# Patient Record
Sex: Male | Born: 1945 | Race: White | Hispanic: No | Marital: Married | Smoking: Former smoker
Health system: Southern US, Community
[De-identification: ages and names within clinical notes are randomized; demographics above are authoritative.]

## PROBLEM LIST (undated history)

## (undated) DIAGNOSIS — K219 Gastro-esophageal reflux disease without esophagitis: Secondary | ICD-10-CM

## (undated) DIAGNOSIS — M1990 Unspecified osteoarthritis, unspecified site: Secondary | ICD-10-CM

## (undated) DIAGNOSIS — S46311A Strain of muscle, fascia and tendon of triceps, right arm, initial encounter: Secondary | ICD-10-CM

## (undated) DIAGNOSIS — C61 Malignant neoplasm of prostate: Secondary | ICD-10-CM

## (undated) DIAGNOSIS — I10 Essential (primary) hypertension: Secondary | ICD-10-CM

## (undated) DIAGNOSIS — F419 Anxiety disorder, unspecified: Secondary | ICD-10-CM

## (undated) HISTORY — PX: PROSTATE SURGERY: SHX751

## (undated) HISTORY — PX: KNEE ARTHROSCOPY: SUR90

## (undated) HISTORY — PX: TONSILLECTOMY: SUR1361

## (undated) HISTORY — PX: NASAL SINUS SURGERY: SHX719

---

## 2003-06-20 ENCOUNTER — Other Ambulatory Visit: Admission: RE | Admit: 2003-06-20 | Discharge: 2003-06-20 | Payer: Self-pay | Admitting: Dermatology

## 2008-02-05 ENCOUNTER — Encounter (INDEPENDENT_AMBULATORY_CARE_PROVIDER_SITE_OTHER): Payer: Self-pay | Admitting: Urology

## 2011-02-28 ENCOUNTER — Emergency Department (HOSPITAL_COMMUNITY): Payer: Medicare Other

## 2011-02-28 ENCOUNTER — Emergency Department (HOSPITAL_COMMUNITY)
Admission: EM | Admit: 2011-02-28 | Discharge: 2011-02-28 | Disposition: A | Discharge status: Home / Self Care | Payer: Medicare Other | Attending: Emergency Medicine | Admitting: Emergency Medicine

## 2011-02-28 DIAGNOSIS — Y92009 Unspecified place in unspecified non-institutional (private) residence as the place of occurrence of the external cause: Secondary | ICD-10-CM | POA: Insufficient documentation

## 2011-02-28 DIAGNOSIS — I10 Essential (primary) hypertension: Secondary | ICD-10-CM | POA: Insufficient documentation

## 2011-02-28 DIAGNOSIS — S93609A Unspecified sprain of unspecified foot, initial encounter: Secondary | ICD-10-CM | POA: Insufficient documentation

## 2011-02-28 DIAGNOSIS — S93409A Sprain of unspecified ligament of unspecified ankle, initial encounter: Secondary | ICD-10-CM | POA: Insufficient documentation

## 2011-02-28 DIAGNOSIS — W208XXA Other cause of strike by thrown, projected or falling object, initial encounter: Secondary | ICD-10-CM | POA: Insufficient documentation

## 2011-12-16 ENCOUNTER — Ambulatory Visit
Admission: RE | Admit: 2011-12-16 | Discharge: 2011-12-16 | Disposition: A | Discharge status: Home / Self Care | Payer: Medicare Other | Source: Ambulatory Visit | Attending: Neurology | Admitting: Neurology

## 2011-12-16 ENCOUNTER — Other Ambulatory Visit: Payer: Self-pay | Admitting: Neurology

## 2011-12-16 DIAGNOSIS — R52 Pain, unspecified: Secondary | ICD-10-CM

## 2012-01-27 ENCOUNTER — Emergency Department (HOSPITAL_COMMUNITY): Payer: Medicare Other

## 2012-01-27 ENCOUNTER — Other Ambulatory Visit: Payer: Self-pay

## 2012-01-27 ENCOUNTER — Emergency Department (HOSPITAL_COMMUNITY)
Admission: EM | Admit: 2012-01-27 | Discharge: 2012-01-27 | Disposition: A | Discharge status: Home / Self Care | Payer: Medicare Other | Attending: Emergency Medicine | Admitting: Emergency Medicine

## 2012-01-27 ENCOUNTER — Encounter (HOSPITAL_COMMUNITY): Payer: Self-pay

## 2012-01-27 DIAGNOSIS — R0602 Shortness of breath: Secondary | ICD-10-CM | POA: Insufficient documentation

## 2012-01-27 DIAGNOSIS — J984 Other disorders of lung: Secondary | ICD-10-CM | POA: Insufficient documentation

## 2012-01-27 DIAGNOSIS — J45909 Unspecified asthma, uncomplicated: Secondary | ICD-10-CM | POA: Insufficient documentation

## 2012-01-27 DIAGNOSIS — R0609 Other forms of dyspnea: Secondary | ICD-10-CM | POA: Insufficient documentation

## 2012-01-27 DIAGNOSIS — I10 Essential (primary) hypertension: Secondary | ICD-10-CM | POA: Insufficient documentation

## 2012-01-27 DIAGNOSIS — R0600 Dyspnea, unspecified: Secondary | ICD-10-CM

## 2012-01-27 DIAGNOSIS — R0989 Other specified symptoms and signs involving the circulatory and respiratory systems: Secondary | ICD-10-CM | POA: Insufficient documentation

## 2012-01-27 HISTORY — DX: Essential (primary) hypertension: I10

## 2012-01-27 HISTORY — DX: Malignant neoplasm of prostate: C61

## 2012-01-27 LAB — BLOOD GAS, ARTERIAL
Bicarbonate: 23.4 mEq/L (ref 20.0–24.0)
O2 Saturation: 95.9 %
pH, Arterial: 7.443 (ref 7.350–7.450)
pO2, Arterial: 81.6 mmHg (ref 80.0–100.0)

## 2012-01-27 LAB — DIFFERENTIAL
Eosinophils Relative: 2 % (ref 0–5)
Lymphocytes Relative: 30 % (ref 12–46)
Lymphs Abs: 2.7 10*3/uL (ref 0.7–4.0)
Monocytes Relative: 5 % (ref 3–12)

## 2012-01-27 LAB — BASIC METABOLIC PANEL
CO2: 26 mEq/L (ref 19–32)
Calcium: 9.4 mg/dL (ref 8.4–10.5)
Glucose, Bld: 107 mg/dL — ABNORMAL HIGH (ref 70–99)
Sodium: 134 mEq/L — ABNORMAL LOW (ref 135–145)

## 2012-01-27 LAB — TROPONIN I: Troponin I: <0.3 ng/mL (ref <0.30)

## 2012-01-27 LAB — URINALYSIS, ROUTINE W REFLEX MICROSCOPIC
Glucose, UA: NEGATIVE mg/dL
Hgb urine dipstick: NEGATIVE
Specific Gravity, Urine: 1.015 (ref 1.005–1.030)
pH: 6 (ref 5.0–8.0)

## 2012-01-27 LAB — CBC
HCT: 42.5 % (ref 39.0–52.0)
Hemoglobin: 14.6 g/dL (ref 13.0–17.0)
MCV: 92.8 fL (ref 78.0–100.0)
Platelets: 338 10*3/uL (ref 150–400)
RBC: 4.58 MIL/uL (ref 4.22–5.81)
WBC: 8.8 10*3/uL (ref 4.0–10.5)

## 2012-01-27 MED ORDER — SODIUM CHLORIDE 0.9 % IV SOLN
INTRAVENOUS | Status: DC
  Administered 2012-01-27: 18:00:00 via INTRAVENOUS

## 2012-01-27 MED ORDER — IPRATROPIUM BROMIDE 0.02 % IN SOLN
0.5000 mg | Freq: Once | RESPIRATORY_TRACT | Status: DC
  Filled 2012-01-27: qty 2.5

## 2012-01-27 MED ORDER — ALBUTEROL SULFATE (5 MG/ML) 0.5% IN NEBU
5.0000 mg | INHALATION_SOLUTION | Freq: Once | RESPIRATORY_TRACT | Status: DC
  Filled 2012-01-27: qty 1

## 2012-01-27 NOTE — ED Notes (Signed)
Started prednisone last week, has since stopped taking, thinks may his breathing worse.  "just not felt right all weekend", denies any n/v, no cp, denies cough/fever

## 2012-01-27 NOTE — ED Notes (Signed)
Ambulated pt around nurses station, lowest O2 level was 98%.

## 2012-01-27 NOTE — ED Notes (Signed)
Received report from Alfredo Martinez RN

## 2012-01-27 NOTE — ED Notes (Signed)
Discharge instructions reviewed with pt, questions answered. Pt verbalized understanding.  

## 2012-01-27 NOTE — ED Provider Notes (Signed)
History     CSN: [id number]  Arrival date & time 01/27/12  1743   First MD Initiated Contact with Patient 01/27/12 1801      Chief Complaint  Patient presents with  . Shortness of Breath     HPI Pt was seen at 1805.  Per pt, c/o gradual onset and persistence of constant SOB for the past several days.  Pt describes the SOB as "I feel like I can't take a deep breath" and "I just don't feel right."  Denies specific SOB however.  Pt states he had been taking prednisone for the past week and this symptom began after he stopped it (just before the weekend).  Denies CP/palpitations, no cough, no abd pain, no back pain, no rash, no fevers, no calf/LE pain or unilateral swelling, no visual changes, no focal motor weakness, no tingling/numbness in extremities.       Past Medical History  Diagnosis Date  . Hypertension   . Asthma   . Prostate ca     Past Surgical History  Procedure Date  . Prostate surgery     History  Substance Use Topics  . Smoking status: Never Smoker   . Smokeless tobacco: Not on file  . Alcohol Use: Yes     occ    Review of Systems ROS: Statement: All systems negative except as marked or noted in the HPI; Constitutional: Negative for fever and chills. ; ; Eyes: Negative for eye pain, redness and discharge. ; ; ENMT: Negative for ear pain, hoarseness, nasal congestion, sinus pressure and sore throat. ; ; Cardiovascular: +SOB. Negative for chest pain, palpitations, diaphoresis, and peripheral edema. ; ; Respiratory: Negative for cough, wheezing and stridor. ; ; Gastrointestinal: Negative for nausea, vomiting, diarrhea, abdominal pain, blood in stool, hematemesis, jaundice and rectal bleeding. . ; ; Genitourinary: Negative for dysuria, flank pain and hematuria. ; ; Musculoskeletal: Negative for back pain and neck pain. Negative for swelling and trauma.; ; Skin: Negative for pruritus, rash, abrasions, blisters, bruising and skin lesion.; ; Neuro: Negative for headache,  lightheadedness and neck stiffness. Negative for weakness, altered level of consciousness , altered mental status, extremity weakness, paresthesias, involuntary movement, seizure and syncope.     Allergies  Penicillins  Home Medications  No current outpatient prescriptions on file.  BP 146/78  Pulse 79  Temp(Src) 97.4 F (36.3 C) (Oral)  Resp 20  Ht 5\' 11"  (1.803 m)  Wt 210 lb (95.255 kg)  BMI 29.29 kg/m2  SpO2 98%  Physical Exam 1810: Physical examination:  Nursing notes reviewed; Vital signs and O2 SAT reviewed;  Constitutional: Well developed, Well nourished, Well hydrated, In no acute distress; Head:  Normocephalic, atraumatic; Eyes: EOMI, PERRL, No scleral icterus; ENMT: Mouth and pharynx normal, Mucous membranes moist; Neck: Supple, Full range of motion, No lymphadenopathy; Cardiovascular: Regular rate and rhythm, No murmur, rub, or gallop; Respiratory: Breath sounds clear & equal bilaterally, rare wheeze, no audible wheezing, speaking full sentences with ease. Normal respiratory effort/excursion; Chest: Nontender, Movement normal; Abdomen: Soft, Nontender, Nondistended, Normal bowel sounds; Genitourinary: No CVA tenderness; Extremities: Pulses normal, No tenderness, No edema, No calf edema or asymmetry.; Neuro: AA&Ox3, Major CN grossly intact. Speech clear, no facial droop, normal coordination, gait steady. No gross focal motor or sensory deficits in extremities.; Skin: Color normal, Warm, Dry, no rash.    ED Course  Procedures    MDM  MDM Reviewed: nursing note and vitals Interpretation: ECG, labs, x-ray and CT scan  Date: 01/27/2012  Rate: 70  Rhythm: normal sinus rhythm and sinus arrhythmia  QRS Axis: left  Intervals: normal  ST/T Wave abnormalities: normal  Conduction Disutrbances:right bundle branch block  Narrative Interpretation:   Old EKG Reviewed: none available.  Results for orders placed during the hospital encounter of 01/27/12  TROPONIN I       Component Value Range   Troponin I <0.30  <0.30 (ng/mL)  URINALYSIS, ROUTINE W REFLEX MICROSCOPIC      Component Value Range   Color, Urine YELLOW  YELLOW    APPearance CLEAR  CLEAR    Specific Gravity, Urine 1.015  1.005 - 1.030    pH 6.0  5.0 - 8.0    Glucose, UA NEGATIVE  NEGATIVE (mg/dL)   Hgb urine dipstick NEGATIVE  NEGATIVE    Bilirubin Urine NEGATIVE  NEGATIVE    Ketones, ur NEGATIVE  NEGATIVE (mg/dL)   Protein, ur NEGATIVE  NEGATIVE (mg/dL)   Urobilinogen, UA 0.2  0.0 - 1.0 (mg/dL)   Nitrite NEGATIVE  NEGATIVE    Leukocytes, UA NEGATIVE  NEGATIVE   CBC      Component Value Range   WBC 8.8  4.0 - 10.5 (K/uL)   RBC 4.58  4.22 - 5.81 (MIL/uL)   Hemoglobin 14.6  13.0 - 17.0 (g/dL)   HCT 04.5  40.9 - 81.1 (%)   MCV 92.8  78.0 - 100.0 (fL)   MCH 31.9  26.0 - 34.0 (pg)   MCHC 34.4  30.0 - 36.0 (g/dL)   RDW 91.4  78.2 - 95.6 (%)   Platelets 338  150 - 400 (K/uL)  DIFFERENTIAL      Component Value Range   Neutrophils Relative 63  43 - 77 (%)   Neutro Abs 5.5  1.7 - 7.7 (K/uL)   Lymphocytes Relative 30  12 - 46 (%)   Lymphs Abs 2.7  0.7 - 4.0 (K/uL)   Monocytes Relative 5  3 - 12 (%)   Monocytes Absolute 0.4  0.1 - 1.0 (K/uL)   Eosinophils Relative 2  0 - 5 (%)   Eosinophils Absolute 0.2  0.0 - 0.7 (K/uL)   Basophils Relative 0  0 - 1 (%)   Basophils Absolute 0.0  0.0 - 0.1 (K/uL)  BASIC METABOLIC PANEL      Component Value Range   Sodium 134 (*) 135 - 145 (mEq/L)   Potassium 4.2  3.5 - 5.1 (mEq/L)   Chloride 99  96 - 112 (mEq/L)   CO2 26  19 - 32 (mEq/L)   Glucose, Bld 107 (*) 70 - 99 (mg/dL)   BUN 22  6 - 23 (mg/dL)   Creatinine, Ser 2.13  0.50 - 1.35 (mg/dL)   Calcium 9.4  8.4 - 08.6 (mg/dL)   GFR calc non Af Amer 68 (*) >90 (mL/min)   GFR calc Af Amer 79 (*) >90 (mL/min)  D-DIMER, QUANTITATIVE      Component Value Range   D-Dimer, Quant 0.23  0.00 - 0.48 (ug/mL-FEU)  BLOOD GAS, ARTERIAL      Component Value Range   Delivery systems ROOM AIR     pH, Arterial  7.443  7.350 - 7.450    pCO2 arterial 34.8 (*) 35.0 - 45.0 (mmHg)   pO2, Arterial 81.6  80.0 - 100.0 (mmHg)   Bicarbonate 23.4  20.0 - 24.0 (mEq/L)   TCO2 20.0  0 - 100 (mmol/L)   Acid-base deficit 0.2  0.0 - 2.0 (mmol/L)   O2 Saturation 95.9  Patient temperature 37.0     Collection site RIGHT RADIAL     Drawn by [id number]     Sample type ARTERIAL     Allens test (pass/fail) PASS  PASS    Dg Chest 2 View 01/27/2012  *RADIOLOGY REPORT*  Clinical Data: Shortness of breath.  CHEST - 2 VIEW  Comparison: None.  Findings: Lungs are hyperexpanded. Interstitial markings are diffusely coarsened with chronic features.  Biapical pleural thickening is symmetric, likely related to scarring. Tiny nodular densities seen in the right costophrenic angle.  Cardiopericardial silhouette is at upper limits of normal for size. Bones are diffusely demineralized. Telemetry leads overlie the chest.  IMPRESSION: Chronic interstitial changes with biapical pleuroparenchymal scarring.  Tiny nodular density in the right cardiophrenic angle.  This may be related to underlying interstitial disease or scarring. CT chest without contrast could be used to further evaluate.  Original Report Authenticated By: ERIC A. MANSELL, M.D.   Ct Chest Wo Contrast 01/27/2012  *RADIOLOGY REPORT*  Clinical Data: Shortness of breath.  Prostate cancer. Nodule in the right lung base laterally on chest x-ray.  CT CHEST WITHOUT CONTRAST  Technique:  Multidetector CT imaging of the chest was performed following the standard protocol without IV contrast.  Comparison: Chest x-ray dated 01/27/2012  Findings: The lungs are clear.  The density seen at the right lung base laterally on the chest x-ray represents a sclerotic lesion in the lateral aspect of the right seventh rib.  This is seen on image number 50 of series 3.  There is also a sclerotic lesion in the lateral aspect of the right fourth rib.  These may represent benign bone islands but in this patient  with prostate cancer history, the possibility of sclerotic metastases should be considered.  The patient has a small posterior medial diaphragmatic hernia containing only fat at the left lung base posteriorly.  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  IMPRESSION:  1.  No abnormality of the lungs. 2.  Sclerotic lesions in the right fourth and seventh ribs.  The seventh rib lesion accounts for the nodular appearing density on chest x-ray.  These probably represent benign bone islands but in this patient with prostate cancer history, sclerotic metastases could give the same appearance.  Original Report Authenticated By: Gwynn Burly, M.D.      11:18 PM:  Pt states he feels "much better now" after neb tx, has ambulated in the ED with Sats remaining 98-99% R/A, resps easy, gait steady.  VS remain stable, no hypoxia, no tachypnea, no tachycardia.  Denies SOB/CP/abd pain/back pain specifically.  No calf/LE pain or unilateral swelling.  Pt is breathing full and deep while in the ED, lungs CTA bilat, NAD, speaking full sentences with ease.  Doubt PE or ACS as cause for symptoms given constant symptoms for the past several days with normal troponin, d-dimer and without acute STTW changes on EKG.  Wells Criteria scores low prob for PE.  Pt has hx of asthma and states he feels "a lot better now" after 1 nebulizer treatment, he wants to go home and he does not want any further testing in the ED tonight.  Wife wants to take pt home.  D/W pt and wife re: CT findings above.  Endorses he already has an appt with his Prostate CA doctor at [facility] this month to be scheduled for a NM bone scan for this year (they have been negative to date).  States he can call  this doctor tomorrow with CT results and attempt to get the bone scan moved up to further delineate the rib/bone lesions.  Dx testing d/w pt and family.  Questions answered.  Verb understanding, agreeable to d/c home with outpt f/u.             Laray Anger, DO 01/30/12 1357

## 2012-01-27 NOTE — Discharge Instructions (Signed)
RESOURCE GUIDE  Dental Problems  Patients with Medicaid: [facility]                     [facility] [address]                                           [address] Phone:  [phone]                                                  Phone:  [phone]  If unable to pay or uninsured, contact:  [facility] or [facility] to become qualified for the adult dental clinic.  Chronic Pain Problems Contact [facility]  [phone] Patients need to be referred by their primary care doctor.  Insufficient Money for Medicine Contact [employer]:  call "211" or [facility] 271-5999.  No Primary Care Doctor Call [facility]  832-8000 Other agencies that provide inexpensive medical care    [facility]  [phone]    [facility]  [phone]    [facility]  [phone]    Women's Clinic  [phone]    [facility]  [phone]    [facility]  [phone]  Psychological Services [facility]  [phone] [facility]  [phone] [facility]   [phone] (emergency services [phone])  Substance Abuse Resources [facility]  336-882-2125 [facility] 336-784-9470 [facility] 336-285-9073 [facility] 336-845-3988 Residential & Outpatient Substance Abuse Program  800-659-3381  Abuse/Neglect [place] Child Abuse Hotline (336) 641-3795 [place] Child Abuse Hotline 800-378-5315 (After Hours)  Emergency Shelter [facility] (336) 271-5985  Maternity Homes [facility] (336) 275-9566 [facility] (704) 372-4663  MRSA Hotline #:   832-7006    [place] [facility]  [facility] [place]     [employer]                          [facility] [address] [place]                       [address]      [address]  [place]                                                [place]                            [place] Phone:  [phone]                                   Phone:  [phone]                 Phone:  [phone]  [facility] Phone:  [phone]    [facility] Child Abuse Hotline 574-611-6536 708-026-8184 (After Hours)    Take your usual prescriptions as previously directed.  Your chest CT scan showed:  "Sclerotic lesions in the right fourth and seventh ribs.  These probably represent benign bone islands but in this patient with prostate cancer history, sclerotic metastases could give the same appearance."   Call your regular medical doctor and your Prostate doctor at [facility] tomorrow morning to schedule a follow up appointment this week to follow up on your CT scan findings.  Return to the Emergency Department immediately sooner if worsening.

## 2012-01-28 LAB — URINE CULTURE

## 2012-02-13 DIAGNOSIS — N529 Male erectile dysfunction, unspecified: Secondary | ICD-10-CM | POA: Insufficient documentation

## 2012-02-13 DIAGNOSIS — N393 Stress incontinence (female) (male): Secondary | ICD-10-CM | POA: Insufficient documentation

## 2012-02-13 DIAGNOSIS — C61 Malignant neoplasm of prostate: Secondary | ICD-10-CM | POA: Insufficient documentation

## 2012-02-16 ENCOUNTER — Encounter (HOSPITAL_COMMUNITY): Payer: Self-pay

## 2012-02-16 ENCOUNTER — Emergency Department (HOSPITAL_COMMUNITY)
Admission: EM | Admit: 2012-02-16 | Discharge: 2012-02-16 | Disposition: A | Discharge status: Home / Self Care | Payer: Medicare Other | Attending: Emergency Medicine | Admitting: Emergency Medicine

## 2012-02-16 DIAGNOSIS — Z880 Allergy status to penicillin: Secondary | ICD-10-CM | POA: Insufficient documentation

## 2012-02-16 DIAGNOSIS — H5710 Ocular pain, unspecified eye: Secondary | ICD-10-CM | POA: Insufficient documentation

## 2012-02-16 DIAGNOSIS — J45909 Unspecified asthma, uncomplicated: Secondary | ICD-10-CM | POA: Insufficient documentation

## 2012-02-16 DIAGNOSIS — I10 Essential (primary) hypertension: Secondary | ICD-10-CM | POA: Insufficient documentation

## 2012-02-16 DIAGNOSIS — S058X9A Other injuries of unspecified eye and orbit, initial encounter: Secondary | ICD-10-CM

## 2012-02-16 DIAGNOSIS — Z8546 Personal history of malignant neoplasm of prostate: Secondary | ICD-10-CM | POA: Insufficient documentation

## 2012-02-16 MED ORDER — TETRACAINE HCL 0.5 % OP SOLN
OPHTHALMIC | Status: AC
  Administered 2012-02-16: 09:00:00
  Filled 2012-02-16: qty 2

## 2012-02-16 MED ORDER — FLUORESCEIN SODIUM 1 MG OP STRP
ORAL_STRIP | OPHTHALMIC | Status: AC
  Administered 2012-02-16: 09:00:00
  Filled 2012-02-16: qty 1

## 2012-02-16 NOTE — Discharge Instructions (Signed)
Eye Injury   The eye can be injured by scratches, foreign bodies, contact lenses, very bright light (welding torches), and chemical irritation. The cornea (the clear part of the eye) is very sensitive; even minor injuries to it are painful. Most injuries to the cornea heal in 2-4 days. Treatment may include:   Antibiotic eye drops or ointment may be needed to soothe the eye and prevent infection. Drops that numb the eye (anesthetic drops) should not be used repeatedly as they can delay healing. Drops to dilate the pupil for 1-2 days are sometimes used to relieve pain.   Patching the eye can reduce irritation from blinking and bright light. When your eye is patched, you should not drive or operate machinery because your side vision and your ability to judge distances are decreased.   Rest your eye. Stay in a darkened room and wear sunglasses to reduce the irritation from light.   Do not rub your eye for the next 2 weeks to allow complete healing.  If you have contact lenses, do not wear them until your caregiver says it is safe to do so. Pain medicine may also be needed for 1-2 days.   SEEK MEDICAL CARE IF:   You have increased pain, persistent irritation or blurred vision over the next 2 days.   Your symptoms are getting worse or not improving.   You have any other questions or concerns regarding your injury.  Document Released: 12/19/2004 Document Revised: 10/31/2011 Document Reviewed: 11/11/2005   ExitCare Patient Information 2012 [employer].

## 2012-02-16 NOTE — ED Provider Notes (Signed)
History   This chart was scribed for Roberto Gaskins, MD by Melba Coon. The patient was seen in room APA17/APA17 and the patient's care was started at 8:55AM.    CSN: [id number]  Arrival date & time 02/16/12  0847   First MD Initiated Contact with Patient 02/16/12 [phone]      Chief complaint - eye pain   HPI ZLATAN HORNBACK is a 66 y.o. male who presents to the Emergency Department complaining of constant, burning, moderate to severe right orbital pain with an onset 45 minutes ago. Pt was getting ready for church when he accidentally put 1 drop of earwax removal in his eye instead of eye drops. Pt stated it immediately started to burn and tried to flush his eye out with water. Pt does not wear contacts. Blurriness present. No HA, fever, chills, CP, or n/v/d. No Hx of eye surgeries. Hx of HTN and asthma. No other pertinent medical problems.  Past Medical History  Diagnosis Date  . Hypertension   . Asthma   . Prostate ca     Past Surgical History  Procedure Date  . Prostate surgery     No family history on file.  History  Substance Use Topics  . Smoking status: Never Smoker   . Smokeless tobacco: Not on file  . Alcohol Use: Yes     occ      Review of Systems 10 Systems reviewed and are negative for acute change except as noted in the HPI.  Allergies  Penicillins  Home Medications   Current Outpatient Rx  Name Route Sig Dispense Refill  . ALPRAZOLAM 0.5 MG PO TABS Oral Take 0.5 mg by mouth 2 (two) times daily as needed. Nerves/sleep    . AZELASTINE HCL 137 MCG/SPRAY NA SOLN Nasal Place 1 spray into the nose 2 (two) times daily. Use in each nostril as directed    . BENAZEPRIL HCL 20 MG PO TABS Oral Take 20 mg by mouth daily.    . COLESEVELAM HCL 625 MG PO TABS Oral Take 1,875 mg by mouth 2 (two) times daily.    Marland Kitchen FLUTICASONE-SALMETEROL 250-50 MCG/DOSE IN AEPB Inhalation Inhale 1 puff into the lungs every 12 (twelve) hours.    Marland Kitchen HYDROCODONE-ACETAMINOPHEN 5-500 MG  PO TABS Oral Take 1 tablet by mouth every 8 (eight) hours as needed. Pain patient taking for 10 days    . MELOXICAM 15 MG PO TABS Oral Take 15 mg by mouth daily.      BP 146/85  Pulse 80  Resp 20  Ht 5\' 11"  (1.803 m)  Wt 210 lb (95.255 kg)  BMI 29.29 kg/m2   Physical Exam  CONSTITUTIONAL: Well developed/well nourished HEAD AND FACE: Normocephalic/atraumatic EYES: EOMI, right eye: conjectival erythema w/ whitish d/c.  Right pupil is reactive/regular in appearance No abrasion.  PH equal to OS (between 7-8).  No foreign body noted.  No hyphema/hypopyon.  No consensual pain noted.  ENMT: Mucous membranes moist NECK: supple no meningeal signs SPINE:entire spine nontender CV: S1/S2 noted, no murmurs/rubs/gallops noted LUNGS: Lungs are clear to auscultation bilaterally, no apparent distress ABDOMEN: soft, nontender, no rebound or guarding GU:no cva tenderness NEURO: Pt is awake/alert, moves all extremitiesx4 EXTREMITIES: pulses normal, full ROM SKIN: warm, color normal PSYCH: no abnormalities of mood noted   ED Course  Procedures  COORDINATION OF CARE:  9:00AM - EDMD will determine if there are any abrasions in the eye; will also flush the eye out with morgan lens and determine  what chemicals were in the earwax removal.   9:45AM - recheck; EDMD performs an eye exam Pt improved D/w dr Lita Mains, on call for eye, likely had peroxide component, no further testing/treatment necessary     MDM  Nursing notes reviewed and considered in documentation    I personally performed the services described in this documentation, which was scribed in my presence. The recorded information has been reviewed and considered.          Roberto Gaskins, MD 02/16/12 1004

## 2012-02-16 NOTE — ED Notes (Signed)
Pt states he got ear wax remover in his eye.

## 2013-01-24 IMAGING — CR DG CHEST 2V
2 series · 2 of 2 positions shown · non-contrast
Comparison: None.

CLINICAL DATA: Shortness of breath.

CHEST - 2 VIEW

[view not recorded (1 of 2)]
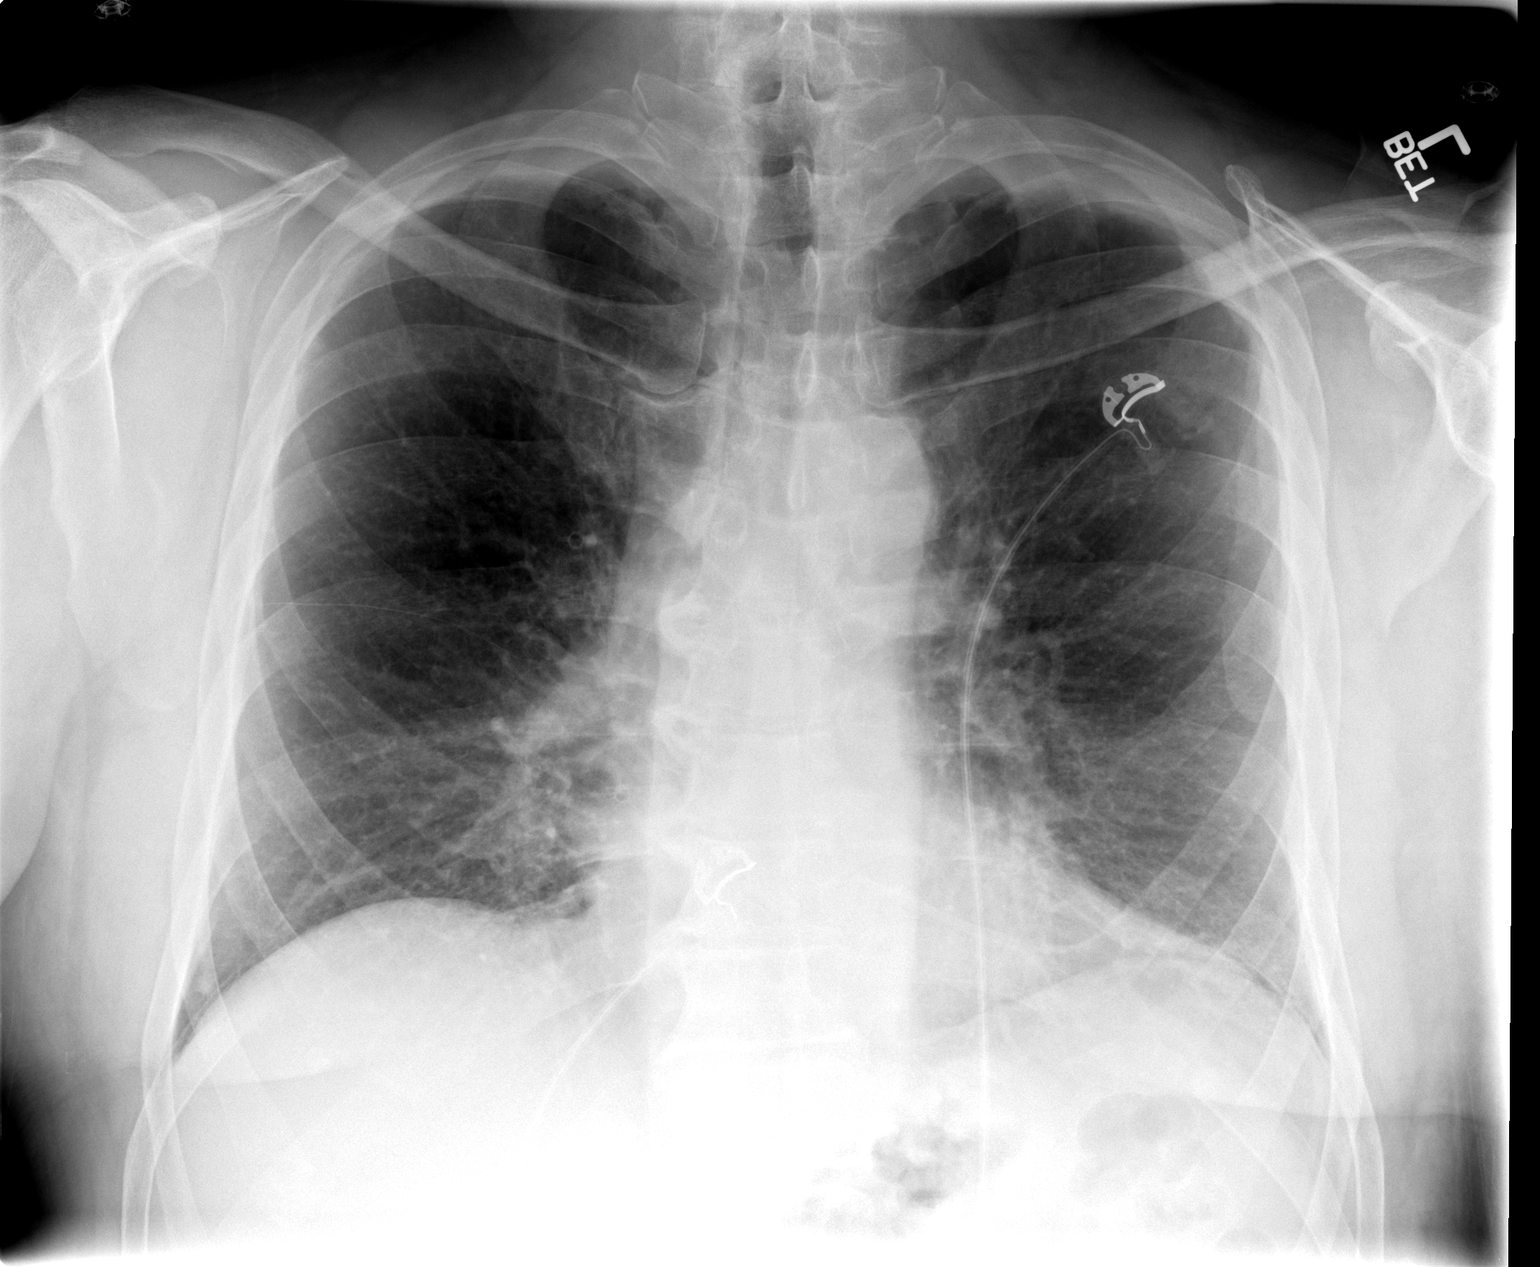

[view not recorded (2 of 2)]
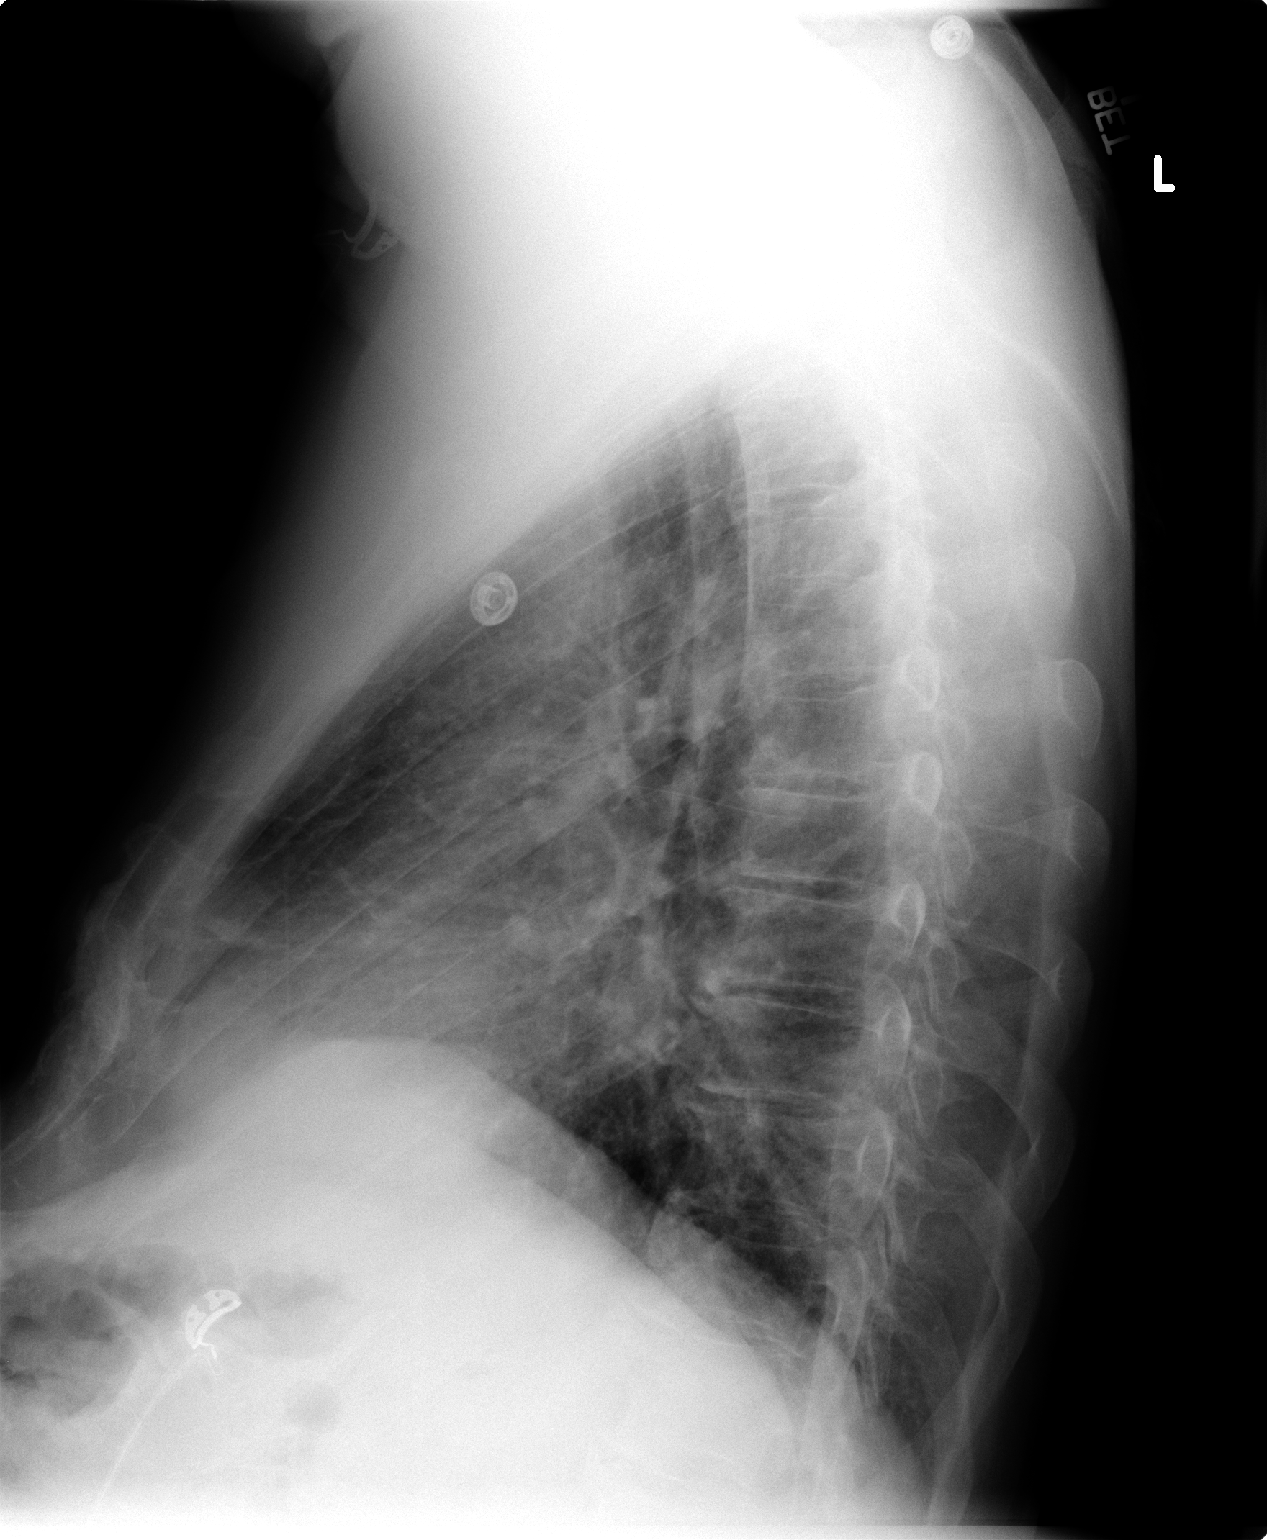

[2 of 2 positions shown; findings below may reference images not displayed]

FINDINGS: Lungs are hyperexpanded. Interstitial markings are
diffusely coarsened with chronic features.  Biapical pleural
thickening is symmetric, likely related to scarring. Tiny nodular
densities seen in the right costophrenic angle.  Cardiopericardial
silhouette is at upper limits of normal for size. Bones are
diffusely demineralized. Telemetry leads overlie the chest.
IMPRESSION: Chronic interstitial changes with biapical pleuroparenchymal
scarring.

Tiny nodular density in the right cardiophrenic angle.  This may be
related to underlying interstitial disease or scarring. CT chest
without contrast could be used to further evaluate.

## 2013-10-04 DIAGNOSIS — C61 Malignant neoplasm of prostate: Secondary | ICD-10-CM | POA: Insufficient documentation

## 2015-03-19 ENCOUNTER — Inpatient Hospital Stay (HOSPITAL_COMMUNITY)
Admission: EM | Admit: 2015-03-19 | Discharge: 2015-03-20 | DRG: 149 | Disposition: A | Discharge status: Home / Self Care | Payer: Medicare Other | Attending: Internal Medicine | Admitting: Internal Medicine

## 2015-03-19 ENCOUNTER — Emergency Department (HOSPITAL_COMMUNITY): Payer: Medicare Other

## 2015-03-19 ENCOUNTER — Encounter (HOSPITAL_COMMUNITY): Payer: Self-pay | Admitting: Emergency Medicine

## 2015-03-19 DIAGNOSIS — H81399 Other peripheral vertigo, unspecified ear: Secondary | ICD-10-CM | POA: Diagnosis not present

## 2015-03-19 DIAGNOSIS — Z800 Family history of malignant neoplasm of digestive organs: Secondary | ICD-10-CM

## 2015-03-19 DIAGNOSIS — F419 Anxiety disorder, unspecified: Secondary | ICD-10-CM | POA: Diagnosis present

## 2015-03-19 DIAGNOSIS — I10 Essential (primary) hypertension: Secondary | ICD-10-CM | POA: Diagnosis present

## 2015-03-19 DIAGNOSIS — Z8673 Personal history of transient ischemic attack (TIA), and cerebral infarction without residual deficits: Secondary | ICD-10-CM | POA: Diagnosis not present

## 2015-03-19 DIAGNOSIS — Z8546 Personal history of malignant neoplasm of prostate: Secondary | ICD-10-CM | POA: Diagnosis not present

## 2015-03-19 DIAGNOSIS — E785 Hyperlipidemia, unspecified: Secondary | ICD-10-CM | POA: Diagnosis present

## 2015-03-19 DIAGNOSIS — R42 Dizziness and giddiness: Secondary | ICD-10-CM

## 2015-03-19 DIAGNOSIS — Z8249 Family history of ischemic heart disease and other diseases of the circulatory system: Secondary | ICD-10-CM | POA: Diagnosis not present

## 2015-03-19 DIAGNOSIS — Z87891 Personal history of nicotine dependence: Secondary | ICD-10-CM

## 2015-03-19 DIAGNOSIS — J45909 Unspecified asthma, uncomplicated: Secondary | ICD-10-CM | POA: Diagnosis present

## 2015-03-19 HISTORY — DX: Anxiety disorder, unspecified: F41.9

## 2015-03-19 LAB — COMPREHENSIVE METABOLIC PANEL
ALBUMIN: 4.1 g/dL (ref 3.5–5.2)
ALK PHOS: 64 U/L (ref 39–117)
ALT: 29 U/L (ref 0–53)
AST: 24 U/L (ref 0–37)
Anion gap: 6 (ref 5–15)
BILIRUBIN TOTAL: 0.7 mg/dL (ref 0.3–1.2)
BUN: 18 mg/dL (ref 6–23)
CO2: 29 mmol/L (ref 19–32)
CREATININE: 0.83 mg/dL (ref 0.50–1.35)
Calcium: 9 mg/dL (ref 8.4–10.5)
Chloride: 104 mmol/L (ref 96–112)
GFR calc Af Amer: >90 mL/min (ref >90)
GFR, EST NON AFRICAN AMERICAN: 88 mL/min — AB (ref >90)
GLUCOSE: 109 mg/dL — AB (ref 70–99)
Potassium: 4.1 mmol/L (ref 3.5–5.1)
Sodium: 139 mmol/L (ref 135–145)
TOTAL PROTEIN: 7 g/dL (ref 6.0–8.3)

## 2015-03-19 LAB — DIFFERENTIAL
Basophils Absolute: 0 10*3/uL (ref 0.0–0.1)
Basophils Relative: 0 % (ref 0–1)
EOS ABS: 0.1 10*3/uL (ref 0.0–0.7)
Eosinophils Relative: 1 % (ref 0–5)
LYMPHS PCT: 18 % (ref 12–46)
Lymphs Abs: 2 10*3/uL (ref 0.7–4.0)
MONO ABS: 0.5 10*3/uL (ref 0.1–1.0)
Monocytes Relative: 5 % (ref 3–12)
NEUTROS PCT: 76 % (ref 43–77)
Neutro Abs: 8.6 10*3/uL — ABNORMAL HIGH (ref 1.7–7.7)

## 2015-03-19 LAB — CBC
HCT: 45.6 % (ref 39.0–52.0)
Hemoglobin: 15.7 g/dL (ref 13.0–17.0)
MCH: 32.9 pg (ref 26.0–34.0)
MCHC: 34.4 g/dL (ref 30.0–36.0)
MCV: 95.6 fL (ref 78.0–100.0)
PLATELETS: 332 10*3/uL (ref 150–400)
RBC: 4.77 MIL/uL (ref 4.22–5.81)
RDW: 13.1 % (ref 11.5–15.5)
WBC: 11.2 10*3/uL — ABNORMAL HIGH (ref 4.0–10.5)

## 2015-03-19 LAB — APTT: APTT: 27 s (ref 24–37)

## 2015-03-19 LAB — PROTIME-INR
INR: 0.87 (ref 0.00–1.49)
PROTHROMBIN TIME: 11.9 s (ref 11.6–15.2)

## 2015-03-19 LAB — I-STAT TROPONIN, ED: TROPONIN I, POC: 0 ng/mL (ref 0.00–0.08)

## 2015-03-19 LAB — ETHANOL

## 2015-03-19 MED ORDER — MOMETASONE FURO-FORMOTEROL FUM 100-5 MCG/ACT IN AERO
2.0000 | INHALATION_SPRAY | Freq: Two times a day (BID) | RESPIRATORY_TRACT | Status: DC
  Administered 2015-03-19: 2 via RESPIRATORY_TRACT
  Filled 2015-03-19: qty 8.8

## 2015-03-19 MED ORDER — MECLIZINE HCL 12.5 MG PO TABS
25.0000 mg | ORAL_TABLET | Freq: Once | ORAL | Status: AC
  Administered 2015-03-19: 25 mg via ORAL
  Filled 2015-03-19: qty 2

## 2015-03-19 MED ORDER — ONDANSETRON HCL 4 MG PO TABS: 4.0000 mg | ORAL_TABLET | Freq: Four times a day (QID) | ORAL | Status: DC | PRN

## 2015-03-19 MED ORDER — AZELASTINE HCL 0.1 % NA SOLN
1.0000 | Freq: Two times a day (BID) | NASAL | Status: DC
  Administered 2015-03-19 – 2015-03-20 (×2): 1 via NASAL
  Filled 2015-03-19: qty 30

## 2015-03-19 MED ORDER — BENAZEPRIL HCL 10 MG PO TABS
20.0000 mg | ORAL_TABLET | Freq: Every day | ORAL | Status: DC
  Administered 2015-03-20: 20 mg via ORAL
  Filled 2015-03-19 (×2): qty 2

## 2015-03-19 MED ORDER — ADULT MULTIVITAMIN W/MINERALS CH
1.0000 | ORAL_TABLET | Freq: Every day | ORAL | Status: DC
  Administered 2015-03-20: 1 via ORAL
  Filled 2015-03-19 (×2): qty 1

## 2015-03-19 MED ORDER — MONTELUKAST SODIUM 10 MG PO TABS
10.0000 mg | ORAL_TABLET | Freq: Every day | ORAL | Status: DC
  Administered 2015-03-19 – 2015-03-20 (×2): 10 mg via ORAL
  Filled 2015-03-19 (×2): qty 1

## 2015-03-19 MED ORDER — ALPRAZOLAM 0.5 MG PO TABS
0.5000 mg | ORAL_TABLET | Freq: Two times a day (BID) | ORAL | Status: DC | PRN
  Administered 2015-03-19: 0.5 mg via ORAL
  Filled 2015-03-19: qty 1

## 2015-03-19 MED ORDER — PANTOPRAZOLE SODIUM 40 MG PO TBEC
40.0000 mg | DELAYED_RELEASE_TABLET | Freq: Every day | ORAL | Status: DC
  Administered 2015-03-19 – 2015-03-20 (×2): 40 mg via ORAL
  Filled 2015-03-19 (×2): qty 1

## 2015-03-19 MED ORDER — HYDROCHLOROTHIAZIDE 12.5 MG PO CAPS
12.5000 mg | ORAL_CAPSULE | Freq: Every day | ORAL | Status: DC
  Administered 2015-03-20: 12.5 mg via ORAL
  Filled 2015-03-19 (×2): qty 1

## 2015-03-19 MED ORDER — HYPROMELLOSE (GONIOSCOPIC) 2.5 % OP SOLN
1.0000 [drp] | OPHTHALMIC | Status: DC | PRN
  Filled 2015-03-19: qty 15

## 2015-03-19 MED ORDER — HEPARIN SODIUM (PORCINE) 5000 UNIT/ML IJ SOLN
5000.0000 [IU] | Freq: Three times a day (TID) | INTRAMUSCULAR | Status: DC
Start: 2015-03-19 — End: 2015-03-20
  Administered 2015-03-19 – 2015-03-20 (×3): 5000 [IU] via SUBCUTANEOUS
  Filled 2015-03-19 (×2): qty 1

## 2015-03-19 MED ORDER — ONDANSETRON HCL 4 MG/2ML IJ SOLN
4.0000 mg | Freq: Once | INTRAMUSCULAR | Status: DC
  Filled 2015-03-19: qty 2

## 2015-03-19 MED ORDER — AZELASTINE HCL 0.1 % NA SOLN
NASAL | Status: AC
  Filled 2015-03-19: qty 30

## 2015-03-19 MED ORDER — ONDANSETRON HCL 4 MG/2ML IJ SOLN
4.0000 mg | Freq: Once | INTRAMUSCULAR | Status: AC
  Administered 2015-03-19: 4 mg via INTRAVENOUS

## 2015-03-19 MED ORDER — LOSARTAN POTASSIUM-HCTZ 50-12.5 MG PO TABS: 1.0000 | ORAL_TABLET | Freq: Every day | ORAL | Status: DC

## 2015-03-19 MED ORDER — LOSARTAN POTASSIUM 50 MG PO TABS
50.0000 mg | ORAL_TABLET | Freq: Every day | ORAL | Status: DC
  Administered 2015-03-20: 50 mg via ORAL
  Filled 2015-03-19 (×2): qty 1

## 2015-03-19 MED ORDER — MOMETASONE FURO-FORMOTEROL FUM 100-5 MCG/ACT IN AERO
INHALATION_SPRAY | RESPIRATORY_TRACT | Status: AC
  Filled 2015-03-19: qty 8.8

## 2015-03-19 MED ORDER — ONDANSETRON HCL 4 MG/2ML IJ SOLN: 4.0000 mg | Freq: Four times a day (QID) | INTRAMUSCULAR | Status: DC | PRN

## 2015-03-19 NOTE — ED Notes (Signed)
Pt had episode of dizziness that lasted approx 10 min yesterday morning. States woke up with the dizziness this am that lasted approx 6 hrs. States is better now than earlier. States felt himself falling so sat on the floor earlier this am. Nausea. No vomiting. Has never had this happen before. SS in triage negative.

## 2015-03-19 NOTE — ED Provider Notes (Signed)
CSN: [id number]     Arrival date & time 03/19/15  1547 History   First MD Initiated Contact with Patient 03/19/15 1619     Chief Complaint  Patient presents with  . Dizziness     (Consider location/radiation/quality/duration/timing/severity/associated sxs/prior Treatment) HPI 69 year old man who comes in today complaining of episodic dizziness. The first episode was present yesterday morning when he awoke and rolled over. It lasted for 10-15 minutes and resolved prior to getting out of bed. He was well during the day than yesterday. Today he awoke with dizziness. It worsened with any head movement as has continued to be present throughout the day. He was able to ambulate but had to hold onto objects to maintain his balance. He denies any changes in vision, word finding, or lateralized weakness. He is not having any pain associated with this. He has not any similar episodes prior to yesterday. Today the symptoms have continued throughout the day but are improved from earlier today. He did not have any vomiting associated with this. He came in with his wife via private vehicle. Past Medical History  Diagnosis Date  . Hypertension   . Asthma   . Prostate ca    Past Surgical History  Procedure Laterality Date  . Prostate surgery    . Nasal sinus surgery     History reviewed. No pertinent family history. History  Substance Use Topics  . Smoking status: Former Research scientist (life sciences)  . Smokeless tobacco: Not on file  . Alcohol Use: Yes     Comment: occ    Review of Systems  All other systems reviewed and are negative.     Allergies  Shellfish allergy and Penicillins  Home Medications   Prior to Admission medications   Medication Sig Start Date End Date Taking? Authorizing Provider  ALPRAZolam Duanne Moron) 0.5 MG tablet Take 0.5 mg by mouth 2 (two) times daily as needed. Nerves/sleep    Historical Provider, MD  azelastine (ASTELIN) 137 MCG/SPRAY nasal spray Place 1 spray into the nose 2 (two) times  daily. Use in each nostril as directed    Historical Provider, MD  benazepril (LOTENSIN) 20 MG tablet Take 20 mg by mouth daily.    Historical Provider, MD  colesevelam (WELCHOL) 625 MG tablet Take 1,875 mg by mouth 2 (two) times daily.    Historical Provider, MD  Fluticasone-Salmeterol (ADVAIR) 250-50 MCG/DOSE AEPB Inhale 1 puff into the lungs every 12 (twelve) hours.    Historical Provider, MD  HYDROcodone-acetaminophen (VICODIN) 5-500 MG per tablet Take 1 tablet by mouth every 8 (eight) hours as needed. Pain patient taking for 10 days    Historical Provider, MD  meloxicam (MOBIC) 15 MG tablet Take 15 mg by mouth daily.    Historical Provider, MD   BP 152/70 mmHg  Pulse 80  Temp(Src) 97.9 F (36.6 C) (Oral)  Resp 15  Ht 5\' 11"  (1.803 m)  Wt 215 lb (97.523 kg)  BMI 30.00 kg/m2  SpO2 97% Physical Exam  Constitutional: He is oriented to person, place, and time. He appears well-developed and well-nourished.  HENT:  Head: Normocephalic and atraumatic.  Right Ear: Tympanic membrane and external ear normal.  Left Ear: Tympanic membrane and external ear normal.  Nose: Nose normal. Right sinus exhibits no maxillary sinus tenderness and no frontal sinus tenderness. Left sinus exhibits no maxillary sinus tenderness and no frontal sinus tenderness.  Eyes: Conjunctivae and EOM are normal. Pupils are equal, round, and reactive to light. Right eye exhibits no nystagmus. Left eye  exhibits no nystagmus.  Neck: Normal range of motion. Neck supple.  Cardiovascular: Normal rate, regular rhythm, normal heart sounds and intact distal pulses.   Pulmonary/Chest: Effort normal and breath sounds normal. No respiratory distress. He exhibits no tenderness.  Abdominal: Soft. Bowel sounds are normal. He exhibits no distension and no mass. There is no tenderness.  Musculoskeletal: Normal range of motion. He exhibits no edema or tenderness.  Neurological: He is alert and oriented to person, place, and time. He has  normal strength and normal reflexes. No sensory deficit. He displays a negative Romberg sign. GCS eye subscore is 4. GCS verbal subscore is 5. GCS motor subscore is 6.  Reflex Scores:      Tricep reflexes are 2+ on the right side and 2+ on the left side.      Bicep reflexes are 2+ on the right side and 2+ on the left side.      Brachioradialis reflexes are 2+ on the right side and 2+ on the left side.      Patellar reflexes are 2+ on the right side and 2+ on the left side.      Achilles reflexes are 2+ on the right side and 2+ on the left side. Patient was not ambulated. He stood and felt somewhat off balance and was seated again. Speech is normal without dysarthria, dysphasia, or aphasia. Muscle strength is 5/5 in bilateral shoulders, elbow flexor and extensors, wrist flexor and extensors, and intrinsic hand muscles. 5/5 bilateral lower extremity hip flexors, extensors, knee flexors and extensors, and ankle dorsi and plantar flexors.  Finger nose equal bilaterally. Heel shin equal bilaterally.  Skin: Skin is warm and dry. No rash noted.  Psychiatric: He has a normal mood and affect. His behavior is normal. Judgment and thought content normal.  Nursing note and vitals reviewed.   ED Course  Procedures (including critical care time) Labs Review Labs Reviewed  CBC - Abnormal; Notable for the following:    WBC 11.2 (*)    All other components within normal limits  DIFFERENTIAL - Abnormal; Notable for the following:    Neutro Abs 8.6 (*)    All other components within normal limits  COMPREHENSIVE METABOLIC PANEL - Abnormal; Notable for the following:    Glucose, Bld 109 (*)    GFR calc non Af Amer 88 (*)    All other components within normal limits  ETHANOL  PROTIME-INR  APTT  URINE RAPID DRUG SCREEN (HOSP PERFORMED)  URINALYSIS, ROUTINE W REFLEX MICROSCOPIC  I-STAT TROPOININ, ED    Imaging Review Ct Head Wo Contrast  03/19/2015   CLINICAL DATA:  Dizziness.  Syncope.  Near syncope.   EXAM: CT HEAD WITHOUT CONTRAST  TECHNIQUE: Contiguous axial images were obtained from the base of the skull through the vertex without intravenous contrast.  COMPARISON:  None.  FINDINGS: No mass lesion, mass effect, midline shift, hydrocephalus, hemorrhage. No territorial ischemia or acute infarction. Tiny LEFT caudate head lacunar infarct. 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  IMPRESSION: No acute intracranial abnormality. 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   Electronically Signed   By: Dereck Ligas M.D.   On: 03/19/2015 17:54     EKG Interpretation   Date/Time:  Sunday March 19 2015 16:38:57 EDT Ventricular Rate:  77 PR Interval:  158 QRS Duration: 136 QT Interval:  419 QTC Calculation: 474 R Axis:   -76 Text Interpretation:  Sinus rhythm Right bundle branch block Inferior  infarct, old Confirmed by Mordecai Tindol MD, Ozell Juhasz [phone]) on  03/19/2015 5:12:37 PM      MDM   Final diagnoses:  Vertigo    69 year old male with vertigo symptoms that began with last known normal going to bed last night. Patient has no other neuro deficit noted. Does continue to be vertiginous with any head movement. Head CT reveals an old caudate head lacunar infarct. Plan observation for further stroke evaluation and MRI tomorrow.    Pattricia Boss, MD 03/19/15 [phone]

## 2015-03-19 NOTE — H&P (Signed)
[facility] History and Physical  KEEGEN HEFFERN XBD:[id number] DOB: 06/02/1946 DOA: 03/19/2015  Referring physician: ER PCP: Deloria Lair, MD   Chief Complaint: Dizziness  HPI: Roberto Sanchez is a 69 y.o. male  This is a 69 year old man, hypertensive, who presents with dizziness which by his description is true vertigo. He has had this since approximately 8 AM this morning intermittently. His initial episode lasted almost 2-1/2 hours where he had to lie down. He did have an episode of vertigo lasting only 5 minutes yesterday. He denied any diplopia or alteration in speech with the vertigo. There is no headache, nausea, vomiting, limb weakness or visual field defects. There is no word finding difficulties. Evaluation in the emergency room with a CT brain scan showed an old caudate head lacunar infarct and there is concern whether these symptoms represent a new acute CVA. He is now being admitted for further investigation.   Review of Systems:  Apart from symptoms above, all systems negative.  Past Medical History  Diagnosis Date  . Hypertension   . Asthma   . Prostate ca    Past Surgical History  Procedure Laterality Date  . Prostate surgery    . Nasal sinus surgery     Social History:  reports that he has quit smoking. He does not have any smokeless tobacco history on file. He reports that he drinks alcohol. He reports that he does not use illicit drugs.  Allergies  Allergen Reactions  . Shellfish Allergy Swelling    Facial Swelling   . Penicillins Rash     Family history: His father had colon cancer. His parents both had hypertension.   Prior to Admission medications   Medication Sig Start Date End Date Taking? Authorizing Provider  ALPRAZolam Duanne Moron) 0.5 MG tablet Take 0.5 mg by mouth 2 (two) times daily as needed. Nerves/sleep   Yes Historical Provider, MD  azelastine (ASTELIN) 137 MCG/SPRAY nasal spray Place 1 spray into the nose 2 (two) times daily. Use in  each nostril as directed   Yes Historical Provider, MD  benazepril (LOTENSIN) 20 MG tablet Take 20 mg by mouth daily.   Yes Historical Provider, MD  Fluticasone-Salmeterol (ADVAIR) 250-50 MCG/DOSE AEPB Inhale 1 puff into the lungs every 12 (twelve) hours.   Yes Historical Provider, MD  hydroxypropyl methylcellulose / hypromellose (ISOPTO TEARS / GONIOVISC) 2.5 % ophthalmic solution Place 1 drop into both eyes as needed for dry eyes.   Yes Historical Provider, MD  ibuprofen (ADVIL,MOTRIN) 200 MG tablet Take 400 mg by mouth every 6 (six) hours as needed for mild pain.   Yes Historical Provider, MD  losartan-hydrochlorothiazide (HYZAAR) 50-12.5 MG per tablet Take 1 tablet by mouth daily. 03/09/15  Yes Historical Provider, MD  montelukast (SINGULAIR) 10 MG tablet Take 10 mg by mouth daily. 03/18/15  Yes Historical Provider, MD  Multiple Vitamin (MULTIVITAMIN WITH MINERALS) TABS tablet Take 1 tablet by mouth daily.   Yes Historical Provider, MD  naproxen sodium (ANAPROX) 220 MG tablet Take 440 mg by mouth 2 (two) times daily as needed (Pain).   Yes Historical Provider, MD  omeprazole (PRILOSEC) 20 MG capsule Take 20 mg by mouth daily. 01/27/15  Yes Historical Provider, MD   Physical Exam: Filed Vitals:   03/19/15 1800 03/19/15 1831 03/19/15 1917 03/19/15 1930  BP: 152/73 149/95 146/96 161/87  Pulse: 63 64 73 65  Temp:  98.1 F (36.7 C) 98.1 F (36.7 C)   TempSrc:  Oral Oral   Resp: 12  18 14 19   Height:    5\' 11"  (1.803 m)  Weight:    97.523 kg (215 lb)  SpO2: 98% 100% 97% 99%    Wt Readings from Last 3 Encounters:  03/19/15 97.523 kg (215 lb)  02/16/12 95.255 kg (210 lb)  01/27/12 95.255 kg (210 lb)    General:  Appears calm and comfortable. He is alert and oriented. Eyes: PERRL, normal lids, irises & conjunctiva ENT: grossly normal hearing, lips & tongue Neck: no LAD, masses or thyromegaly Cardiovascular: RRR, no m/r/g. No LE edema. Telemetry: SR, no arrhythmias  Respiratory: CTA  bilaterally, no w/r/r. Normal respiratory effort. Abdomen: soft, ntnd Skin: no rash or induration seen on limited exam Musculoskeletal: grossly normal tone BUE/BLE Psychiatric: grossly normal mood and affect, speech fluent and appropriate Neurologic: grossly non-focal. He does not, in particular, have cerebellar signs. There is no nystagmus.           Labs on Admission:  Basic Metabolic Panel:  Recent Labs Lab 03/19/15 1630  NA 139  K 4.1  CL 104  CO2 29  GLUCOSE 109*  BUN 18  CREATININE 0.83  CALCIUM 9.0   Liver Function Tests:  Recent Labs Lab 03/19/15 1630  AST 24  ALT 29  ALKPHOS 64  BILITOT 0.7  PROT 7.0  ALBUMIN 4.1   No results for input(s): LIPASE, AMYLASE in the last 168 hours. No results for input(s): AMMONIA in the last 168 hours. CBC:  Recent Labs Lab 03/19/15 1630  WBC 11.2*  NEUTROABS 8.6*  HGB 15.7  HCT 45.6  MCV 95.6  PLT 332   Cardiac Enzymes: No results for input(s): CKTOTAL, CKMB, CKMBINDEX, TROPONINI in the last 168 hours.  BNP (last 3 results) No results for input(s): BNP in the last 8760 hours.  ProBNP (last 3 results) No results for input(s): PROBNP in the last 8760 hours.  CBG: No results for input(s): GLUCAP in the last 168 hours.  Radiological Exams on Admission: Ct Head Wo Contrast  03/19/2015   CLINICAL DATA:  Dizziness.  Syncope.  Near syncope.  EXAM: CT HEAD WITHOUT CONTRAST  TECHNIQUE: Contiguous axial images were obtained from the base of the skull through the vertex without intravenous contrast.  COMPARISON:  None.  FINDINGS: No mass lesion, mass effect, midline shift, hydrocephalus, hemorrhage. No territorial ischemia or acute infarction. Tiny LEFT caudate head lacunar infarct. 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  IMPRESSION: No acute intracranial abnormality. 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   Electronically Signed   By: Dereck Ligas M.D.   On: 03/19/2015 17:54      Assessment/Plan   1. Vertigo. This is  probably peripheral in origin. However with CT brain scan finding, there is some concern regarding a new CVA. We will obtain MRI brain scan and ask neurology to see the patient in consultation. 2. Hypertension. Stable.  Further recommendations will depend on patient's hospital progress.   Code Status: Full code.   DVT Prophylaxis: Heparin.  Family Communication: I discussed the plan with the patient at the bedside  Disposition Plan: Home when medically stable.   Time spent: 45 minutes.  Doree Albee [facility] Pager [phone].

## 2015-03-20 ENCOUNTER — Encounter (HOSPITAL_COMMUNITY): Payer: Self-pay | Admitting: Internal Medicine

## 2015-03-20 ENCOUNTER — Inpatient Hospital Stay (HOSPITAL_COMMUNITY): Payer: Medicare Other

## 2015-03-20 DIAGNOSIS — F419 Anxiety disorder, unspecified: Secondary | ICD-10-CM | POA: Diagnosis present

## 2015-03-20 DIAGNOSIS — J45909 Unspecified asthma, uncomplicated: Secondary | ICD-10-CM

## 2015-03-20 LAB — URINALYSIS, ROUTINE W REFLEX MICROSCOPIC
Bilirubin Urine: NEGATIVE
Glucose, UA: NEGATIVE mg/dL
Hgb urine dipstick: NEGATIVE
Ketones, ur: NEGATIVE mg/dL
LEUKOCYTES UA: NEGATIVE
Nitrite: NEGATIVE
Protein, ur: NEGATIVE mg/dL
Specific Gravity, Urine: 1.02 (ref 1.005–1.030)
Urobilinogen, UA: 0.2 mg/dL (ref 0.0–1.0)
pH: 6 (ref 5.0–8.0)

## 2015-03-20 LAB — TSH: TSH: 1.088 u[IU]/mL (ref 0.350–4.500)

## 2015-03-20 LAB — RAPID URINE DRUG SCREEN, HOSP PERFORMED
AMPHETAMINES: NOT DETECTED
BENZODIAZEPINES: POSITIVE — AB
Barbiturates: NOT DETECTED
Cocaine: NOT DETECTED
OPIATES: NOT DETECTED
Tetrahydrocannabinol: NOT DETECTED

## 2015-03-20 LAB — VITAMIN B12: Vitamin B-12: 783 pg/mL (ref 211–911)

## 2015-03-20 MED ORDER — MECLIZINE HCL 12.5 MG PO TABS: 12.5000 mg | ORAL_TABLET | Freq: Two times a day (BID) | ORAL | Status: DC | PRN

## 2015-03-20 NOTE — Consult Note (Addendum)
Roberto A. Merlene Laughter, MD     [email or web address]          Roberto Sanchez is an 69 y.o. male.   ASSESSMENT/PLAN: 1. 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 Differential diagnosis includes a TIA but given the lack of associated symptoms such as diplopia, dysarthria, dysphagia, numbness or tingling, the most likely etiology is from a vestibular/peripheral cause. 2. Remote left caudate nuclei infarct. 3. Hypertension.  RECOMMENDATION: Agree with vestibular exercises and therapy. Continue antiplatelet agent aspirin. Blood pressure controlled.   The patient is 69 year old man who woke up about 2 days ago and felt a little dizzy but this resolved. He woke up the following day on Sunday and felt the same sensation but this was quite dramatically worse with severe spinning sensation to the point that the patient had to lay down. Severe spell lasted for about a minute and resolved but he still had some gait instability. The patient denies focal numbness, weakness, dysarthria, dysphagia or diplopia. The patient reports that he is back at baseline is feeling well. He has ambulated in the hall stay without any problems. She denies other symptoms such as chest pain, headaches, shortness of breath, GI GU symptoms. He reports being compliant with his medications. He has been tried on statins but this caused significant muscle problems. He indicates that he has been given Welchol but costs has missed severe limiting factor. He apparently is taking omega-3 fatty acids which has been effective for his dyslipidemia.  GENERAL: Pleasant gentleman in no acute distress.  HEENT: This is normal.  ABDOMEN: soft  EXTREMITIES: No edema   BACK:This is normal.  SKIN: Normal by inspection.    MENTAL STATUS: Alert and oriented. Speech, language and cognition are generally intact. Judgment and insight normal.   CRANIAL NERVES: Pupils are equal, round and  reactive to light and accomodation; extra ocular movements are full, there is no significant nystagmus; visual fields are full; upper and lower facial muscles are normal in strength and symmetric, there is no flattening of the nasolabial folds; tongue is midline; uvula is midline; shoulder elevation is normal.  MOTOR: Normal tone, bulk and strength; no pronator drift.  COORDINATION: Left finger to nose is normal, right finger to nose is normal, No rest tremor; no intention tremor; no postural tremor; no bradykinesia.  REFLEXES: Deep tendon reflexes are symmetrical and normal. Babinski reflexes are flexor bilaterally.   SENSATION: Normal to light touch, temperature, and pinprick.  GAIT: Normal.    The brain MRI is reviewed in person. There is nothing acute seen on diffusion imaging. There is mild to moderate global atrophy. No significant ischemic white matter changes are seen. There is a small encephalomalacia involving the left caudate new quite consistent with lacunar infarct.  Blood pressure 111/69, pulse 77, temperature 98.4 F (36.9 C), temperature source Oral, resp. rate 20, height $RemoveBe'5\' 11"'zjRoyJyxo$  (1.803 m), weight 91.763 kg (202 lb 4.8 oz), SpO2 99 %.  Past Medical History  Diagnosis Date  . Hypertension   . Asthma   . Prostate ca   . Anxiety     Past Surgical History  Procedure Laterality Date  . Prostate surgery    . Nasal sinus surgery      History reviewed. No pertinent family history.  Social History:  reports that he has quit smoking. He does not have any smokeless tobacco history on file. He reports that he drinks alcohol. He reports that he does not use illicit drugs.  Allergies:  Allergies  Allergen Reactions  . Shellfish Allergy Swelling    Facial Swelling   . Penicillins Rash    Medications: Prior to Admission medications   Medication Sig Start Date End Date Taking? Authorizing Provider  ALPRAZolam Duanne Moron) 0.5 MG tablet Take 0.5 mg by mouth 2 (two) times daily  as needed. Nerves/sleep   Yes Historical Provider, MD  azelastine (ASTELIN) 137 MCG/SPRAY nasal spray Place 1 spray into the nose 2 (two) times daily. Use in each nostril as directed   Yes Historical Provider, MD  benazepril (LOTENSIN) 20 MG tablet Take 20 mg by mouth daily.   Yes Historical Provider, MD  Fluticasone-Salmeterol (ADVAIR) 250-50 MCG/DOSE AEPB Inhale 1 puff into the lungs every 12 (twelve) hours.   Yes Historical Provider, MD  hydroxypropyl methylcellulose / hypromellose (ISOPTO TEARS / GONIOVISC) 2.5 % ophthalmic solution Place 1 drop into both eyes as needed for dry eyes.   Yes Historical Provider, MD  ibuprofen (ADVIL,MOTRIN) 200 MG tablet Take 400 mg by mouth every 6 (six) hours as needed for mild pain.   Yes Historical Provider, MD  losartan-hydrochlorothiazide (HYZAAR) 50-12.5 MG per tablet Take 1 tablet by mouth daily. 03/09/15  Yes Historical Provider, MD  montelukast (SINGULAIR) 10 MG tablet Take 10 mg by mouth daily. 03/18/15  Yes Historical Provider, MD  Multiple Vitamin (MULTIVITAMIN WITH MINERALS) TABS tablet Take 1 tablet by mouth daily.   Yes Historical Provider, MD  naproxen sodium (ANAPROX) 220 MG tablet Take 440 mg by mouth 2 (two) times daily as needed (Pain).   Yes Historical Provider, MD  omeprazole (PRILOSEC) 20 MG capsule Take 20 mg by mouth daily. 01/27/15  Yes Historical Provider, MD  meclizine (ANTIVERT) 12.5 MG tablet Take 1 tablet (12.5 mg total) by mouth 2 (two) times daily as needed for dizziness. 03/20/15   Radene Gunning, NP    Scheduled Meds: . azelastine  1 spray Each Nare BID  . benazepril  20 mg Oral Daily  . heparin  5,000 Units Subcutaneous 3 times per day  . losartan  50 mg Oral Daily   And  . hydrochlorothiazide  12.5 mg Oral Daily  . mometasone-formoterol  2 puff Inhalation BID  . montelukast  10 mg Oral Daily  . multivitamin with minerals  1 tablet Oral Daily  . pantoprazole  40 mg Oral Daily   Continuous Infusions:  PRN Meds:.ALPRAZolam,  hydroxypropyl methylcellulose / hypromellose, meclizine, ondansetron **OR** ondansetron (ZOFRAN) IV     Results for orders placed or performed during the hospital encounter of 03/19/15 (from the past 48 hour(s))  Ethanol     Status: None   Collection Time: 03/19/15  4:30 PM  Result Value Ref Range   Alcohol, Ethyl (B) <5 0 - 9 mg/dL    Comment:        LOWEST DETECTABLE LIMIT FOR SERUM ALCOHOL IS 11 mg/dL FOR MEDICAL PURPOSES ONLY   Protime-INR     Status: None   Collection Time: 03/19/15  4:30 PM  Result Value Ref Range   Prothrombin Time 11.9 11.6 - 15.2 seconds   INR 0.87 0.00 - 1.49  APTT     Status: None   Collection Time: 03/19/15  4:30 PM  Result Value Ref Range   aPTT 27 24 - 37 seconds  CBC     Status: Abnormal   Collection Time: 03/19/15  4:30 PM  Result Value Ref Range   WBC 11.2 (H) 4.0 - 10.5 K/uL   RBC 4.77 4.22 -  5.81 MIL/uL   Hemoglobin 15.7 13.0 - 17.0 g/dL   HCT 45.6 39.0 - 52.0 %   MCV 95.6 78.0 - 100.0 fL   MCH 32.9 26.0 - 34.0 pg   MCHC 34.4 30.0 - 36.0 g/dL   RDW 13.1 11.5 - 15.5 %   Platelets 332 150 - 400 K/uL  Differential     Status: Abnormal   Collection Time: 03/19/15  4:30 PM  Result Value Ref Range   Neutrophils Relative % 76 43 - 77 %   Neutro Abs 8.6 (H) 1.7 - 7.7 K/uL   Lymphocytes Relative 18 12 - 46 %   Lymphs Abs 2.0 0.7 - 4.0 K/uL   Monocytes Relative 5 3 - 12 %   Monocytes Absolute 0.5 0.1 - 1.0 K/uL   Eosinophils Relative 1 0 - 5 %   Eosinophils Absolute 0.1 0.0 - 0.7 K/uL   Basophils Relative 0 0 - 1 %   Basophils Absolute 0.0 0.0 - 0.1 K/uL  Comprehensive metabolic panel     Status: Abnormal   Collection Time: 03/19/15  4:30 PM  Result Value Ref Range   Sodium 139 135 - 145 mmol/L   Potassium 4.1 3.5 - 5.1 mmol/L   Chloride 104 96 - 112 mmol/L   CO2 29 19 - 32 mmol/L   Glucose, Bld 109 (H) 70 - 99 mg/dL   BUN 18 6 - 23 mg/dL   Creatinine, Ser 0.83 0.50 - 1.35 mg/dL   Calcium 9.0 8.4 - 10.5 mg/dL   Total Protein 7.0  6.0 - 8.3 g/dL   Albumin 4.1 3.5 - 5.2 g/dL   AST 24 0 - 37 U/L   ALT 29 0 - 53 U/L   Alkaline Phosphatase 64 39 - 117 U/L   Total Bilirubin 0.7 0.3 - 1.2 mg/dL   GFR calc non Af Amer 88 (L) >90 mL/min   GFR calc Af Amer >90 >90 mL/min    Comment: (NOTE) The eGFR has been calculated using the CKD EPI equation. This calculation has not been validated in all clinical situations. eGFR's persistently <90 mL/min signify possible Chronic Kidney Disease.    Anion gap 6 5 - 15  I-Stat Troponin, ED (not at [facility])     Status: None   Collection Time: 03/19/15  5:03 PM  Result Value Ref Range   Troponin i, poc 0.00 0.00 - 0.08 ng/mL   Comment 3            Comment: Due to the release kinetics of cTnI, a negative result within the first hours of the onset of symptoms does not rule out myocardial infarction with certainty. If myocardial infarction is still suspected, repeat the test at appropriate intervals.   Urine Drug Screen     Status: Abnormal   Collection Time: 03/20/15  7:30 AM  Result Value Ref Range   Opiates NONE DETECTED NONE DETECTED   Cocaine NONE DETECTED NONE DETECTED   Benzodiazepines POSITIVE (A) NONE DETECTED   Amphetamines NONE DETECTED NONE DETECTED   Tetrahydrocannabinol NONE DETECTED NONE DETECTED   Barbiturates NONE DETECTED NONE DETECTED    Comment:        DRUG SCREEN FOR MEDICAL PURPOSES ONLY.  IF CONFIRMATION IS NEEDED FOR ANY PURPOSE, NOTIFY LAB WITHIN 5 DAYS.        LOWEST DETECTABLE LIMITS FOR URINE DRUG SCREEN Drug Class       Cutoff (ng/mL) Amphetamine      1000 Barbiturate  200 Benzodiazepine   087 Tricyclics       199 Opiates          300 Cocaine          300 THC              50   Urinalysis, Routine w reflex microscopic     Status: None   Collection Time: 03/20/15  7:30 AM  Result Value Ref Range   Color, Urine YELLOW YELLOW   APPearance CLEAR CLEAR   Specific Gravity, Urine 1.020 1.005 - 1.030   pH 6.0 5.0 - 8.0   Glucose, UA NEGATIVE  NEGATIVE mg/dL   Hgb urine dipstick NEGATIVE NEGATIVE   Bilirubin Urine NEGATIVE NEGATIVE   Ketones, ur NEGATIVE NEGATIVE mg/dL   Protein, ur NEGATIVE NEGATIVE mg/dL   Urobilinogen, UA 0.2 0.0 - 1.0 mg/dL   Nitrite NEGATIVE NEGATIVE   Leukocytes, UA NEGATIVE NEGATIVE    Comment: MICROSCOPIC NOT DONE ON URINES WITH NEGATIVE PROTEIN, BLOOD, LEUKOCYTES, NITRITE, OR GLUCOSE <1000 mg/dL.  TSH     Status: None   Collection Time: 03/20/15  7:41 AM  Result Value Ref Range   TSH 1.088 0.350 - 4.500 uIU/mL  Vitamin B12     Status: None   Collection Time: 03/20/15  7:41 AM  Result Value Ref Range   Vitamin B-12 783 211 - 911 pg/mL    Comment: Performed at [facility]    Studies/Results: BRAIN MRI: 1. No acute intracranial abnormality.  2. 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     Kamarah Bilotta A. Merlene Sanchez, M.D.  Diplomate, Tax adviser of Psychiatry and Neurology ( Neurology). 03/20/2015, 5:19 PM

## 2015-03-20 NOTE — Progress Notes (Signed)
Discharge instruction reviewed with patient. Prescription given to patient. No distress noted. 

## 2015-03-20 NOTE — Care Management Utilization Note (Signed)
UR completed 

## 2015-03-20 NOTE — Care Management Note (Signed)
    Page 1 of 1   03/20/2015     2:29:51 PM CARE MANAGEMENT NOTE 03/20/2015  Patient:  Roberto Sanchez, Roberto Sanchez   Account Number:  [id number]  Date Initiated:  03/20/2015  Documentation initiated by:  Jolene Provost  Subjective/Objective Assessment:   Pt from home, independent at  baseline Pt plans to discharge home with self care, no CM needs.     Action/Plan:   Anticipated DC Date:  03/20/2015   Anticipated DC Plan:  [place]  CM consult      Choice offered to / List presented to:             Status of service:  Completed, signed off Medicare Important Message given?   (If response is "NO", the following Medicare IM given date fields will be blank) Date Medicare IM given:   Medicare IM given by:   Date Additional Medicare IM given:   Additional Medicare IM given by:    Discharge Disposition:  HOME/SELF CARE  Per UR Regulation:    If discussed at Long Length of Stay Meetings, dates discussed:    Comments:  03/20/2015 [place], RN, MSN, CM

## 2015-03-20 NOTE — Progress Notes (Signed)
[facility] PROGRESS NOTE  NAVARRO NINE EPP:[id number] DOB: 11/01/1946 DOA: 03/19/2015 PCP: Deloria Lair, MD  Assessment/Plan: 1. Vertigo. likely peripheral in origin. Much better this am. Received meclizine in ED. Not orthostatic. 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 MRI with no acute abnormalities. Evaluated by physical therapy who recommends no PT follow up. Educated to Epley maneuver as he reported episodes occurred when he was lying in bed on right side. Await neurology input.  2. Hypertension. Fair control. Home meds include benazepril, losartan-HCTZ. 3. Asthma: stable at baseline. Continue home medications 4. Anxiety: home medications include xanax TID prn. Urine +benzo. Appears stable at baseline. Continue xanax BID prn.  Code Status: full amily Communication: none present Disposition Plan: home likely tomorrow   Consultants:  neurology  Procedures:  none  Antibiotics:  none  HPI/Subjective: Sitting in chair reports feeling much better  Objective: Filed Vitals:   03/20/15 1315  BP: 111/69  Pulse: 77  Temp: 98.4 F (36.9 C)  Resp: 20    Intake/Output Summary (Last 24 hours) at 03/20/15 1620 Last data filed at 03/20/15 0843  Gross per 24 hour  Intake    240 ml  Output      0 ml  Net    240 ml   Filed Weights   03/19/15 1553 03/19/15 1930 03/20/15 0544  Weight: 97.523 kg (215 lb) 97.523 kg (215 lb) 91.763 kg (202 lb 4.8 oz)    Exam:   General:  Well nourished appears well  Cardiovascular: RRR no MGR No LE edema  Respiratory: normal effort BS clear bilaterally   Abdomen: soft +BS non-tender  Musculoskeletal: no clubbing or cyanosis  Neuro: alert oriented, cranial nerve II-XII intact  Data Reviewed: Basic Metabolic Panel:  Recent Labs Lab 03/19/15 1630  NA 139  K 4.1  CL 104  CO2 29  GLUCOSE 109*  BUN 18  CREATININE 0.83  CALCIUM 9.0   Liver Function Tests:  Recent Labs Lab 03/19/15 1630  AST 24  ALT 29   ALKPHOS 64  BILITOT 0.7  PROT 7.0  ALBUMIN 4.1   No results for input(s): LIPASE, AMYLASE in the last 168 hours. No results for input(s): AMMONIA in the last 168 hours. CBC:  Recent Labs Lab 03/19/15 1630  WBC 11.2*  NEUTROABS 8.6*  HGB 15.7  HCT 45.6  MCV 95.6  PLT 332   Cardiac Enzymes: No results for input(s): CKTOTAL, CKMB, CKMBINDEX, TROPONINI in the last 168 hours. BNP (last 3 results) No results for input(s): BNP in the last 8760 hours.  ProBNP (last 3 results) No results for input(s): PROBNP in the last 8760 hours.  CBG: No results for input(s): GLUCAP in the last 168 hours.  No results found for this or any previous visit (from the past 240 hour(s)).   Studies: Ct Head Wo Contrast  03/19/2015   CLINICAL DATA:  Dizziness.  Syncope.  Near syncope.  EXAM: CT HEAD WITHOUT CONTRAST  TECHNIQUE: Contiguous axial images were obtained from the base of the skull through the vertex without intravenous contrast.  COMPARISON:  None.  FINDINGS: No mass lesion, mass effect, midline shift, hydrocephalus, hemorrhage. No territorial ischemia or acute infarction. Tiny LEFT caudate head lacunar infarct. 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  IMPRESSION: No acute intracranial abnormality. 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   Electronically Signed   By: Dereck Ligas M.D.   On: 03/19/2015 17:54   Mr Brain Wo Contrast  03/20/2015   CLINICAL DATA:  69 year old male with syncope  and vertigo. Initial encounter.  EXAM: MRI HEAD WITHOUT CONTRAST  TECHNIQUE: Multiplanar, multiecho pulse sequences of the brain and surrounding structures were obtained without intravenous contrast.  COMPARISON:  Head CT without contrast 03/19/2015.  FINDINGS: Cerebral volume is within normal limits for age. No restricted diffusion to suggest acute infarction. No midline shift, mass effect, evidence of mass lesion, ventriculomegaly, extra-axial collection or acute intracranial hemorrhage. Cervicomedullary junction  and pituitary are within normal limits. Major intracranial vascular flow voids are preserved, with dominant appearing distal right vertebral artery.  Chronic caudate lacunar infarct as seen on the recent head CT. Associated hemosiderin. Elsewhere normal for age gray and white matter signal.  Visible internal auditory structures appear normal. Mastoids are clear.  Trace paranasal sinus mucosal thickening. Visualized orbit soft tissues are within normal limits. Visualized scalp soft tissues are within normal limits. Normal bone marrow signal.  IMPRESSION: 1.  No acute intracranial abnormality. 2. 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   Electronically Signed   By: Genevie Ann M.D.   On: 03/20/2015 09:01    Scheduled Meds: . azelastine  1 spray Each Nare BID  . benazepril  20 mg Oral Daily  . heparin  5,000 Units Subcutaneous 3 times per day  . losartan  50 mg Oral Daily   And  . hydrochlorothiazide  12.5 mg Oral Daily  . mometasone-formoterol  2 puff Inhalation BID  . montelukast  10 mg Oral Daily  . multivitamin with minerals  1 tablet Oral Daily  . pantoprazole  40 mg Oral Daily   Continuous Infusions:   Principal Problem:   Vertigo Active Problems:   Essential hypertension   Asthma, chronic   Anxiety    Time spent: 35 minutes    [facility] Pager [phone]. If 7PM-7AM, please contact night-coverage at [email or web address], password [facility] 03/20/2015, 4:20 PM  LOS: 1 day

## 2015-03-20 NOTE — Discharge Summary (Signed)
Physician Discharge Summary  Roberto Sanchez IOE:[id number] DOB: 1945-12-05 DOA: 03/19/2015  PCP: Deloria Lair, MD  Admit date: 03/19/2015 Discharge date: 03/20/2015  Time spent: 40 minutes  Recommendations for Outpatient Follow-up:  1. PCP 1-2 weeks for evaluation of vertigo 2. Follow up with neurology as needed  Discharge Diagnoses:  Principal Problem:   Vertigo Active Problems:   Essential hypertension   Asthma, chronic   Anxiety   Discharge Condition: stable  Diet recommendation: heart healthy  Filed Weights   03/19/15 1553 03/19/15 1930 03/20/15 0544  Weight: 97.523 kg (215 lb) 97.523 kg (215 lb) 91.763 kg (202 lb 4.8 oz)    History of present illness:  This is a 69 year old man, hypertensive, who presented on 03/19/15 with dizziness which by his description was true vertigo. He  had this since approximately 8 AM that morning intermittently. His initial episode lasted almost 2-1/2 hours where he had to lie down. He did have an episode of vertigo lasting only 5 minutes the day before. He denied any diplopia or alteration in speech with the vertigo. There was no headache, nausea, vomiting, limb weakness or visual field defects. There was no word finding difficulties. Evaluation in the emergency room with a CT brain scan showed an old caudate head lacunar infarct and there was concern whether these symptoms represented a new acute CVA.   Hospital Course:  1. Vertigo. likely peripheral in origin. Much better this am. Received meclizine in ED. Not orthostatic.  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 MRI with no acute abnormalities. Evaluated by physical therapy who recommends no PT follow up. Educated to Epley maneuver as he reported episodes occurred when he was lying in bed on right side.  Await neurology input.  2. Hypertension. Fair control. Home meds include benazepril, losartan-HCTZ. 3. Asthma: stable at baseline. Continue home medications 4. Anxiety: home medications  include xanax TID prn. Urine +benzo. Appears stable at baseline. Continue xanax BID prn.   Procedures:  none  Consultations: neurology  Discharge Exam: Filed Vitals:   03/20/15 1315  BP: 111/69  Pulse: 77  Temp: 98.4 F (36.9 C)  Resp: 20    General: well nourished appears well Cardiovascular: RRR no MGR No LE edema Respiratory: normal effort BS clear bilaterally no wheeze no rhonchi Neuro: speech clear, facial symmetry, cranial nerve II-XII intact. Ambulating in room with steady gait  Discharge Instructions    Current Discharge Medication List    START taking these medications   Details  meclizine (ANTIVERT) 12.5 MG tablet Take 1 tablet (12.5 mg total) by mouth 2 (two) times daily as needed for dizziness. Qty: 30 tablet, Refills: 0      CONTINUE these medications which have NOT CHANGED   Details  ALPRAZolam (XANAX) 0.5 MG tablet Take 0.5 mg by mouth 2 (two) times daily as needed. Nerves/sleep    azelastine (ASTELIN) 137 MCG/SPRAY nasal spray Place 1 spray into the nose 2 (two) times daily. Use in each nostril as directed    benazepril (LOTENSIN) 20 MG tablet Take 20 mg by mouth daily.    Fluticasone-Salmeterol (ADVAIR) 250-50 MCG/DOSE AEPB Inhale 1 puff into the lungs every 12 (twelve) hours.    hydroxypropyl methylcellulose / hypromellose (ISOPTO TEARS / GONIOVISC) 2.5 % ophthalmic solution Place 1 drop into both eyes as needed for dry eyes.    ibuprofen (ADVIL,MOTRIN) 200 MG tablet Take 400 mg by mouth every 6 (six) hours as needed for mild pain.    losartan-hydrochlorothiazide (HYZAAR) 50-12.5 MG per  tablet Take 1 tablet by mouth daily. Refills: 11    montelukast (SINGULAIR) 10 MG tablet Take 10 mg by mouth daily.    Multiple Vitamin (MULTIVITAMIN WITH MINERALS) TABS tablet Take 1 tablet by mouth daily.    omeprazole (PRILOSEC) 20 MG capsule Take 20 mg by mouth daily. Refills: 3      STOP taking these medications     naproxen sodium (ANAPROX) 220 MG  tablet        Allergies  Allergen Reactions  . Shellfish Allergy Swelling    Facial Swelling   . Penicillins Rash   Follow-up Information    Follow up with TAPPER,DAVID B, MD. Schedule an appointment as soon as possible for a visit in 1 week.   Specialty:  Family Medicine   Why:  for evaluation of vertigo   Contact information:   [place] [address] [phone]        The results of significant diagnostics from this hospitalization (including imaging, microbiology, ancillary and laboratory) are listed below for reference.    Significant Diagnostic Studies: Ct Head Wo Contrast  03/19/2015   CLINICAL DATA:  Dizziness.  Syncope.  Near syncope.  EXAM: CT HEAD WITHOUT CONTRAST  TECHNIQUE: Contiguous axial images were obtained from the base of the skull through the vertex without intravenous contrast.  COMPARISON:  None.  FINDINGS: No mass lesion, mass effect, midline shift, hydrocephalus, hemorrhage. No territorial ischemia or acute infarction. Tiny LEFT caudate head lacunar infarct. 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  IMPRESSION: No acute intracranial abnormality. 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   Electronically Signed   By: Dereck Ligas M.D.   On: 03/19/2015 17:54   Mr Brain Wo Contrast  03/20/2015   CLINICAL DATA:  69 year old male with syncope and vertigo. Initial encounter.  EXAM: MRI HEAD WITHOUT CONTRAST  TECHNIQUE: Multiplanar, multiecho pulse sequences of the brain and surrounding structures were obtained without intravenous contrast.  COMPARISON:  Head CT without contrast 03/19/2015.  FINDINGS: Cerebral volume is within normal limits for age. No restricted diffusion to suggest acute infarction. No midline shift, mass effect, evidence of mass lesion, ventriculomegaly, extra-axial collection or acute intracranial hemorrhage. Cervicomedullary junction and pituitary are within normal limits. Major intracranial vascular flow voids are preserved, with dominant  appearing distal right vertebral artery.  Chronic caudate lacunar infarct as seen on the recent head CT. Associated hemosiderin. Elsewhere normal for age gray and white matter signal.  Visible internal auditory structures appear normal. Mastoids are clear.  Trace paranasal sinus mucosal thickening. Visualized orbit soft tissues are within normal limits. Visualized scalp soft tissues are within normal limits. Normal bone marrow signal.  IMPRESSION: 1.  No acute intracranial abnormality. 2. 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   Electronically Signed   By: Genevie Ann M.D.   On: 03/20/2015 09:01    Microbiology: No results found for this or any previous visit (from the past 240 hour(s)).   Labs: Basic Metabolic Panel:  Recent Labs Lab 03/19/15 1630  NA 139  K 4.1  CL 104  CO2 29  GLUCOSE 109*  BUN 18  CREATININE 0.83  CALCIUM 9.0   Liver Function Tests:  Recent Labs Lab 03/19/15 1630  AST 24  ALT 29  ALKPHOS 64  BILITOT 0.7  PROT 7.0  ALBUMIN 4.1   No results for input(s): LIPASE, AMYLASE in the last 168 hours. No results for input(s): AMMONIA in the last  168 hours. CBC:  Recent Labs Lab 03/19/15 1630  WBC 11.2*  NEUTROABS 8.6*  HGB 15.7  HCT 45.6  MCV 95.6  PLT 332   Cardiac Enzymes: No results for input(s): CKTOTAL, CKMB, CKMBINDEX, TROPONINI in the last 168 hours. BNP: BNP (last 3 results) No results for input(s): BNP in the last 8760 hours.  ProBNP (last 3 results) No results for input(s): PROBNP in the last 8760 hours.  CBG: No results for input(s): GLUCAP in the last 168 hours.     SignedCristal Ford  [facility] 03/20/2015, 6:44 PM

## 2015-03-20 NOTE — Evaluation (Signed)
Physical Therapy Evaluation Patient Details Name: Roberto Sanchez MRN: [id number] DOB: [birth year] Today's Date: 03/20/2015   History of Present Illness  This is a 69 year old man, hypertensive, who presents with dizziness which by his description is true vertigo. He has had this since approximately 8 AM this morning intermittently. His initial episode lasted almost 2-1/2 hours where he had to lie down. He did have an episode of vertigo lasting only 5 minutes yesterday. He denied any diplopia or alteration in speech with the vertigo. There is no headache, nausea, vomiting, limb weakness or visual field defects. There is no word finding difficulties. Evaluation in the emergency room with a CT brain scan showed an old caudate head lacunar infarct and there is concern whether these symptoms represent a new acute CVA. He is now being admitted for further investigation.  Subsequent MRI is negative for stroke.  Clinical Impression  Pt is seen for vestibular evaluation.  He reports only a momentary episode of dizziness this morning.  He has been up ad lib in the room with no problems.  His strength and balance are WNL.  No nystagmus is noted nor are there any visual deviations evidenced.  Head motions do not reproduce dizziness nor do positional changes from lying to sitting or standing with full trunk flexion.  He does report that several of these dizzy episodes occurred when he was supine in bed and rolled onto his right side.  I therefore have instructed him in the Epley maneuver to try at home if this dizziness recurs.  He was given a written home program.  He was also instructed to see his personal MD if this maneuver does not help and he might then get a referral for OP PT for vestibular rehab.    Follow Up Recommendations No PT follow up    Equipment Recommendations  None recommended by PT    Recommendations for Other Services   none    Precautions / Restrictions Precautions Precautions:  None Restrictions Weight Bearing Restrictions: No      Mobility  Bed Mobility Overal bed mobility: Independent                Transfers Overall transfer level: Independent                  Ambulation/Gait Ambulation/Gait assistance: Independent   Assistive device: None Gait Pattern/deviations: WFL(Within Functional Limits)   Gait velocity interpretation: at or above normal speed for age/gender General Gait Details: no dizziness with gait  Stairs            Wheelchair Mobility    Modified Rankin (Stroke Patients Only)       Balance Overall balance assessment: Independent                                           Pertinent Vitals/Pain Pain Assessment: No/denies pain    Home Living Family/patient expects to be discharged to:: Private residence Living Arrangements: Spouse/significant other Available Help at Discharge: Family;Available 24 hours/day Type of Home: House       Home Layout: One level Home Equipment: None      Prior Function Level of Independence: Independent               Hand Dominance        Extremity/Trunk Assessment   Upper Extremity Assessment: Overall WFL for tasks assessed  Lower Extremity Assessment: Overall WFL for tasks assessed      Cervical / Trunk Assessment: Normal  Communication   Communication: No difficulties  Cognition Arousal/Alertness: Awake/alert Behavior During Therapy: WFL for tasks assessed/performed Overall Cognitive Status: Within Functional Limits for tasks assessed                      General Comments      Exercises        Assessment/Plan    PT Assessment Patent does not need any further PT services  PT Diagnosis     PT Problem List    PT Treatment Interventions     PT Goals (Current goals can be found in the Care Plan section) Acute Rehab PT Goals PT Goal Formulation: All assessment and education complete, DC therapy     Frequency     Barriers to discharge        Co-evaluation               End of Session Equipment Utilized During Treatment: Gait belt Activity Tolerance: Patient tolerated treatment well Patient left: in chair;with call bell/phone within reach;with family/visitor present Nurse Communication: Mobility status    Functional Assessment Tool Used: clinical judgement Functional Limitation: Mobility: Walking and moving around Mobility: Walking and Moving Around Current Status [phone]): 0 percent impaired, limited or restricted Mobility: Walking and Moving Around Goal Status [phone]): 0 percent impaired, limited or restricted Mobility: Walking and Moving Around Discharge Status [phone]): 0 percent impaired, limited or restricted    Time: 1252-1320 PT Time Calculation (min) (ACUTE ONLY): 28 min   Charges:   PT Evaluation $Initial PT Evaluation Tier I: 1 Procedure     PT G Codes:   PT G-Codes **NOT FOR INPATIENT CLASS** Functional Assessment Tool Used: clinical judgement Functional Limitation: Mobility: Walking and moving around Mobility: Walking and Moving Around Current Status (J0932): 0 percent impaired, limited or restricted Mobility: Walking and Moving Around Goal Status (I7124): 0 percent impaired, limited or restricted Mobility: Walking and Moving Around Discharge Status (P8099): 0 percent impaired, limited or restricted    Demetrios Isaacs L 03/20/2015, 1:26 PM

## 2015-03-20 NOTE — Progress Notes (Signed)
Patient reports another dizzy spell this morning that only lasted 2 minutes. Patient encouraged to not get up alone. His neuro checks are normal.

## 2015-03-20 NOTE — Progress Notes (Signed)
Patient up in the chair . No c/o dizziness voiced.

## 2015-03-21 LAB — FOLATE RBC
Folate, Hemolysate: >620 ng/mL
Folate, RBC: >1445 ng/mL (ref >498)
Hematocrit: 42.9 % (ref 37.5–51.0)

## 2015-03-22 NOTE — Care Management Utilization Note (Signed)
UR completed 

## 2015-08-17 ENCOUNTER — Emergency Department (HOSPITAL_COMMUNITY)
Admission: EM | Admit: 2015-08-17 | Discharge: 2015-08-17 | Disposition: A | Discharge status: Home / Self Care | Payer: Medicare Other | Attending: Emergency Medicine | Admitting: Emergency Medicine

## 2015-08-17 ENCOUNTER — Emergency Department (HOSPITAL_COMMUNITY): Payer: Medicare Other

## 2015-08-17 ENCOUNTER — Encounter (HOSPITAL_COMMUNITY): Payer: Self-pay | Admitting: Emergency Medicine

## 2015-08-17 DIAGNOSIS — Z7951 Long term (current) use of inhaled steroids: Secondary | ICD-10-CM | POA: Insufficient documentation

## 2015-08-17 DIAGNOSIS — X58XXXA Exposure to other specified factors, initial encounter: Secondary | ICD-10-CM | POA: Diagnosis not present

## 2015-08-17 DIAGNOSIS — Y9389 Activity, other specified: Secondary | ICD-10-CM | POA: Diagnosis not present

## 2015-08-17 DIAGNOSIS — I10 Essential (primary) hypertension: Secondary | ICD-10-CM | POA: Insufficient documentation

## 2015-08-17 DIAGNOSIS — F419 Anxiety disorder, unspecified: Secondary | ICD-10-CM | POA: Insufficient documentation

## 2015-08-17 DIAGNOSIS — Y998 Other external cause status: Secondary | ICD-10-CM | POA: Insufficient documentation

## 2015-08-17 DIAGNOSIS — S46301A Unspecified injury of muscle, fascia and tendon of triceps, right arm, initial encounter: Secondary | ICD-10-CM | POA: Diagnosis not present

## 2015-08-17 DIAGNOSIS — Z79899 Other long term (current) drug therapy: Secondary | ICD-10-CM | POA: Insufficient documentation

## 2015-08-17 DIAGNOSIS — Z8546 Personal history of malignant neoplasm of prostate: Secondary | ICD-10-CM | POA: Insufficient documentation

## 2015-08-17 DIAGNOSIS — J45909 Unspecified asthma, uncomplicated: Secondary | ICD-10-CM | POA: Insufficient documentation

## 2015-08-17 DIAGNOSIS — S46391A Other injury of muscle, fascia and tendon of triceps, right arm, initial encounter: Secondary | ICD-10-CM

## 2015-08-17 DIAGNOSIS — Y9289 Other specified places as the place of occurrence of the external cause: Secondary | ICD-10-CM | POA: Diagnosis not present

## 2015-08-17 DIAGNOSIS — S4991XA Unspecified injury of right shoulder and upper arm, initial encounter: Secondary | ICD-10-CM | POA: Diagnosis present

## 2015-08-17 DIAGNOSIS — Z880 Allergy status to penicillin: Secondary | ICD-10-CM | POA: Insufficient documentation

## 2015-08-17 DIAGNOSIS — Z87891 Personal history of nicotine dependence: Secondary | ICD-10-CM | POA: Diagnosis not present

## 2015-08-17 NOTE — ED Provider Notes (Signed)
CSN: [id number]     Arrival date & time 08/17/15  1507 History   First MD Initiated Contact with Patient 08/17/15 1521     Chief Complaint  Patient presents with  . Arm Pain     (Consider location/radiation/quality/duration/timing/severity/associated sxs/prior Treatment) Patient is a 69 y.o. male presenting with arm pain. The history is provided by the patient.  Arm Pain Pertinent negatives include no chest pain, no abdominal pain, no headaches and no shortness of breath.   patient with complaint of right upper arm pain. Minimal if he doesn't move the arm at the elbow or shoulder. Pain is increased with movement. Noticed it this morning while he was lifting weights. Patient felt a sudden pop at the right elbow and then had trouble with some movement of the arm. He was doing weight lifting at the time and working on the tricep muscle. No other injuries. Patient is not on blood thinners.  Past Medical History  Diagnosis Date  . Hypertension   . Asthma   . Prostate ca   . Anxiety    Past Surgical History  Procedure Laterality Date  . Prostate surgery    . Nasal sinus surgery     No family history on file. Social History  Substance Use Topics  . Smoking status: Former Research scientist (life sciences)  . Smokeless tobacco: None  . Alcohol Use: Yes     Comment: occ    Review of Systems  Constitutional: Negative for fever.  HENT: Negative for congestion.   Eyes: Negative for visual disturbance.  Respiratory: Negative for shortness of breath.   Cardiovascular: Negative for chest pain.  Gastrointestinal: Negative for abdominal pain.  Musculoskeletal: Positive for joint swelling. Negative for back pain and neck pain.  Skin: Negative for wound.  Neurological: Negative for headaches.  Hematological: Does not bruise/bleed easily.  Psychiatric/Behavioral: Negative for confusion.      Allergies  Shellfish allergy and Penicillins  Home Medications   Prior to Admission medications   Medication Sig  Start Date End Date Taking? Authorizing Provider  ALPRAZolam Duanne Moron) 0.5 MG tablet Take 0.5 mg by mouth 2 (two) times daily as needed. Nerves/sleep    Historical Provider, MD  azelastine (ASTELIN) 137 MCG/SPRAY nasal spray Place 1 spray into the nose 2 (two) times daily. Use in each nostril as directed    Historical Provider, MD  benazepril (LOTENSIN) 20 MG tablet Take 20 mg by mouth daily.    Historical Provider, MD  Fluticasone-Salmeterol (ADVAIR) 250-50 MCG/DOSE AEPB Inhale 1 puff into the lungs every 12 (twelve) hours.    Historical Provider, MD  hydroxypropyl methylcellulose / hypromellose (ISOPTO TEARS / GONIOVISC) 2.5 % ophthalmic solution Place 1 drop into both eyes as needed for dry eyes.    Historical Provider, MD  ibuprofen (ADVIL,MOTRIN) 200 MG tablet Take 400 mg by mouth every 6 (six) hours as needed for mild pain.    Historical Provider, MD  losartan-hydrochlorothiazide (HYZAAR) 100-12.5 MG per tablet TK 1 T PO QD IN THE MORNING FOR BLOOD PRESSURE 07/17/15   Historical Provider, MD  losartan-hydrochlorothiazide (HYZAAR) 50-12.5 MG per tablet Take 1 tablet by mouth daily. 03/09/15   Historical Provider, MD  meclizine (ANTIVERT) 12.5 MG tablet Take 1 tablet (12.5 mg total) by mouth 2 (two) times daily as needed for dizziness. 03/20/15   Radene Gunning, NP  montelukast (SINGULAIR) 10 MG tablet Take 10 mg by mouth daily. 03/18/15   Historical Provider, MD  Multiple Vitamin (MULTIVITAMIN WITH MINERALS) TABS tablet Take 1 tablet  by mouth daily.    Historical Provider, MD  omeprazole (PRILOSEC) 20 MG capsule Take 20 mg by mouth daily. 01/27/15   Historical Provider, MD   BP 184/86 mmHg  Pulse 83  Temp(Src) 97.8 F (36.6 C) (Oral)  Resp 16  SpO2 98% Physical Exam  Constitutional: He is oriented to person, place, and time. He appears well-developed and well-nourished. He appears distressed.  HENT:  Head: Normocephalic and atraumatic.  Mouth/Throat: Oropharynx is clear and moist.  Eyes:  Conjunctivae and EOM are normal. Pupils are equal, round, and reactive to light.  Neck: Normal range of motion.  Cardiovascular: Normal rate, regular rhythm and normal heart sounds.   No murmur heard. Pulmonary/Chest: Effort normal and breath sounds normal. No respiratory distress.  Abdominal: Soft. Bowel sounds are normal. There is no tenderness.  Musculoskeletal: He exhibits edema and tenderness.  Normal except for right arm radial pulse 2+ sensation of fingers intact good movement of fingers. Patient able to extend at the elbow but with some weakness. Fullness at the distal aspect of the humerus. Some tightening of the tricep muscle. Tendon must be intact because he can extend the arm. But suspicious for tricep muscle tear. No bruising noted.  Neurological: He is oriented to person, place, and time. No cranial nerve deficit. He exhibits normal muscle tone. Coordination normal.  Skin: Skin is warm. No erythema.  Nursing note and vitals reviewed.   ED Course  Procedures (including critical care time) Labs Review Labs Reviewed - No data to display  Imaging Review Dg Humerus Right  08/17/2015   CLINICAL DATA:  Pain rt tricep area after working out on the tricep machine this morning. Pain if he tries to raise arm.  EXAM: RIGHT HUMERUS - 2+ VIEW  COMPARISON:  None.  FINDINGS: There is a fracture of the olecranon, along its proximal aspect, but anterior to an enthesophyte at the triceps insertion. This would be better evaluated on elbow radiographs.  No other evidence of a fracture. The elbow and shoulder joints are normally aligned. There are mild arthropathic changes of the right glenohumeral joint with inferior marginal spurring.  Soft tissues are unremarkable.  IMPRESSION: Fracture of the olecranon likely involving at least a portion of the triceps muscle or tendon insertion. Recommend followup probable radiographs. No other acute finding.   Electronically Signed   By: Lajean Manes M.D.   On:  08/17/2015 16:02   I have personally reviewed and evaluated these images and lab results as part of my medical decision-making.   EKG Interpretation None      MDM   Final diagnoses:  Inj muscle, fascia and tendon of triceps, right arm, init    Based on x-ray and clinical exam patient has an injury to the right tricep muscle. There is evidence of part of the bone being pulled off of the olecranon. Patient has an orthopedic doctor in the [place] area. Patient treated with sling did not want any pain medicine he will follow-up with the orthopedic doctor.    Fredia Sorrow, MD 08/17/15 [phone]

## 2015-08-17 NOTE — ED Notes (Addendum)
Pt c/o upper RT arm pain. Pain increases with movement. Pt states he noticed it this morning when he was lifting weights. Denies CP.

## 2015-08-17 NOTE — Discharge Instructions (Signed)
Wear the sling on the right arm as directed. Make an appointment to follow-up with your orthopedic doctor. Return for any new or worse symptoms.

## 2015-08-30 ENCOUNTER — Other Ambulatory Visit: Payer: Self-pay | Admitting: Orthopedic Surgery

## 2015-08-30 ENCOUNTER — Encounter (HOSPITAL_BASED_OUTPATIENT_CLINIC_OR_DEPARTMENT_OTHER): Payer: Self-pay | Admitting: *Deleted

## 2015-09-01 ENCOUNTER — Ambulatory Visit (HOSPITAL_BASED_OUTPATIENT_CLINIC_OR_DEPARTMENT_OTHER)
Admission: RE | Admit: 2015-09-01 | Discharge: 2015-09-01 | Disposition: A | Discharge status: Home / Self Care | Payer: Medicare Other | Source: Ambulatory Visit | Attending: Orthopedic Surgery | Admitting: Orthopedic Surgery

## 2015-09-01 ENCOUNTER — Ambulatory Visit (HOSPITAL_BASED_OUTPATIENT_CLINIC_OR_DEPARTMENT_OTHER): Payer: Medicare Other | Admitting: Anesthesiology

## 2015-09-01 ENCOUNTER — Encounter (HOSPITAL_BASED_OUTPATIENT_CLINIC_OR_DEPARTMENT_OTHER)
Admission: RE | Disposition: A | Discharge status: Home / Self Care | Payer: Self-pay | Source: Ambulatory Visit | Attending: Orthopedic Surgery

## 2015-09-01 ENCOUNTER — Encounter (HOSPITAL_BASED_OUTPATIENT_CLINIC_OR_DEPARTMENT_OTHER): Payer: Self-pay

## 2015-09-01 DIAGNOSIS — X500XXA Overexertion from strenuous movement or load, initial encounter: Secondary | ICD-10-CM | POA: Diagnosis not present

## 2015-09-01 DIAGNOSIS — Z7983 Long term (current) use of bisphosphonates: Secondary | ICD-10-CM | POA: Insufficient documentation

## 2015-09-01 DIAGNOSIS — Z880 Allergy status to penicillin: Secondary | ICD-10-CM | POA: Diagnosis not present

## 2015-09-01 DIAGNOSIS — K219 Gastro-esophageal reflux disease without esophagitis: Secondary | ICD-10-CM | POA: Diagnosis not present

## 2015-09-01 DIAGNOSIS — Z91013 Allergy to seafood: Secondary | ICD-10-CM | POA: Diagnosis not present

## 2015-09-01 DIAGNOSIS — S46311A Strain of muscle, fascia and tendon of triceps, right arm, initial encounter: Secondary | ICD-10-CM | POA: Diagnosis present

## 2015-09-01 DIAGNOSIS — J45909 Unspecified asthma, uncomplicated: Secondary | ICD-10-CM | POA: Diagnosis not present

## 2015-09-01 DIAGNOSIS — M1990 Unspecified osteoarthritis, unspecified site: Secondary | ICD-10-CM | POA: Diagnosis not present

## 2015-09-01 DIAGNOSIS — F419 Anxiety disorder, unspecified: Secondary | ICD-10-CM | POA: Insufficient documentation

## 2015-09-01 DIAGNOSIS — I10 Essential (primary) hypertension: Secondary | ICD-10-CM | POA: Insufficient documentation

## 2015-09-01 DIAGNOSIS — Z8546 Personal history of malignant neoplasm of prostate: Secondary | ICD-10-CM | POA: Diagnosis not present

## 2015-09-01 DIAGNOSIS — Z87891 Personal history of nicotine dependence: Secondary | ICD-10-CM | POA: Diagnosis not present

## 2015-09-01 DIAGNOSIS — Y9359 Activity, other involving other sports and athletics played individually: Secondary | ICD-10-CM | POA: Diagnosis not present

## 2015-09-01 DIAGNOSIS — Z79899 Other long term (current) drug therapy: Secondary | ICD-10-CM | POA: Diagnosis not present

## 2015-09-01 HISTORY — DX: Unspecified osteoarthritis, unspecified site: M19.90

## 2015-09-01 HISTORY — DX: Gastro-esophageal reflux disease without esophagitis: K21.9

## 2015-09-01 HISTORY — PX: TRICEPS TENDON REPAIR: SHX2577

## 2015-09-01 HISTORY — DX: Strain of muscle, fascia and tendon of triceps, right arm, initial encounter: S46.311A

## 2015-09-01 LAB — POCT I-STAT, CHEM 8
BUN: 23 mg/dL — AB (ref 6–20)
CALCIUM ION: 1.13 mmol/L (ref 1.13–1.30)
CHLORIDE: 102 mmol/L (ref 101–111)
CREATININE: 0.8 mg/dL (ref 0.61–1.24)
GLUCOSE: 105 mg/dL — AB (ref 65–99)
HCT: 48 % (ref 39.0–52.0)
Hemoglobin: 16.3 g/dL (ref 13.0–17.0)
Potassium: 4 mmol/L (ref 3.5–5.1)
Sodium: 138 mmol/L (ref 135–145)
TCO2: 22 mmol/L (ref 0–100)

## 2015-09-01 SURGERY — REPAIR, TENDON, TRICEPS
Anesthesia: General | Laterality: Right

## 2015-09-01 MED ORDER — CEFAZOLIN SODIUM-DEXTROSE 2-3 GM-% IV SOLR
INTRAVENOUS | Status: AC
  Filled 2015-09-01: qty 50

## 2015-09-01 MED ORDER — ONDANSETRON HCL 4 MG/2ML IJ SOLN
INTRAMUSCULAR | Status: DC | PRN
  Administered 2015-09-01: 4 mg via INTRAVENOUS

## 2015-09-01 MED ORDER — BACLOFEN 10 MG PO TABS: 10.0000 mg | ORAL_TABLET | Freq: Three times a day (TID) | ORAL | Status: DC

## 2015-09-01 MED ORDER — HYDROMORPHONE HCL 1 MG/ML IJ SOLN
INTRAMUSCULAR | Status: AC
  Filled 2015-09-01: qty 1

## 2015-09-01 MED ORDER — KETOROLAC TROMETHAMINE 30 MG/ML IJ SOLN
30.0000 mg | Freq: Once | INTRAMUSCULAR | Status: AC
  Administered 2015-09-01: 30 mg via INTRAVENOUS

## 2015-09-01 MED ORDER — ONDANSETRON HCL 4 MG PO TABS: 4.0000 mg | ORAL_TABLET | Freq: Three times a day (TID) | ORAL | Status: DC | PRN

## 2015-09-01 MED ORDER — BUPIVACAINE HCL (PF) 0.5 % IJ SOLN
INTRAMUSCULAR | Status: DC | PRN
  Administered 2015-09-01: 10 mL

## 2015-09-01 MED ORDER — LACTATED RINGERS IV SOLN
INTRAVENOUS | Status: DC
  Administered 2015-09-01: 10 mL/h via INTRAVENOUS
  Administered 2015-09-01 (×2): via INTRAVENOUS

## 2015-09-01 MED ORDER — SUCCINYLCHOLINE CHLORIDE 20 MG/ML IJ SOLN
INTRAMUSCULAR | Status: DC | PRN
  Administered 2015-09-01: 100 mg via INTRAVENOUS

## 2015-09-01 MED ORDER — PROPOFOL 10 MG/ML IV BOLUS
INTRAVENOUS | Status: DC | PRN
  Administered 2015-09-01: 50 mg via INTRAVENOUS
  Administered 2015-09-01: 200 mg via INTRAVENOUS

## 2015-09-01 MED ORDER — 0.9 % SODIUM CHLORIDE (POUR BTL) OPTIME
TOPICAL | Status: DC | PRN
  Administered 2015-09-01: 300 mL

## 2015-09-01 MED ORDER — MIDAZOLAM HCL 2 MG/2ML IJ SOLN
1.0000 mg | INTRAMUSCULAR | Status: DC | PRN
Start: 2015-09-01 — End: 2015-09-01

## 2015-09-01 MED ORDER — ONDANSETRON HCL 4 MG/2ML IJ SOLN
INTRAMUSCULAR | Status: AC
  Filled 2015-09-01: qty 2

## 2015-09-01 MED ORDER — GLYCOPYRROLATE 0.2 MG/ML IJ SOLN: 0.2000 mg | Freq: Once | INTRAMUSCULAR | Status: DC | PRN

## 2015-09-01 MED ORDER — FENTANYL CITRATE (PF) 100 MCG/2ML IJ SOLN
INTRAMUSCULAR | Status: AC
  Filled 2015-09-01: qty 4

## 2015-09-01 MED ORDER — BUPIVACAINE HCL (PF) 0.5 % IJ SOLN
INTRAMUSCULAR | Status: AC
  Filled 2015-09-01: qty 30

## 2015-09-01 MED ORDER — KETOROLAC TROMETHAMINE 30 MG/ML IJ SOLN
INTRAMUSCULAR | Status: AC
  Filled 2015-09-01: qty 1

## 2015-09-01 MED ORDER — PROPOFOL 10 MG/ML IV BOLUS
INTRAVENOUS | Status: AC
  Filled 2015-09-01: qty 20

## 2015-09-01 MED ORDER — LIDOCAINE HCL (CARDIAC) 20 MG/ML IV SOLN
INTRAVENOUS | Status: AC
  Filled 2015-09-01: qty 5

## 2015-09-01 MED ORDER — PROMETHAZINE HCL 25 MG/ML IJ SOLN: 6.2500 mg | INTRAMUSCULAR | Status: DC | PRN

## 2015-09-01 MED ORDER — CEFAZOLIN SODIUM-DEXTROSE 2-3 GM-% IV SOLR
2.0000 g | INTRAVENOUS | Status: AC
  Administered 2015-09-01: 2 g via INTRAVENOUS

## 2015-09-01 MED ORDER — SENNA-DOCUSATE SODIUM 8.6-50 MG PO TABS: 2.0000 | ORAL_TABLET | Freq: Every day | ORAL | Status: DC

## 2015-09-01 MED ORDER — FENTANYL CITRATE (PF) 100 MCG/2ML IJ SOLN
50.0000 ug | INTRAMUSCULAR | Status: AC | PRN
  Administered 2015-09-01: 100 ug via INTRAVENOUS
  Administered 2015-09-01 (×4): 50 ug via INTRAVENOUS

## 2015-09-01 MED ORDER — ARTIFICIAL TEARS OP OINT
TOPICAL_OINTMENT | OPHTHALMIC | Status: AC
  Filled 2015-09-01: qty 3.5

## 2015-09-01 MED ORDER — OXYCODONE-ACETAMINOPHEN 5-325 MG PO TABS: 1.0000 | ORAL_TABLET | Freq: Four times a day (QID) | ORAL | Status: DC | PRN

## 2015-09-01 MED ORDER — DEXAMETHASONE SODIUM PHOSPHATE 4 MG/ML IJ SOLN
INTRAMUSCULAR | Status: DC | PRN
  Administered 2015-09-01: 10 mg via INTRAVENOUS

## 2015-09-01 MED ORDER — HYDROMORPHONE HCL 1 MG/ML IJ SOLN
0.2500 mg | INTRAMUSCULAR | Status: DC | PRN
Start: 2015-09-01 — End: 2015-09-01
  Administered 2015-09-01 (×3): 0.5 mg via INTRAVENOUS

## 2015-09-01 MED ORDER — SCOPOLAMINE 1 MG/3DAYS TD PT72: 1.0000 | MEDICATED_PATCH | Freq: Once | TRANSDERMAL | Status: DC | PRN

## 2015-09-01 SURGICAL SUPPLY — 76 items
ANCHOR PUSHLOCK PEEK 3.5X19.5 (Anchor) ×3 IMPLANT
BANDAGE ELASTIC 4 VELCRO ST LF (GAUZE/BANDAGES/DRESSINGS) ×6 IMPLANT
BIT DRILL 5/64X5 DISP (BIT) ×3 IMPLANT
BLADE SURG 10 STRL SS (BLADE) ×3 IMPLANT
BLADE SURG 15 STRL LF DISP TIS (BLADE) ×1 IMPLANT
BLADE SURG 15 STRL SS (BLADE) ×2
BNDG COHESIVE 4X5 TAN STRL (GAUZE/BANDAGES/DRESSINGS) ×3 IMPLANT
BNDG ESMARK 4X9 LF (GAUZE/BANDAGES/DRESSINGS) ×3 IMPLANT
CANISTER SUCT 1200ML W/VALVE (MISCELLANEOUS) ×3 IMPLANT
CLOSURE STERI-STRIP 1/2X4 (GAUZE/BANDAGES/DRESSINGS) ×1
CLSR STERI-STRIP ANTIMIC 1/2X4 (GAUZE/BANDAGES/DRESSINGS) ×2 IMPLANT
CORDS BIPOLAR (ELECTRODE) ×3 IMPLANT
COVER BACK TABLE 60X90IN (DRAPES) ×3 IMPLANT
CUFF TOURN SGL LL 18 NRW (TOURNIQUET CUFF) ×3 IMPLANT
CUFF TOURNIQUET SINGLE 18IN (TOURNIQUET CUFF) IMPLANT
DECANTER SPIKE VIAL GLASS SM (MISCELLANEOUS) IMPLANT
DRAPE EXTREMITY T 121X128X90 (DRAPE) ×3 IMPLANT
DRAPE OEC MINIVIEW 54X84 (DRAPES) IMPLANT
DRAPE U 20/CS (DRAPES) ×3 IMPLANT
DRAPE U-SHAPE 47X51 STRL (DRAPES) ×3 IMPLANT
DURAPREP 26ML APPLICATOR (WOUND CARE) ×3 IMPLANT
ELECT NEEDLE TIP 2.8 STRL (NEEDLE) IMPLANT
ELECT REM PT RETURN 9FT ADLT (ELECTROSURGICAL) ×3
ELECTRODE REM PT RTRN 9FT ADLT (ELECTROSURGICAL) ×1 IMPLANT
GAUZE SPONGE 4X4 12PLY STRL (GAUZE/BANDAGES/DRESSINGS) ×3 IMPLANT
GAUZE XEROFORM 1X8 LF (GAUZE/BANDAGES/DRESSINGS) IMPLANT
GLOVE BIO SURGEON STRL SZ8 (GLOVE) ×3 IMPLANT
GLOVE BIOGEL PI IND STRL 7.0 (GLOVE) ×1 IMPLANT
GLOVE BIOGEL PI IND STRL 8 (GLOVE) ×2 IMPLANT
GLOVE BIOGEL PI INDICATOR 7.0 (GLOVE) ×2
GLOVE BIOGEL PI INDICATOR 8 (GLOVE) ×4
GLOVE ECLIPSE 6.5 STRL STRAW (GLOVE) ×3 IMPLANT
GLOVE EXAM NITRILE MD LF STRL (GLOVE) ×3 IMPLANT
GLOVE ORTHO TXT STRL SZ7.5 (GLOVE) ×3 IMPLANT
GOWN STRL REUS W/ TWL LRG LVL3 (GOWN DISPOSABLE) ×1 IMPLANT
GOWN STRL REUS W/ TWL XL LVL3 (GOWN DISPOSABLE) ×2 IMPLANT
GOWN STRL REUS W/TWL LRG LVL3 (GOWN DISPOSABLE) ×2
GOWN STRL REUS W/TWL XL LVL3 (GOWN DISPOSABLE) ×4
NDL SUT 6 .5 CRC .975X.05 MAYO (NEEDLE) ×1 IMPLANT
NEEDLE HYPO 25X1 1.5 SAFETY (NEEDLE) IMPLANT
NEEDLE MAYO TAPER (NEEDLE) ×2
NS IRRIG 1000ML POUR BTL (IV SOLUTION) ×3 IMPLANT
PACK BASIN DAY SURGERY FS (CUSTOM PROCEDURE TRAY) ×3 IMPLANT
PAD CAST 4YDX4 CTTN HI CHSV (CAST SUPPLIES) ×1 IMPLANT
PADDING CAST ABS 4INX4YD NS (CAST SUPPLIES)
PADDING CAST ABS COTTON 4X4 ST (CAST SUPPLIES) IMPLANT
PADDING CAST COTTON 4X4 STRL (CAST SUPPLIES) ×2
PENCIL BUTTON HOLSTER BLD 10FT (ELECTRODE) ×3 IMPLANT
SLEEVE SCD COMPRESS KNEE MED (MISCELLANEOUS) ×3 IMPLANT
SLING ARM LRG ADULT FOAM STRAP (SOFTGOODS) IMPLANT
SLING ARM MED ADULT FOAM STRAP (SOFTGOODS) IMPLANT
SPLINT FAST PLASTER 5X30 (CAST SUPPLIES) ×20
SPLINT PLASTER CAST FAST 5X30 (CAST SUPPLIES) ×10 IMPLANT
SPONGE LAP 18X18 X RAY DECT (DISPOSABLE) ×3 IMPLANT
SPONGE LAP 4X18 X RAY DECT (DISPOSABLE) ×3 IMPLANT
STOCKINETTE 4X48 STRL (DRAPES) IMPLANT
STOCKINETTE IMPERVIOUS LG (DRAPES) ×3 IMPLANT
SUCTION FRAZIER TIP 10 FR DISP (SUCTIONS) ×3 IMPLANT
SUT 2 FIBERLOOP 20 STRT BLUE (SUTURE)
SUT FIBERWIRE #2 38 T-5 BLUE (SUTURE) ×6
SUT MNCRL AB 3-0 PS2 18 (SUTURE) IMPLANT
SUT MNCRL AB 4-0 PS2 18 (SUTURE) IMPLANT
SUT VIC AB 0 CT1 27 (SUTURE) ×4
SUT VIC AB 0 CT1 27XBRD ANBCTR (SUTURE) ×2 IMPLANT
SUT VIC AB 2-0 SH 27 (SUTURE)
SUT VIC AB 2-0 SH 27XBRD (SUTURE) IMPLANT
SUT VICRYL 3-0 CR8 SH (SUTURE) ×3 IMPLANT
SUTURE 2 FIBERLOOP 20 STRT BLU (SUTURE) IMPLANT
SUTURE FIBERWR #2 38 T-5 BLUE (SUTURE) ×2 IMPLANT
SYR BULB 3OZ (MISCELLANEOUS) ×3 IMPLANT
SYR CONTROL 10ML LL (SYRINGE) ×3 IMPLANT
TOWEL OR 17X24 6PK STRL BLUE (TOWEL DISPOSABLE) ×6 IMPLANT
TOWEL OR NON WOVEN STRL DISP B (DISPOSABLE) ×3 IMPLANT
TUBE CONNECTING 20'X1/4 (TUBING) ×1
TUBE CONNECTING 20X1/4 (TUBING) ×2 IMPLANT
UNDERPAD 30X30 (UNDERPADS AND DIAPERS) IMPLANT

## 2015-09-01 NOTE — Anesthesia Preprocedure Evaluation (Signed)
Anesthesia Evaluation  Patient identified by MRN, date of birth, ID band Patient awake    Reviewed: Allergy & Precautions, NPO status , Patient's Chart, lab work & pertinent test results  Airway Mallampati: II  TM Distance: >3 FB Neck ROM: Full    Dental no notable dental hx.    Pulmonary asthma , former smoker,    Pulmonary exam normal breath sounds clear to auscultation       Cardiovascular hypertension, Pt. on medications Normal cardiovascular exam Rhythm:Regular Rate:Normal     Neuro/Psych negative neurological ROS  negative psych ROS   GI/Hepatic Neg liver ROS, GERD  Medicated,  Endo/Other  negative endocrine ROS  Renal/GU negative Renal ROS  negative genitourinary   Musculoskeletal negative musculoskeletal ROS (+)   Abdominal   Peds negative pediatric ROS (+)  Hematology negative hematology ROS (+)   Anesthesia Other Findings   Reproductive/Obstetrics negative OB ROS                             Anesthesia Physical Anesthesia Plan  ASA: II  Anesthesia Plan: General   Post-op Pain Management:    Induction: Intravenous  Airway Management Planned: Oral ETT  Additional Equipment:   Intra-op Plan:   Post-operative Plan: Extubation in OR  Informed Consent: I have reviewed the patients History and Physical, chart, labs and discussed the procedure including the risks, benefits and alternatives for the proposed anesthesia with the patient or authorized representative who has indicated his/her understanding and acceptance.   Dental advisory given  Plan Discussed with: CRNA and Surgeon  Anesthesia Plan Comments:         Anesthesia Quick Evaluation

## 2015-09-01 NOTE — Transfer of Care (Signed)
Immediate Anesthesia Transfer of Care Note  Patient: Roberto Sanchez  Procedure(s) Performed: Procedure(s): RIGHT TRICEPS  REPAIR (Right)  Patient Location: PACU  Anesthesia Type:General  Level of Consciousness: awake, sedated and patient cooperative  Airway & Oxygen Therapy: Patient Spontanous Breathing and Patient connected to face mask oxygen  Post-op Assessment: Report given to RN and Post -op Vital signs reviewed and stable  Post vital signs: Reviewed and stable  Last Vitals:  Filed Vitals:   09/01/15 1408  BP:   Pulse: 86  Temp:   Resp: 12    Complications: No apparent anesthesia complications

## 2015-09-01 NOTE — H&P (Signed)
PREOPERATIVE H&P  Chief Complaint: Right triceps rupture  HPI: Roberto Sanchez is a 69 y.o. male who presents for preoperative history and physical with a diagnosis of right triceps rupture. Symptoms are rated as moderate to severe, and have been worsening.  This is significantly impairing activities of daily living.  He has elected for surgical management. He is an avid weight lifter, and was lifting weights on 08/15/2015 and felt a pop with acute onset severe pain over his triceps. He is unable to push since that time. He's had x-rays that demonstrated a displaced avulsion fracture off of the olecranon.  Past Medical History  Diagnosis Date  . Hypertension   . Asthma   . Prostate CA ([place])   . Anxiety   . Arthritis   . GERD (gastroesophageal reflux disease)    Past Surgical History  Procedure Laterality Date  . Prostate surgery    . Nasal sinus surgery    . Knee arthroscopy Right 1980s  . Tonsillectomy  age 53   Social History   Social History  . Marital Status: Married    Spouse Name: N/A  . Number of Children: N/A  . Years of Education: N/A   Social History Main Topics  . Smoking status: Former Smoker -- 3.00 packs/day for 20 years    Types: Cigarettes    Quit date: 08/30/1983  . Smokeless tobacco: Former Systems developer    Types: [place] date: 08/29/1985  . Alcohol Use: Yes     Comment:  2-3 beers daily  . Drug Use: No  . Sexual Activity: Not Asked   Other Topics Concern  . None   Social History Narrative   History reviewed. No pertinent family history. Allergies  Allergen Reactions  . Penicillins Other (See Comments)    Causes patient to "pass out"  . Shellfish Allergy Swelling    Facial Swelling    Prior to Admission medications   Medication Sig Start Date End Date Taking? Authorizing Provider  ALPRAZolam Duanne Moron) 0.5 MG tablet Take 0.5 mg by mouth 2 (two) times daily as needed. Nerves/sleep   Yes Historical Provider, MD  Azelastine-Fluticasone (DYMISTA NA)  Place 1 spray into the nose 2 (two) times daily.   Yes Historical Provider, MD  Fluticasone-Salmeterol (ADVAIR) 250-50 MCG/DOSE AEPB Inhale 1 puff into the lungs every 12 (twelve) hours.   Yes Historical Provider, MD  losartan-hydrochlorothiazide (HYZAAR) 100-12.5 MG per tablet TK 1 T PO QD IN THE MORNING FOR BLOOD PRESSURE 07/17/15  Yes Historical Provider, MD  montelukast (SINGULAIR) 10 MG tablet Take 10 mg by mouth daily. 03/18/15  Yes Historical Provider, MD  Multiple Vitamin (MULTIVITAMIN WITH MINERALS) TABS tablet Take 1 tablet by mouth daily.   Yes Historical Provider, MD  naproxen sodium (ANAPROX) 220 MG tablet Take 220 mg by mouth 2 (two) times daily with a meal.   Yes Historical Provider, MD  omeprazole (PRILOSEC) 20 MG capsule Take 20 mg by mouth daily. 01/27/15  Yes Historical Provider, MD  azelastine (ASTELIN) 137 MCG/SPRAY nasal spray Place 1 spray into the nose 2 (two) times daily. Use in each nostril as directed    Historical Provider, MD  hydroxypropyl methylcellulose / hypromellose (ISOPTO TEARS / GONIOVISC) 2.5 % ophthalmic solution Place 1 drop into both eyes as needed for dry eyes.    Historical Provider, MD  ibuprofen (ADVIL,MOTRIN) 200 MG tablet Take 400 mg by mouth every 6 (six) hours as needed for mild pain.    Historical Provider, MD  losartan-hydrochlorothiazide (HYZAAR) 50-12.5 MG per tablet Take 1 tablet by mouth daily. 03/09/15   Historical Provider, MD  meclizine (ANTIVERT) 12.5 MG tablet Take 1 tablet (12.5 mg total) by mouth 2 (two) times daily as needed for dizziness. 03/20/15   Radene Gunning, NP     Positive ROS: All other systems have been reviewed and were otherwise negative with the exception of those mentioned in the HPI and as above.  Physical Exam: General: Alert, no acute distress Cardiovascular: No pedal edema Respiratory: No cyanosis, no use of accessory musculature GI: No organomegaly, abdomen is soft and non-tender Skin: No lesions in the area of chief  complaint Neurologic: Sensation intact distally Psychiatric: Patient is competent for consent with normal mood and affect Lymphatic: No axillary or cervical lymphadenopathy  MUSCULOSKELETAL: Right elbow has significant weakness with resistance to extension. Positive pain to palpation over the triceps distally, as well as a palpable bony fragment in the subcutaneous tissue.  Assessment: Right triceps tendon rupture   Plan: Plan for Procedure(s): RIGHT TRICEPS  REPAIR  The risks benefits and alternatives were discussed with the patient including but not limited to the risks of nonoperative treatment, versus surgical intervention including infection, bleeding, nerve injury,  blood clots, cardiopulmonary complications, morbidity, mortality, among others, and they were willing to proceed. We have also discussed the risks for recurrent weakness, incomplete relief of symptoms, among others. 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  Johnny Bridge, MD Cell [phone]   09/01/2015 9:08 AM

## 2015-09-01 NOTE — Discharge Instructions (Signed)
Diet: As you were doing prior to hospitalization   Shower:  May shower but keep the wounds dry, use an occlusive plastic wrap, NO SOAKING IN TUB.  If the bandage gets wet, change with a clean dry gauze.  If you have a splint on, leave the splint in place and keep the splint dry with a plastic bag.  Dressing:  You may change your dressing 3-5 days after surgery, unless you have a splint.  If you have a splint, then just leave the splint in place and we will change your bandages during your first follow-up appointment.    If you had hand or foot surgery, we will plan to remove your stitches in about 2 weeks in the office.  For all other surgeries, there are sticky tapes (steri-strips) on your wounds and all the stitches are absorbable.  Leave the steri-strips in place when changing your dressings, they will peel off with time, usually 2-3 weeks.  Activity:  Increase activity slowly as tolerated, but follow the weight bearing instructions below.  The rules on driving is that you can not be taking narcotics while you drive, and you must feel in control of the vehicle.    Weight Bearing:   No movement with right elbow.    To prevent constipation: you may use a stool softener such as -  Colace (over the counter) 100 mg by mouth twice a day  Drink plenty of fluids (prune juice may be helpful) and high fiber foods Miralax (over the counter) for constipation as needed.    Itching:  If you experience itching with your medications, try taking only a single pain pill, or even half a pain pill at a time.  You may take up to 10 pain pills per day, and you can also use benadryl over the counter for itching or also to help with sleep.   Precautions:  If you experience chest pain or shortness of breath - call 911 immediately for transfer to the hospital emergency department!!  If you develop a fever greater that 101 F, purulent drainage from wound, increased redness or drainage from wound, or calf pain -- Call  the office at [phone]                                                Follow- Up Appointment:  Please call for an appointment to be seen in 2 weeks [place] - [phone]    Post Anesthesia Home Care Instructions  Activity: Get plenty of rest for the remainder of the day. A responsible adult should stay with you for 24 hours following the procedure.  For the next 24 hours, DO NOT: -Drive a car -Paediatric nurse -Drink alcoholic beverages -Take any medication unless instructed by your physician -Make any legal decisions or sign important papers.  Meals: Start with liquid foods such as gelatin or soup. Progress to regular foods as tolerated. Avoid greasy, spicy, heavy foods. If nausea and/or vomiting occur, drink only clear liquids until the nausea and/or vomiting subsides. Call your physician if vomiting continues.  Special Instructions/Symptoms: Your throat may feel dry or sore from the anesthesia or the breathing tube placed in your throat during surgery. If this causes discomfort, gargle with warm salt water. The discomfort should disappear within 24 hours.  If you had a scopolamine patch placed behind your ear for the  management of post- operative nausea and/or vomiting:  1. The medication in the patch is effective for 72 hours, after which it should be removed.  Wrap patch in a tissue and discard in the trash. Wash hands thoroughly with soap and water. 2. You may remove the patch earlier than 72 hours if you experience unpleasant side effects which may include dry mouth, dizziness or visual disturbances. 3. Avoid touching the patch. Wash your hands with soap and water after contact with the patch.

## 2015-09-01 NOTE — Anesthesia Procedure Notes (Signed)
Procedure Name: Intubation Date/Time: 09/01/2015 11:51 AM Performed by: Lyndee Leo Pre-anesthesia Checklist: Patient identified, Emergency Drugs available, Suction available and Patient being monitored Patient Re-evaluated:Patient Re-evaluated prior to inductionOxygen Delivery Method: Circle System Utilized Preoxygenation: Pre-oxygenation with 100% oxygen Intubation Type: IV induction Ventilation: Mask ventilation without difficulty Laryngoscope Size: Mac, 4 and Glidescope Grade View: Grade IV Tube type: Oral Number of attempts: 2 Airway Equipment and Method: Stylet,  Oral airway,  Patient positioned with wedge pillow,  Rigid stylet and Video-laryngoscopy Placement Confirmation: ETT inserted through vocal cords under direct vision,  positive ETCO2 and breath sounds checked- equal and bilateral Secured at: 22 cm Tube secured with: Tape Dental Injury: Teeth and Oropharynx as per pre-operative assessment  Difficulty Due To: Difficulty was anticipated, Difficult Airway- due to anterior larynx and Difficult Airway- due to limited oral opening Future Recommendations: Recommend- induction with short-acting agent, and alternative techniques readily available

## 2015-09-01 NOTE — Anesthesia Postprocedure Evaluation (Signed)
  Anesthesia Post-op Note  Patient: Roberto Sanchez  Procedure(s) Performed: Procedure(s) (LRB): RIGHT TRICEPS  REPAIR (Right)  Patient Location: PACU  Anesthesia Type: General  Level of Consciousness: awake and alert   Airway and Oxygen Therapy: Patient Spontanous Breathing  Post-op Pain: mild  Post-op Assessment: Post-op Vital signs reviewed, Patient's Cardiovascular Status Stable, Respiratory Function Stable, Patent Airway and No signs of Nausea or vomiting  Last Vitals:  Filed Vitals:   09/01/15 1415  BP: 160/79  Pulse: 80  Temp:   Resp: 15    Post-op Vital Signs: stable   Complications: No apparent anesthesia complications

## 2015-09-01 NOTE — Op Note (Signed)
09/01/2015  1:33 PM  PATIENT:  Roberto Sanchez    PRE-OPERATIVE DIAGNOSIS:  Right triceps tendon rupture   POST-OPERATIVE DIAGNOSIS:  Same  PROCEDURE:  RIGHT TRICEPS  REPAIR  SURGEON:  Johnny Bridge, MD  PHYSICIAN ASSISTANT: Joya Gaskins, OPA-C, present and scrubbed throughout the case, critical for completion in a timely fashion, and for retraction, instrumentation, and closure.  ANESTHESIA:   General  PREOPERATIVE INDICATIONS:  [name] is a  69 y.o. male who complained of acute weakness in the right triceps after injuring it while weight lifting. He elected for surgical management.  The risks benefits and alternatives were discussed with the patient preoperatively including but not limited to the risks of infection, bleeding, nerve injury, cardiopulmonary complications, the need for revision surgery, among others, and the patient was willing to proceed. We also discussed the risks for recurrent triceps rupture, persistent weakness, incomplete relief of symptoms, among others.  OPERATIVE IMPLANTS: #2 FiberWire 2 were woven through the triceps and then brought through drill holes in the proximal olecranon, and then backed up distally with a 3.5 mm push lock.  OPERATIVE FINDINGS: There was complete rupture of the triceps tendon, with significant traction osteophyte formation. The lateral head of the triceps appeared to be still attached, and the deep triceps was also still attached, but the medial head was completely ruptured.  OPERATIVE PROCEDURE: The patient is brought to the operating room and placed in supine position. Gen. anesthesia was administered. IV antibiotics were given. The right upper extremity was positioned, he was in a lateral decubitus position with all bony prominences padded. The upper extremity was prepped and draped in usual sterile fashion and timeout performed and the arm was elevated and exsanguinated and a tourniquet was inflated. Tourniquet time was  approximately 80 minutes.   Posterior incision was carried out and a substantial fluid accumulation was released. Dissection was carried down and the bone fragment identified, and initially I had anticipated placing the sutures through the bone fragment because it was fairly substantial, and I actually used a drill and passed a total of 2 #2 FiberWire through the bone fragment and then wove it up and down the tendon. I placed a drill hole in the center of the bed where the traction osteophyte had been avulsed from, and then brought the sutures out through drill holes, one medial and one lateral on the proximal ulna.  I then secured the bone fragment and tendon down into the bed, and tied the FiberWire. I range the elbow, but the relationship of the bone fragment to the bed was not satisfactory, and in fact in deeper ranges of flexion the bone fragment came off of the bed and the FiberWire effectively loosened.  Therefore I removed the knots from the FiberWire, placed 0 Vicryl through the drill holes as passing suture, and then brought the FiberWire out of the bone piece on the triceps and excised the bone piece in order to get a better tendon to bone opposition.  I then secured the tendon to bone again bypassing the FiberWire through the bone using the passing sutures that I previously left, and then secured these down and had excellent apposition of the tendon to the bone.  I then used a 3.5 mm push lock placed distally at an angle in order to try and minimize the risk for rerupture, and the angle was necessitated by the fact that I needed the length because the push lock measured 19 mm. I drilled with a  appropriate drill bit, and then secured the push lock in place. Satisfactory overall fixation was achieved, and I used the C-arm to make sure that there was no fracture or hardware complication with the drilling of the push lock.  He did have substantial traction osteophyte on the olecranon, which I  effectively used as the bed for repair.  I would be very cautious with moving his elbow, as certainly the triceps is under substantial tension as he gets into the deeper ranges of flexion, and I will likely have him 0-30 at most during the first 4-6 weeks of healing.  The wounds were irrigated copiously, injected, and repaired with Vicryl followed by Steri-Strips and sterile gauze. He was awakened and returned to the PACU in stable and satisfactory condition. He was splinted in full extension. He tolerated the procedure well and there were no complications.

## 2015-09-04 ENCOUNTER — Encounter (HOSPITAL_BASED_OUTPATIENT_CLINIC_OR_DEPARTMENT_OTHER): Payer: Self-pay | Admitting: Orthopedic Surgery

## 2016-05-07 ENCOUNTER — Ambulatory Visit (INDEPENDENT_AMBULATORY_CARE_PROVIDER_SITE_OTHER): Payer: Medicare Other | Admitting: Allergy and Immunology

## 2016-05-07 ENCOUNTER — Encounter: Payer: Self-pay | Admitting: Allergy and Immunology

## 2016-05-07 VITALS — BP 120/70 | HR 71 | Temp 97.7°F | Resp 16 | Ht 69.69 in | Wt 210.2 lb

## 2016-05-07 DIAGNOSIS — J4540 Moderate persistent asthma, uncomplicated: Secondary | ICD-10-CM | POA: Diagnosis not present

## 2016-05-07 DIAGNOSIS — H1010 Acute atopic conjunctivitis, unspecified eye: Secondary | ICD-10-CM | POA: Diagnosis not present

## 2016-05-07 DIAGNOSIS — J309 Allergic rhinitis, unspecified: Secondary | ICD-10-CM

## 2016-05-07 MED ORDER — OLOPATADINE HCL 0.6 % NA SOLN: NASAL | Status: DC

## 2016-05-07 NOTE — Progress Notes (Signed)
NEW PATIENT NOTE  RE: Roberto Sanchez. MRN: [id number] DOB: June 26, 1946 [facility]. [address] [place], [place] [address] Date of Office Visit: 05/07/2016  Dear Shanon Brow B. Scotty Court, MD:  I had the pleasure of seeing Roberto Sanchez  today in initial evaluation, as you recall-- Subjective:  Roberto Tsung. is a 70 y.o. male who presents today for Headache; Cough; and Nasal Congestion  Assessment:   1. Moderate persistent asthma, well controlled.  2. Allergic rhinoconjunctivitis,  perennial and seasonal hypersensitivities ( 2012).   3.      History of shellfish allergy--- avoidance. 4.      Complex medical history.   Plan:   Meds ordered this encounter  Medications  . Olopatadine HCl 0.6 % SOLN    Sig: Two sprays into each nostril twice a day.    Dispense:  1 Bottle    Refill:  5  1. Avoidance: Mite, Mold and Pollen and shellfish. 2. Antihistamine: Xyzal 5mg   by mouth once daily for runny nose or itching. 3. Nasal Spray: Nasacort 2 spray(s) each nostril once daily (each morning) for stuffy nose or drainage. And use Patanase 2 sprays each nostril each evening. 4. Inhalers:  Rescue: ProAir 2 puffs every 4 hours as needed for cough or wheeze.       -May use 2 puffs 10-20 minutes prior to exercise.  Preventative: Advair 248mcg one puff twice daily (Rinse, gargle, and spit out after use). 5. Eye Drops: Zaditor one drop(s) each eye twice daily for itchy eyes as needed. 6. Other:  Continue Singulair 10mg  each evening.        Epi-pen/benadryl as needed.        Consider sinus CT scan with any recurring symptoms.        Reconsider allergen immunotherapy. 7. Nasal Saline wash each evening at shower and prior to medicated sprays. 8. Follow up Visit: 1 month or sooner if needed.  HPI: Roberto Sanchez presents to the office with history of allergic rhinoconjunctivitis, asthma and shellfish allergy with concern for persisting nasal symptoms despite daily medication  management. He was evaluated through our office last in 2013 with positive aeroallergen skin prick testing in 2012.  He reports immunotherapy as a teenager and 20 years ago, though unsure of benefit.  Describes rhinorrhea, congestion, sneezing, itchy watery eyes intermittently associated with cough, postnasal drip and headache/sinus pressure. Three times a year, he is receiving antibiotics and intermittent courses of prednisone for nasal symptoms.   He tends to describe pollen, dust, outdoors, fluctuant weather patterns, strong odor and perfumes as provoking factors for his symptoms. He feels his asthma is under good control without difficulty breathing, shortness of breath or any recurring albuterol use.   He exercises regularly, going to the gym several times a week. Denies ED or Urgent care visits, nocturnal symptoms, disruption of usual activities or other concerns.  Medical History: Past Medical History  Diagnosis Date  . Hypertension   . Asthma   . Prostate CA ([place])   . Anxiety   . Arthritis   . GERD (gastroesophageal reflux disease)   . Rupture of right triceps tendon 09/01/2015   Surgical History: Past Surgical History  Procedure Laterality Date  . Prostate surgery    . Nasal sinus surgery    . Knee arthroscopy Right 1980s  . Tonsillectomy  age 16  . Triceps tendon repair Right 09/01/2015    Procedure: RIGHT TRICEPS  REPAIR;  Surgeon: Marchia Bond, MD;  Location: [facility];  Service: Orthopedics;  Laterality: Right;   Family History: Family History  Problem Relation Age of Onset  . Asthma Maternal Uncle   . Angioedema Neg Hx   . Eczema Neg Hx   . Urticaria Neg Hx   . Immunodeficiency Neg Hx   . Allergic rhinitis Brother   . Allergic rhinitis Brother    Social History: Social History  . Marital Status: Married    Spouse Name: N/A  . Number of Children: N/A  . Years of Education: N/A   Social History Main Topics  . Smoking status: Former Smoker -- 3.00  packs/day for 20 years    Types: Cigarettes    Quit date: 08/30/1983  . Smokeless tobacco: Former Systems developer    Types: [place] date: 08/29/1985  . Alcohol Use: Yes     Comment:  2-3 beers daily  . Drug Use: No  . 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  Mya has a current medication list which includes the following prescription(s): albuterol, alprazolam, azelastine-fluticasone, fluticasone-salmeterol, losartan-hydrochlorothiazide, montelukast, multivitamin with minerals, omeprazole, sennosides-docusate sodium, sildenafil.  Drug Allergies: Allergies  Allergen Reactions  . Penicillins Other (See Comments)    Causes patient to "pass out"  . Shellfish Allergy Swelling    Facial Swelling    Environmental History: Roberto Sanchez lives in a 70 year old house for 18 years with carpet/wood floors, with central heat and air; stuffed mattress, non-feather pillow/comforter, indoor dog without humidifier or smokers.   Review of Systems  Constitutional: Negative for fever, weight loss and malaise/fatigue.  HENT: Positive for congestion. Negative for ear pain, hearing loss, nosebleeds and sore throat.   Eyes: Negative for discharge and redness.  Respiratory: Negative for shortness of breath.        Bronchitis and pneumonia years ago.  Gastrointestinal: Negative for heartburn, nausea, vomiting, abdominal pain, diarrhea and constipation.  Genitourinary: Negative.   Musculoskeletal: Negative for myalgias and joint pain.  Skin: Negative.  Negative for itching and rash.  Neurological: Negative.  Negative for dizziness, seizures, weakness and headaches.  Endo/Heme/Allergies: Positive for environmental allergies.       Denies sensitivity to aspirin, NSAIDs, stinging insects, latex, jewelry.  Immunological: No chronic or recurring infections. Objective:   Filed Vitals:   05/07/16 0850  BP: 120/70  Pulse: 71  Temp: 97.7 F (36.5 C)  Resp:  16   SpO2 Readings from Last 1 Encounters:  05/07/16 95%   Physical Exam  Constitutional: He is well-developed, well-nourished, and in no distress.  HENT:  Head: Atraumatic.  Right Ear: Tympanic membrane and ear canal normal.  Left Ear: Tympanic membrane and ear canal normal.  Nose: Mucosal edema present. No rhinorrhea. No epistaxis.  Mouth/Throat: Oropharynx is clear and moist and mucous membranes are normal. No oropharyngeal exudate, posterior oropharyngeal edema or posterior oropharyngeal erythema.  Eyes: Conjunctivae are normal.  Neck: Neck supple.  Cardiovascular: Normal rate, S1 normal and S2 normal.   No murmur heard. Pulmonary/Chest: Effort normal and breath sounds normal. He has no wheezes. He has no rhonchi. He has no rales.  Abdominal: Soft. Bowel sounds are normal.  Lymphadenopathy:    He has no cervical adenopathy.  Neurological: He is alert.  Skin: Skin is warm and intact. No rash noted. No cyanosis. Nails show no clubbing.   Diagnostics: Spirometry:  FVC 3.30--82%, FEV1 2.60--84%.   (See scanned image).  Rebbeca Sheperd M. Ishmael Holter, MD   cc: Deloria Lair, MD

## 2016-05-07 NOTE — Patient Instructions (Signed)
Take Home Sheet  1. Avoidance: Mite, Mold and Pollen and shellfish.   2. Antihistamine: Xyzal 5mg   by mouth once daily for runny nose or itching.   3. Nasal Spray: Nasacort 2 spray(s) each nostril once daily (each morning) for stuffy nose or drainage.   And use Patanase 2 sprays each nostril each evening.    4. Inhalers:  Rescue: ProAir 2 puffs every 4 hours as needed for cough or wheeze.       -May use 2 puffs 10-20 minutes prior to exercise.   Preventative: Advair 250mcg one puffs twice daily (Rinse, gargle, and spit out after use).   5. Eye Drops: Zaditor one drop(s) each eye twice daily for itchy eyes as needed.    6. Other:  Continue  Singulair 10mg  each evening.        Epi-pen/benadryl as needed.        Consider sinus CT scan with any recurring symptoms.        Reconsider allergen immunotherapy.  7. Nasal Saline wash each evening at shower and prior to medicated sprays.   8. Follow up Visit: 1 month or sooner if needed.   Websites that have reliable Patient information: 1. American Academy of Asthma, Allergy, & Immunology: www.aaaai.org 2. Food Allergy Network: www.foodallergy.org 3. Mothers of Asthmatics: www.aanma.org 4. [place]: DiningCalendar.de 5. American College of Allergy, Asthma, & Immunology: https://robertson.info/ or www.acaai.org

## 2016-05-09 ENCOUNTER — Encounter: Payer: Self-pay | Admitting: Allergy and Immunology

## 2016-06-11 ENCOUNTER — Ambulatory Visit: Payer: Medicare Other | Admitting: Allergy and Immunology

## 2016-08-14 IMAGING — DX DG HUMERUS 2V *R*
4 series · 4 of 4 positions shown · non-contrast
Comparison: None.

CLINICAL DATA: Pain rt tricep area after working out on the tricep
machine this morning. Pain if he tries to raise arm.

EXAM:
RIGHT HUMERUS - 2+ VIEW

[humerus ap (1 of 2)]
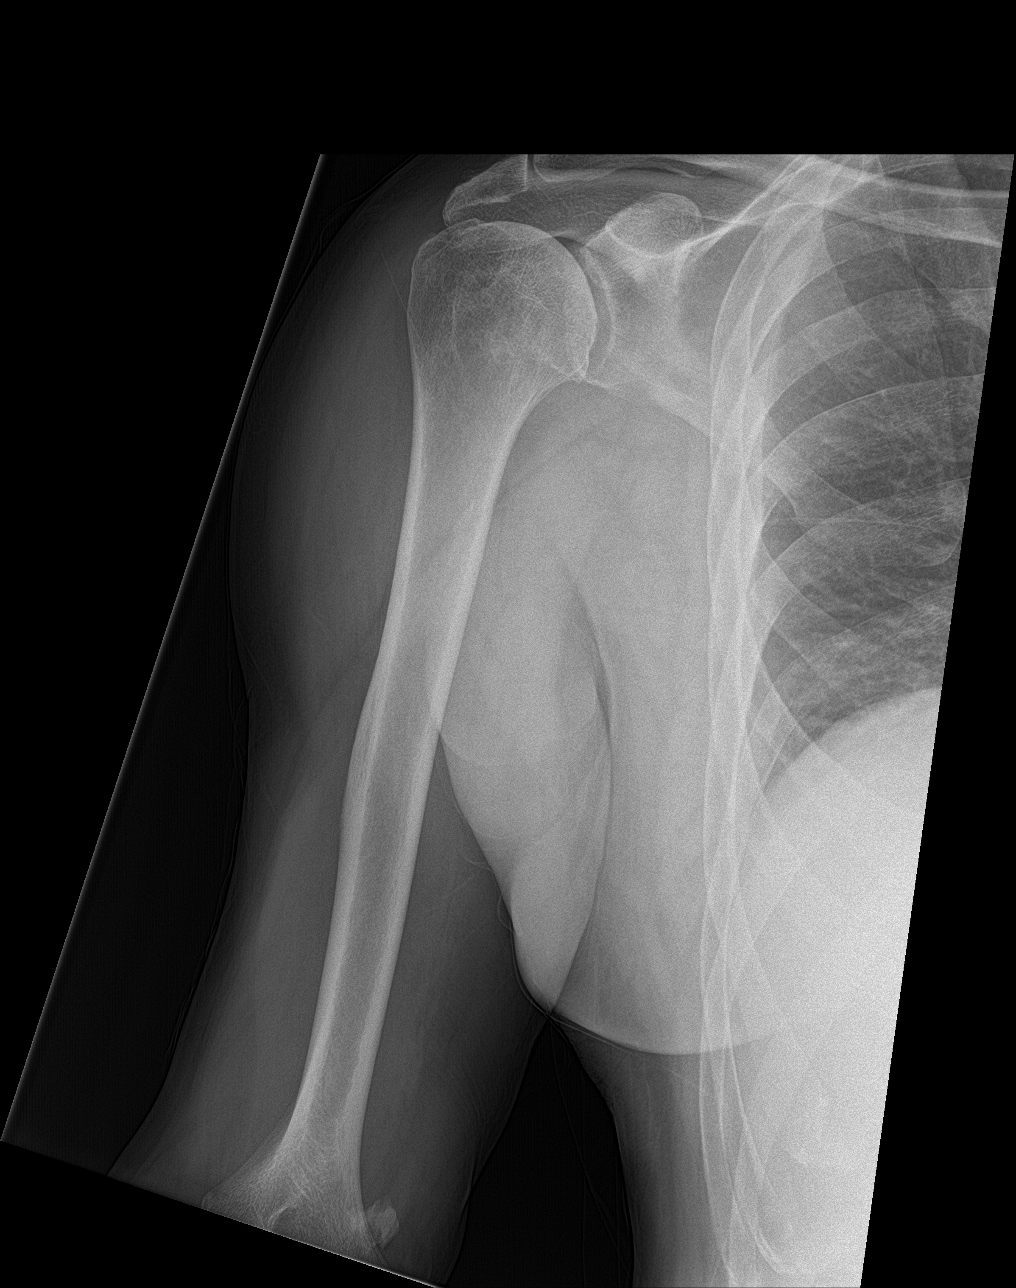

[humerus lat (1 of 2)]
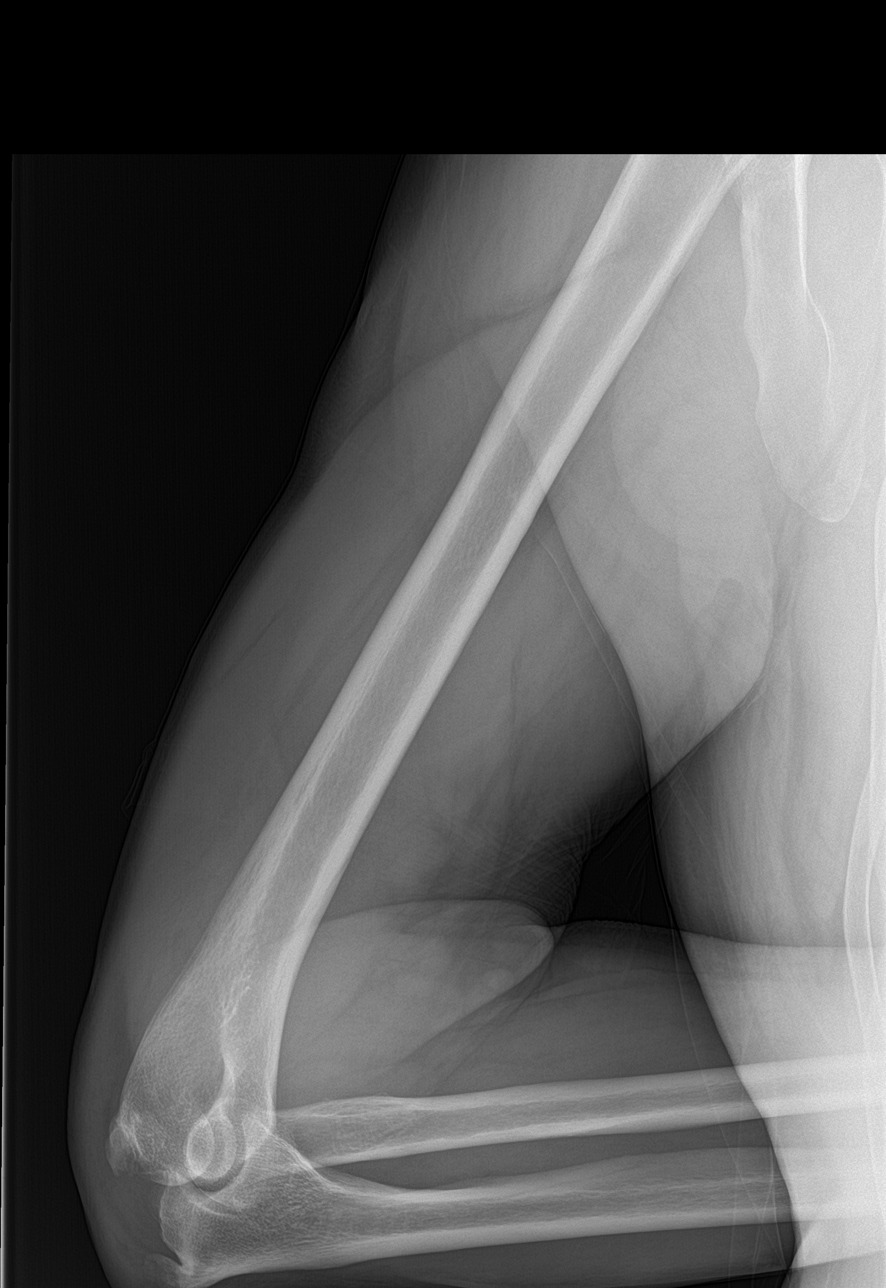

[humerus ap (2 of 2)]
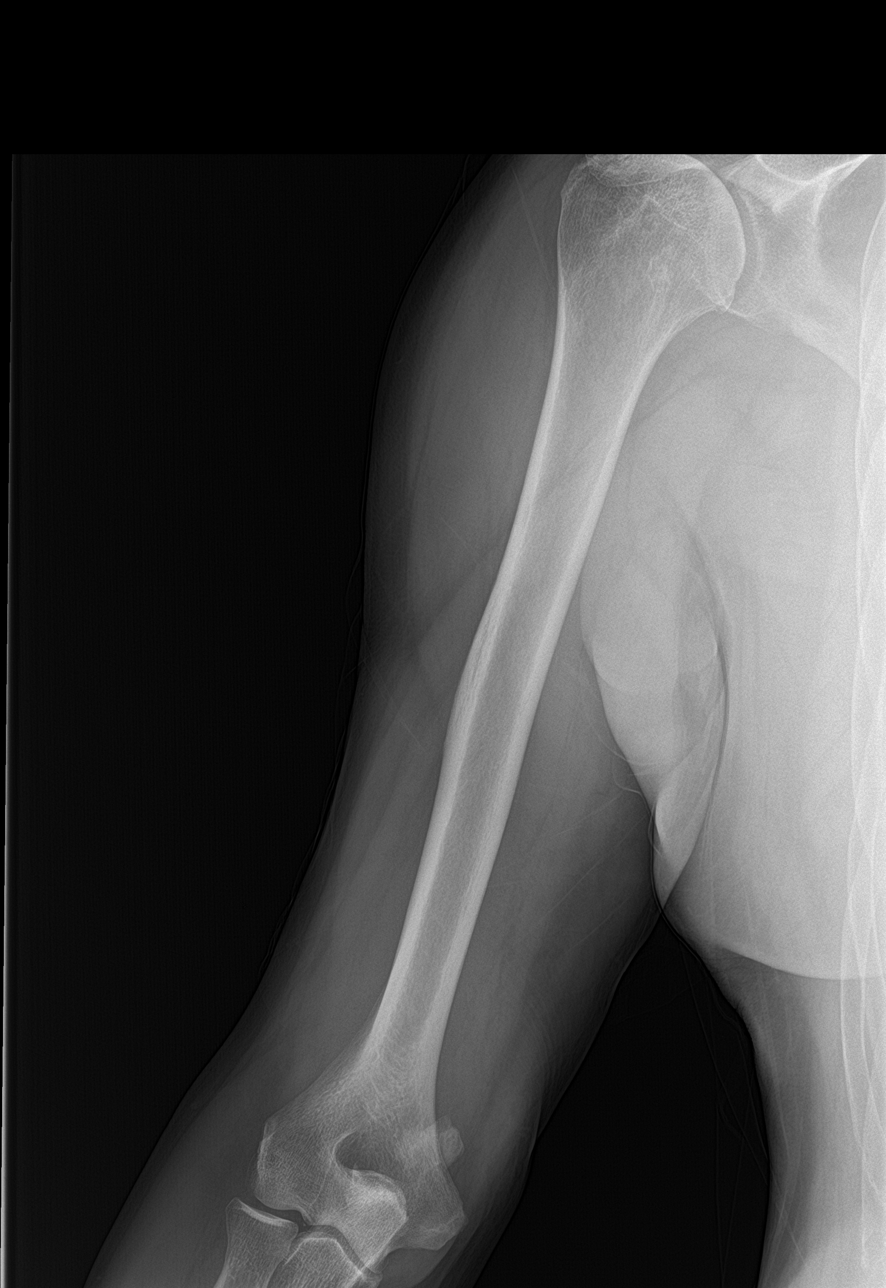

[humerus lat (2 of 2)]
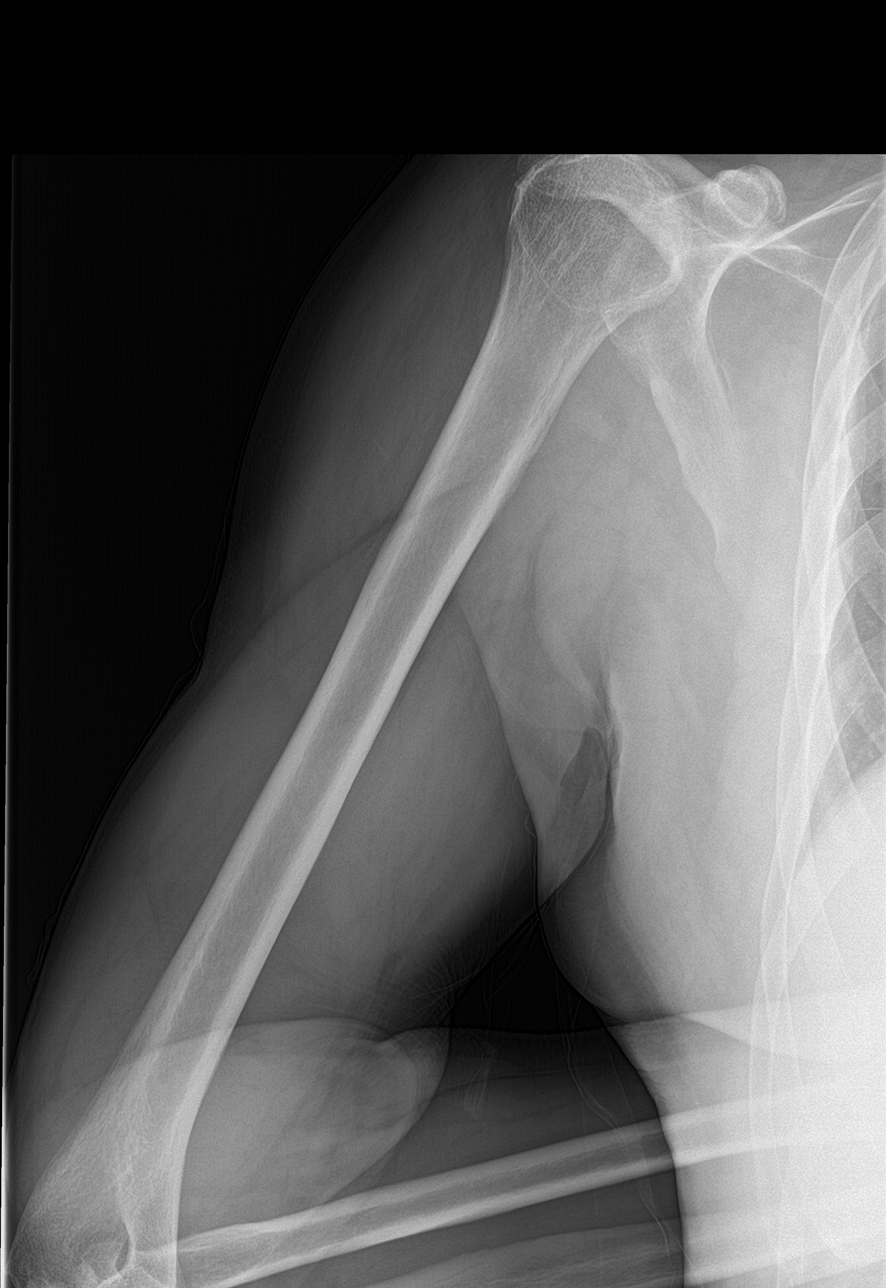

[4 of 4 positions shown; findings below may reference images not displayed]

FINDINGS: There is a fracture of the olecranon, along its proximal aspect, but
anterior to an enthesophyte at the triceps insertion. This would be
better evaluated on elbow radiographs.

No other evidence of a fracture. The elbow and shoulder joints are
normally aligned. There are mild arthropathic changes of the right
glenohumeral joint with inferior marginal spurring.

Soft tissues are unremarkable.
IMPRESSION: Fracture of the olecranon likely involving at least a portion of the
triceps muscle or tendon insertion. Recommend followup probable
radiographs. No other acute finding.

## 2017-10-22 ENCOUNTER — Other Ambulatory Visit: Payer: Self-pay

## 2017-10-22 ENCOUNTER — Emergency Department (HOSPITAL_COMMUNITY)
Admission: EM | Admit: 2017-10-22 | Discharge: 2017-10-22 | Disposition: A | Discharge status: Home / Self Care | Payer: Medicare Other | Attending: Emergency Medicine | Admitting: Emergency Medicine

## 2017-10-22 ENCOUNTER — Encounter (HOSPITAL_COMMUNITY): Payer: Self-pay | Admitting: *Deleted

## 2017-10-22 DIAGNOSIS — Z87891 Personal history of nicotine dependence: Secondary | ICD-10-CM | POA: Insufficient documentation

## 2017-10-22 DIAGNOSIS — Z23 Encounter for immunization: Secondary | ICD-10-CM | POA: Insufficient documentation

## 2017-10-22 DIAGNOSIS — I10 Essential (primary) hypertension: Secondary | ICD-10-CM | POA: Insufficient documentation

## 2017-10-22 DIAGNOSIS — Y939 Activity, unspecified: Secondary | ICD-10-CM | POA: Diagnosis not present

## 2017-10-22 DIAGNOSIS — Z7982 Long term (current) use of aspirin: Secondary | ICD-10-CM | POA: Diagnosis not present

## 2017-10-22 DIAGNOSIS — Y998 Other external cause status: Secondary | ICD-10-CM | POA: Diagnosis not present

## 2017-10-22 DIAGNOSIS — J45909 Unspecified asthma, uncomplicated: Secondary | ICD-10-CM | POA: Diagnosis not present

## 2017-10-22 DIAGNOSIS — Z8546 Personal history of malignant neoplasm of prostate: Secondary | ICD-10-CM | POA: Diagnosis not present

## 2017-10-22 DIAGNOSIS — W19XXXA Unspecified fall, initial encounter: Secondary | ICD-10-CM | POA: Diagnosis not present

## 2017-10-22 DIAGNOSIS — Z79899 Other long term (current) drug therapy: Secondary | ICD-10-CM | POA: Insufficient documentation

## 2017-10-22 DIAGNOSIS — Y929 Unspecified place or not applicable: Secondary | ICD-10-CM | POA: Insufficient documentation

## 2017-10-22 DIAGNOSIS — S50812A Abrasion of left forearm, initial encounter: Secondary | ICD-10-CM | POA: Diagnosis present

## 2017-10-22 MED ORDER — TETANUS-DIPHTH-ACELL PERTUSSIS 5-2.5-18.5 LF-MCG/0.5 IM SUSP
0.5000 mL | Freq: Once | INTRAMUSCULAR | Status: AC
  Administered 2017-10-22: 0.5 mL via INTRAMUSCULAR
  Filled 2017-10-22: qty 0.5

## 2017-10-22 MED ORDER — DOXYCYCLINE HYCLATE 100 MG PO CAPS: 100.0000 mg | ORAL_CAPSULE | Freq: Two times a day (BID) | ORAL | 0 refills | Status: AC

## 2017-10-22 NOTE — ED Provider Notes (Signed)
[facility] EMERGENCY DEPARTMENT Provider Note   CSN: [id number] Arrival date & time: 10/22/17  1514     History   Chief Complaint Chief Complaint  Patient presents with  . Arm Swelling    HPI Roberto Sanchez. is a 71 y.o. male.  HPI   Patient is a 71 year old male with a history of hypertension, arthritis, asthma, and rupture of right biceps tendon presenting for swelling of the left forearm.  Patient reports that he had a fall approximately 1.5 weeks ago where he sustained an abrasion to the left forearm.  Patient noted that there was significant swelling around this abrasion at that time which has subsequently reduced, however he still has an area of swelling that is not resolving.  Patient denies any distal paresthesias or weakness to the left upper extremity.  Patient denies any surrounding erythema to this abrasion or purulent drainage.  No head trauma occurred in this fall.  Patient is not diabetic.  Tetanus status is unknown. Patient is on fish oil and 81 mg ASA daily.  Past Medical History:  Diagnosis Date  . Anxiety   . Arthritis   . Asthma   . GERD (gastroesophageal reflux disease)   . Hypertension   . Prostate CA ([place])   . Rupture of right triceps tendon 09/01/2015    Patient Active Problem List   Diagnosis Date Noted  . Rupture of right triceps tendon 09/01/2015  . Anxiety 03/20/2015  . Asthma, chronic   . Vertigo 03/19/2015  . Essential hypertension 03/19/2015  . Adenocarcinoma of prostate ([place]) 10/04/2013  . Malignant neoplasm of prostate ([place]) 02/13/2012  . 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 Genuine stress incontinence, male 02/13/2012    Past Surgical History:  Procedure Laterality Date  . KNEE ARTHROSCOPY Right 1980s  . NASAL SINUS SURGERY    . PROSTATE SURGERY    . TONSILLECTOMY  age 34  . TRICEPS TENDON REPAIR Right 09/01/2015   Procedure: RIGHT TRICEPS  REPAIR;  Surgeon: Marchia Bond, MD;  Location: [facility];  Service: Orthopedics;  Laterality: Right;       Home Medications    Prior to Admission medications   Medication Sig Start Date End Date Taking? Authorizing Provider  albuterol (PROAIR HFA) 108 (90 Base) MCG/ACT inhaler Inhale into the lungs. 03/16/08   [provider]  ALPRAZolam Duanne Moron) 0.5 MG tablet Take 0.5 mg by mouth 2 (two) times daily as needed. Nerves/sleep    [provider]  azelastine (ASTELIN) 137 MCG/SPRAY nasal spray Place 1 spray into the nose 2 (two) times daily. Reported on 05/07/2016    [provider]  Azelastine-Fluticasone (DYMISTA NA) Place 1 spray into the nose 2 (two) times daily.    [provider]  baclofen (LIORESAL) 10 MG tablet Take 1 tablet (10 mg total) by mouth 3 (three) times daily. As needed for muscle spasm Patient not taking: Reported on 05/07/2016 09/01/15   Marchia Bond, MD  doxycycline (VIBRAMYCIN) 100 MG capsule Take 1 capsule (100 mg total) by mouth 2 (two) times daily for 7 days. 10/22/17 10/29/17  Langston Masker B, PA-C  Fluticasone-Salmeterol (ADVAIR) 250-50 MCG/DOSE AEPB Inhale 1 puff into the lungs every 12 (twelve) hours.    [provider]  hydroxypropyl methylcellulose / hypromellose (ISOPTO TEARS / GONIOVISC) 2.5 % ophthalmic solution Place 1 drop into both eyes as needed for dry eyes. Reported on 05/07/2016    [provider]  losartan-hydrochlorothiazide (HYZAAR) 100-12.5 MG  per tablet TK 1 T PO QD IN THE MORNING FOR BLOOD PRESSURE 07/17/15   [provider]  meclizine (ANTIVERT) 12.5 MG tablet Take 1 tablet (12.5 mg total) by mouth 2 (two) times daily as needed for dizziness. Patient not taking: Reported on 05/07/2016 03/20/15   Radene Gunning, NP  montelukast (SINGULAIR) 10 MG tablet Take 10 mg by mouth daily. 03/18/15   [provider]  Multiple Vitamin (MULTIVITAMIN WITH MINERALS) TABS tablet Take 1 tablet by mouth daily.    [provider]  Olopatadine HCl  0.6 % SOLN Two sprays into each nostril twice a day. 05/07/16   Gean Quint, MD  omeprazole (PRILOSEC) 20 MG capsule Take 20 mg by mouth daily. 01/27/15   [provider]  ondansetron (ZOFRAN) 4 MG tablet Take 1 tablet (4 mg total) by mouth every 8 (eight) hours as needed for nausea or vomiting. Patient not taking: Reported on 05/07/2016 09/01/15   Marchia Bond, MD  oxyCODONE-acetaminophen (ROXICET) 5-325 MG tablet Take 1-2 tablets by mouth every 6 (six) hours as needed for severe pain. Patient not taking: Reported on 05/07/2016 09/01/15   Marchia Bond, MD  sennosides-docusate sodium (SENOKOT-S) 8.6-50 MG tablet Take 2 tablets by mouth daily. 09/01/15   Marchia Bond, MD  sildenafil (VIAGRA) 100 MG tablet Take 100 mg by mouth. 08/10/14   [provider]    Family History Family History  Problem Relation Age of Onset  . Asthma Maternal Uncle   . Allergic rhinitis Brother   . Allergic rhinitis Brother   . Angioedema Neg Hx   . Eczema Neg Hx   . Urticaria Neg Hx   . Immunodeficiency Neg Hx     Social History Social History   Tobacco Use  . Smoking status: Former Smoker    Packs/day: 3.00    Years: 20.00    Pack years: 60.00    Types: Cigarettes    Last attempt to quit: 08/30/1983    Years since quitting: 34.1  . Smokeless tobacco: Former Systems developer    Types: [place] date: 08/29/1985  Substance Use Topics  . Alcohol use: Yes    Comment:  2-3 beers daily  . Drug use: No     Allergies   Penicillins and Shellfish allergy   Review of Systems Review of Systems  Constitutional: Negative for chills and fever.  Skin: Positive for color change and wound.  Neurological: Negative for weakness and numbness.     Physical Exam Updated Vital Signs BP (!) 154/79 (BP Location: Right Arm)   Pulse 82   Temp 98.1 F (36.7 C) (Oral)   Resp 18   Ht 5\' 11"  (1.803 m)   Wt 97.5 kg (215 lb)   SpO2 97%   BMI 29.99 kg/m   Physical Exam  Constitutional: He appears  well-developed and well-nourished. No distress.  Sitting comfortably in bed.  HENT:  Head: Normocephalic and atraumatic.  Eyes: Conjunctivae are normal. Right eye exhibits no discharge. Left eye exhibits no discharge.  EOMs normal to gross examination.  Neck: Normal range of motion.  Cardiovascular: Normal rate and regular rhythm.  Intact, 2+ radial and ulnar pulse of LUE  Pulmonary/Chest:  Normal respiratory effort. Patient converses comfortably. No audible wheeze or stridor.  Abdominal: He exhibits no distension.  Musculoskeletal: Normal range of motion.  Neurological: He is alert.  Cranial nerves intact to gross observation. Patient moves extremities without difficulty. Grip strength of left upper extremity 5 out of  5.  Sensation intact at distal tips of left fingers.  Skin: Skin is warm and dry. He is not diaphoretic.  There is an abrasion to the dorsal surface of the left forearm.  There is an area of fluctuance lateral to this abrasion.  There is minimal surrounding erythema.  The area is minimally tender to palpation.  There is ecchymosis distal to the abrasion.  Psychiatric: He has a normal mood and affect. His behavior is normal. Judgment and thought content normal.  Nursing note and vitals reviewed.    ED Treatments / Results  Labs (all labs ordered are listed, but only abnormal results are displayed) Labs Reviewed - No data to display  EKG  EKG Interpretation None       Radiology No results found.  Procedures Procedures (including critical care time)  EMERGENCY DEPARTMENT US SOFT TISSUE INTERPRETATION "Study: Limited Soft Tissue Ultrasound"  INDICATIONS: Soft tissue swelling Multiple views of the body part were obtained in real-time with a multi-frequency linear probe PERFORMED BY:  Myself IMAGES ARCHIVED?: No SIDE:Left BODY PART:Upper extremity FINDINGS: Other fluid collection without surrounding cobblestoning INTERPRETATION:  No cellulitis  noted   CPT: Neck 51884-16  Upper extremity 76880-26  Axilla 60630-16  Chest wall 01093-23  Beast 55732-20  Upper back 25427-06  Lower back 23762-83  Abdominal wall 15176-16  Pelvic wall 07371-06  Lower extremity 26948-54  Other soft tissue 62703-50   Medications Ordered in ED Medications  Tdap (BOOSTRIX) injection 0.5 mL (0.5 mLs Intramuscular Given 10/22/17 1640)     Initial Impression / Assessment and Plan / ED Course  I have reviewed the triage vital signs and the nursing notes.  Pertinent labs & imaging results that were available during my care of the patient were reviewed by me and considered in my medical decision making (see chart for details).      Final Clinical Impressions(s) / ED Diagnoses   Final diagnoses:  Abrasion of left forearm, initial encounter   Patient is well-appearing, afebrile, and in no acute distress.  Clinically, the fluctuant area lateral to the abrasion does not show signs of infection.  This is likely a hematoma due to thinning of skin and antiplatelet use with ASA and fish oil.  An ultrasound was performed and demonstrated a fluid collection but no surrounding signs of cobblestoning.  Patient case discussed with Dr. Fredia Sorrow.  Recommendation to treat prophylactically with doxycycline.  Ace wrap applied today and instructions to elevate and ice.  Return precautions given for any worsening erythema, purulence, fever, or chills.  Patient is in understanding and agrees with the plan of care.  Tetanus updated today.  This is a shared visit with Dr. Fredia Sorrow. Patient was independently evaluated by this attending physician. Attending physician consulted in evaluation and discharge management.  ED Discharge Orders        Ordered    doxycycline (VIBRAMYCIN) 100 MG capsule  2 times daily     10/22/17 [address] 10/22/17 1712    Fredia Sorrow, MD 10/22/17 1714

## 2017-10-22 NOTE — Discharge Instructions (Signed)
Please see the information and instructions below regarding your visit.  Your diagnoses today include:  1. Abrasion of left forearm, initial encounter    Your abrasion does not show any signs of infection today.  Please take your antibiotics as prescribed for their ENTIRE prescribed duration.   We updated your tetanus shot today.  Tests performed today include: See side panel of your discharge paperwork for testing performed today. Vital signs are listed at the bottom of these instructions.   Medications prescribed:    Take any prescribed medications only as prescribed, and any over the counter medications only as directed on the packaging.  1. Please take all of your antibiotics until finished.   You may develop abdominal discomfort or nausea from the antibiotic. If this occurs, you may take it with food. Some patients also get diarrhea with antibiotics. You may help offset this with probiotics which you can buy or get in yogurt. Do not eat or take the probiotics until 2 hours after your antibiotic.  Doxycycline can cause photosensitivity to the sun, so please use sunscreen and hats while you are on this medication.  Some people develop allergies to antibiotics. Symptoms of antibiotic allergy can be mild and include a flat rash and itching. They can also be more serious and include:  ?Hives - Hives are raised, red patches of skin that are usually very itchy.  ?Lip or tongue swelling  ?Trouble swallowing or breathing  ?Blistering of the skin or mouth.  If you have any of these serious symptoms, please seek emergency medical care immediately.  Home care instructions:  Please follow any educational materials contained in this packet.   Keep affected area above the level of your heart when possible. Wash area gently twice a day with warm soapy water. Do not apply alcohol or hydrogen peroxide. Cover the area if it draining or weeping.   Follow-up instructions:  Return instructions:   Please return to the Emergency Department if you experience worsening symptoms. Call your doctor sooner or return to the ER if you develop worsening signs of infection such as: increased redness, increased pain, pus, fever, or other symptoms that concern you.  Please return if you have any other emergent concerns.  Additional Information:   Your vital signs today were: BP (!) 154/79 (BP Location: Right Arm)    Pulse 82    Temp 98.1 F (36.7 C) (Oral)    Resp 18    Ht 5\' 11"  (1.803 m)    Wt 97.5 kg (215 lb)    SpO2 97%    BMI 29.99 kg/m  If your blood pressure (BP) was elevated on multiple readings during this visit above 130 for the top number or above 80 for the bottom number, please have this repeated by your primary care provider within one month. --------------  Thank you for allowing [place] to participate in your care today. It was a pleasure taking care of you today!

## 2017-10-22 NOTE — ED Triage Notes (Signed)
Pt c/o swelling to left forearm after falling 1.5 weeks ago. Pt has bruising to left forearm. Denies LOC upon fall.

## 2018-03-02 ENCOUNTER — Emergency Department (HOSPITAL_COMMUNITY): Payer: Medicare Other

## 2018-03-02 ENCOUNTER — Emergency Department (HOSPITAL_COMMUNITY)
Admission: EM | Admit: 2018-03-02 | Discharge: 2018-03-02 | Disposition: A | Discharge status: Home / Self Care | Payer: Medicare Other | Attending: Emergency Medicine | Admitting: Emergency Medicine

## 2018-03-02 ENCOUNTER — Other Ambulatory Visit: Payer: Self-pay

## 2018-03-02 ENCOUNTER — Encounter (HOSPITAL_COMMUNITY): Payer: Self-pay | Admitting: Emergency Medicine

## 2018-03-02 DIAGNOSIS — Z87891 Personal history of nicotine dependence: Secondary | ICD-10-CM | POA: Insufficient documentation

## 2018-03-02 DIAGNOSIS — Z79899 Other long term (current) drug therapy: Secondary | ICD-10-CM | POA: Diagnosis not present

## 2018-03-02 DIAGNOSIS — J441 Chronic obstructive pulmonary disease with (acute) exacerbation: Secondary | ICD-10-CM | POA: Diagnosis not present

## 2018-03-02 DIAGNOSIS — R05 Cough: Secondary | ICD-10-CM | POA: Diagnosis present

## 2018-03-02 DIAGNOSIS — I10 Essential (primary) hypertension: Secondary | ICD-10-CM | POA: Insufficient documentation

## 2018-03-02 LAB — CBC WITH DIFFERENTIAL/PLATELET
BASOS PCT: 0 %
Basophils Absolute: 0 10*3/uL (ref 0.0–0.1)
EOS ABS: 0.2 10*3/uL (ref 0.0–0.7)
Eosinophils Relative: 2 %
HEMATOCRIT: 41.5 % (ref 39.0–52.0)
Hemoglobin: 14.1 g/dL (ref 13.0–17.0)
LYMPHS ABS: 2.1 10*3/uL (ref 0.7–4.0)
Lymphocytes Relative: 23 %
MCH: 32.3 pg (ref 26.0–34.0)
MCHC: 34 g/dL (ref 30.0–36.0)
MCV: 95.2 fL (ref 78.0–100.0)
MONO ABS: 0.5 10*3/uL (ref 0.1–1.0)
MONOS PCT: 6 %
Neutro Abs: 6.2 10*3/uL (ref 1.7–7.7)
Neutrophils Relative %: 69 %
Platelets: 380 10*3/uL (ref 150–400)
RBC: 4.36 MIL/uL (ref 4.22–5.81)
RDW: 13.1 % (ref 11.5–15.5)
WBC: 9.1 10*3/uL (ref 4.0–10.5)

## 2018-03-02 LAB — BASIC METABOLIC PANEL
Anion gap: 12 (ref 5–15)
BUN: 24 mg/dL — ABNORMAL HIGH (ref 6–20)
CALCIUM: 9.1 mg/dL (ref 8.9–10.3)
CO2: 25 mmol/L (ref 22–32)
CREATININE: 0.8 mg/dL (ref 0.61–1.24)
Chloride: 102 mmol/L (ref 101–111)
GFR calc Af Amer: >60 mL/min (ref >60)
GFR calc non Af Amer: >60 mL/min (ref >60)
GLUCOSE: 120 mg/dL — AB (ref 65–99)
Potassium: 3.6 mmol/L (ref 3.5–5.1)
Sodium: 139 mmol/L (ref 135–145)

## 2018-03-02 MED ORDER — PREDNISONE 50 MG PO TABS
60.0000 mg | ORAL_TABLET | Freq: Once | ORAL | Status: AC
  Administered 2018-03-02: 60 mg via ORAL
  Filled 2018-03-02: qty 1

## 2018-03-02 MED ORDER — AZITHROMYCIN 250 MG PO TABS
500.0000 mg | ORAL_TABLET | Freq: Once | ORAL | Status: AC
  Administered 2018-03-02: 500 mg via ORAL
  Filled 2018-03-02: qty 2

## 2018-03-02 MED ORDER — IPRATROPIUM-ALBUTEROL 0.5-2.5 (3) MG/3ML IN SOLN
3.0000 mL | Freq: Once | RESPIRATORY_TRACT | Status: AC
  Administered 2018-03-02: 3 mL via RESPIRATORY_TRACT
  Filled 2018-03-02: qty 3

## 2018-03-02 MED ORDER — LIDOCAINE VISCOUS 2 % MT SOLN
OROMUCOSAL | Status: AC
  Filled 2018-03-02: qty 15

## 2018-03-02 MED ORDER — PREDNISONE 20 MG PO TABS: 60.0000 mg | ORAL_TABLET | Freq: Every day | ORAL | 0 refills | Status: DC

## 2018-03-02 MED ORDER — HYDROCODONE-HOMATROPINE 5-1.5 MG/5ML PO SYRP: 5.0000 mL | ORAL_SOLUTION | Freq: Four times a day (QID) | ORAL | 0 refills | Status: DC | PRN

## 2018-03-02 MED ORDER — AZITHROMYCIN 250 MG PO TABS: 250.0000 mg | ORAL_TABLET | Freq: Every day | ORAL | 0 refills | Status: DC

## 2018-03-02 NOTE — ED Provider Notes (Signed)
[facility] EMERGENCY DEPARTMENT Provider Note   CSN: [id number] Arrival date & time: 03/02/18  1135     History   Chief Complaint Chief Complaint  Patient presents with  . Cough    HPI Roberto Sanchez. is a 72 y.o. male.  He is a history of asthma COPD.  He presents complaining of cough that is been going on for 3 weeks.  He has been to the [place] twice and had a course of prednisone and now is currently on Tessalon Perles and hydrocodone cough syrup.  He denies fever cough is been nonproductive, it is keeping him up at night.  It sometimes will happen spasmodically where he feels like he cannot breathe.  He denies any other illness symptoms, no abdominal pain no nausea no vomiting no diarrhea no urinary symptoms no swollen joints no headache.  No recent travel or unusual exposures.  The history is provided by the patient and the spouse.  Cough  This is a new problem. The current episode started more than 1 week ago. The problem occurs every few minutes. The problem has not changed since onset.The cough is non-productive. There has been no fever. Associated symptoms include sweats and wheezing. Pertinent negatives include no chest pain, no chills, no ear congestion, no ear pain, no headaches, no rhinorrhea, no sore throat, no myalgias, no shortness of breath and no eye redness. He has tried cough syrup and an opioid for the symptoms. The treatment provided no relief. He is not a smoker (former). His past medical history is significant for COPD and asthma.    Past Medical History:  Diagnosis Date  . Anxiety   . Arthritis   . Asthma   . GERD (gastroesophageal reflux disease)   . Hypertension   . Prostate CA ([place])   . Rupture of right triceps tendon 09/01/2015    Patient Active Problem List   Diagnosis Date Noted  . Rupture of right triceps tendon 09/01/2015  . Anxiety 03/20/2015  . Asthma, chronic   . Vertigo 03/19/2015  . Essential hypertension 03/19/2015  . 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 Malignant neoplasm of prostate ([place]) 02/13/2012  . 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 Genuine stress incontinence, male 02/13/2012    Past Surgical History:  Procedure Laterality Date  . KNEE ARTHROSCOPY Right 1980s  . NASAL SINUS SURGERY    . PROSTATE SURGERY    . TONSILLECTOMY  age 80  . TRICEPS TENDON REPAIR Right 09/01/2015   Procedure: RIGHT TRICEPS  REPAIR;  Surgeon: Marchia Bond, MD;  Location: [place];  Service: Orthopedics;  Laterality: Right;        Home Medications    Prior to Admission medications   Medication Sig Start Date End Date Taking? Authorizing Provider  albuterol (PROAIR HFA) 108 (90 Base) MCG/ACT inhaler Inhale into the lungs. 03/16/08   [provider]  ALPRAZolam Duanne Moron) 0.5 MG tablet Take 0.5 mg by mouth 2 (two) times daily as needed. Nerves/sleep    [provider]  azelastine (ASTELIN) 137 MCG/SPRAY nasal spray Place 1 spray into the nose 2 (two) times daily. Reported on 05/07/2016    [provider]  Azelastine-Fluticasone (DYMISTA NA) Place 1 spray into the nose 2 (two) times daily.    [provider]  baclofen (LIORESAL) 10 MG tablet Take 1 tablet (10 mg total) by mouth 3 (three) times daily. As needed for muscle spasm Patient not taking: Reported on 05/07/2016 09/01/15  Marchia Bond, MD  Fluticasone-Salmeterol (ADVAIR) 250-50 MCG/DOSE AEPB Inhale 1 puff into the lungs every 12 (twelve) hours.    [provider]  hydroxypropyl methylcellulose / hypromellose (ISOPTO TEARS / GONIOVISC) 2.5 % ophthalmic solution Place 1 drop into both eyes as needed for dry eyes. Reported on 05/07/2016    [provider]  losartan-hydrochlorothiazide (HYZAAR) 100-12.5 MG per tablet TK 1 T PO QD IN THE MORNING FOR BLOOD PRESSURE 07/17/15   [provider]  meclizine (ANTIVERT) 12.5 MG tablet Take 1 tablet (12.5 mg total) by mouth 2 (two) times  daily as needed for dizziness. Patient not taking: Reported on 05/07/2016 03/20/15   Radene Gunning, NP  montelukast (SINGULAIR) 10 MG tablet Take 10 mg by mouth daily. 03/18/15   [provider]  Multiple Vitamin (MULTIVITAMIN WITH MINERALS) TABS tablet Take 1 tablet by mouth daily.    [provider]  Olopatadine HCl 0.6 % SOLN Two sprays into each nostril twice a day. 05/07/16   Gean Quint, MD  omeprazole (PRILOSEC) 20 MG capsule Take 20 mg by mouth daily. 01/27/15   [provider]  ondansetron (ZOFRAN) 4 MG tablet Take 1 tablet (4 mg total) by mouth every 8 (eight) hours as needed for nausea or vomiting. Patient not taking: Reported on 05/07/2016 09/01/15   Marchia Bond, MD  oxyCODONE-acetaminophen (ROXICET) 5-325 MG tablet Take 1-2 tablets by mouth every 6 (six) hours as needed for severe pain. Patient not taking: Reported on 05/07/2016 09/01/15   Marchia Bond, MD  sennosides-docusate sodium (SENOKOT-S) 8.6-50 MG tablet Take 2 tablets by mouth daily. 09/01/15   Marchia Bond, MD  sildenafil (VIAGRA) 100 MG tablet Take 100 mg by mouth. 08/10/14   [provider]    Family History Family History  Problem Relation Age of Onset  . Asthma Maternal Uncle   . Allergic rhinitis Brother   . Allergic rhinitis Brother   . Angioedema Neg Hx   . Eczema Neg Hx   . Urticaria Neg Hx   . Immunodeficiency Neg Hx     Social History Social History   Tobacco Use  . Smoking status: Former Smoker    Packs/day: 3.00    Years: 20.00    Pack years: 60.00    Types: Cigarettes    Last attempt to quit: 08/30/1983    Years since quitting: 34.5  . Smokeless tobacco: Former Systems developer    Types: [place] date: 08/29/1985  Substance Use Topics  . Alcohol use: Yes    Comment:  2-3 beers daily  . Drug use: No     Allergies   Penicillins and Shellfish allergy   Review of Systems Review of Systems  Constitutional: Negative for chills and fever.  HENT: Negative for  ear pain, rhinorrhea and sore throat.   Eyes: Negative for pain, redness and visual disturbance.  Respiratory: Positive for cough and wheezing. Negative for shortness of breath.   Cardiovascular: Negative for chest pain and palpitations.  Gastrointestinal: Negative for abdominal pain and vomiting.  Genitourinary: Negative for dysuria and hematuria.  Musculoskeletal: Negative for arthralgias, back pain and myalgias.  Skin: Negative for color change and rash.  Neurological: Negative for seizures, syncope and headaches.  All other systems reviewed and are negative.    Physical Exam Updated Vital Signs BP (!) 153/81   Pulse 79   Temp 99.1 F (37.3 C) (Oral)   Resp 17   Ht 5\' 10"  (1.778 m)   Wt 95.3  kg (210 lb)   SpO2 96%   BMI 30.13 kg/m   Physical Exam  Constitutional: He appears well-developed and well-nourished.  HENT:  Head: Normocephalic and atraumatic.  Eyes: Conjunctivae are normal.  Neck: Neck supple.  Cardiovascular: Normal rate and regular rhythm.  No murmur heard. Pulmonary/Chest: Effort normal. No accessory muscle usage. No respiratory distress. He has wheezes (scattered).  Abdominal: Soft. There is no tenderness.  Musculoskeletal: He exhibits no edema, tenderness or deformity.  Neurological: He is alert.  Skin: Skin is warm and dry.  Psychiatric: He has a normal mood and affect.  Nursing note and vitals reviewed.    ED Treatments / Results  Labs (all labs ordered are listed, but only abnormal results are displayed) Labs Reviewed  BASIC METABOLIC PANEL - Abnormal; Notable for the following components:      Result Value   Glucose, Bld 120 (*)    BUN 24 (*)    All other components within normal limits  CBC WITH DIFFERENTIAL/PLATELET    EKG None  Radiology Dg Chest 2 View  Result Date: 03/02/2018 CLINICAL DATA:  Dry cough for several weeks EXAM: CHEST - 2 VIEW COMPARISON:  CT of the chest from 01/27/2012. FINDINGS: Cardiac shadow is within normal  limits. The lungs are mildly hyperinflated consistent with COPD. No focal infiltrate or sizable effusion is noted. Degenerative changes of the thoracic spine are noted. Bone island is noted in the anterior aspect of the right seventh rib anteriorly. This is stable from a prior CT examination dated 01/27/2012. No acute abnormality is noted. IMPRESSION: COPD without acute abnormality. Electronically Signed   By: Inez Catalina M.D.   On: 03/02/2018 12:23    Procedures Procedures (including critical care time)  Medications Ordered in ED Medications  ipratropium-albuterol (DUONEB) 0.5-2.5 (3) MG/3ML nebulizer solution 3 mL (has no administration in time range)  predniSONE (DELTASONE) tablet 60 mg (has no administration in time range)  azithromycin (ZITHROMAX) tablet 500 mg (has no administration in time range)     Initial Impression / Assessment and Plan / ED Course  I have reviewed the triage vital signs and the nursing notes.  Pertinent labs & imaging results that were available during my care of the patient were reviewed by me and considered in my medical decision making (see chart for details).  Clinical Course as of Mar 04 1045  Mon Mar 02, 2018  1817 Reevaluated patient his lungs are much more clear and he has been resting more comfortably.  He feels he can go home and I will write him prescriptions for the steroids antibiotics and another prescription for the hydrocodone cough medicine that he is just about finished with.   [MB]    Clinical Course User Index [MB] Hayden Rasmussen, MD    Final Clinical Impressions(s) / ED Diagnoses   Final diagnoses:  COPD with acute exacerbation [facility])    ED Discharge Orders        Ordered    azithromycin (ZITHROMAX) 250 MG tablet  Daily     03/02/18 1820    HYDROcodone-homatropine (HYCODAN) 5-1.5 MG/5ML syrup  Every 6 hours PRN     03/02/18 1820    predniSONE (DELTASONE) 20 MG tablet  Daily     03/02/18 1820       Hayden Rasmussen,  MD 03/04/18 1047

## 2018-03-02 NOTE — ED Triage Notes (Signed)
Pt states has had dry cough for three weeks. Was seen at [facility] for same and had xray which was negative two weeks ago. Continue to have cough, low grade fever with no relief from cough meds

## 2018-03-02 NOTE — Discharge Instructions (Addendum)
Your evaluated in the emergency department for worsening cough and shortness of breath.  This is likely a COPD exacerbation.  We are prescribing you steroids and antibiotics to take and a cough medicine.  You should return to the emergency department if you have any worsening of your symptoms and otherwise follow-up with your regular doctor.

## 2018-03-02 NOTE — ED Notes (Signed)
Lab called to collect blood-no answer.

## 2018-04-25 DEATH — deceased

## 2018-11-05 ENCOUNTER — Other Ambulatory Visit: Payer: Self-pay

## 2018-11-05 ENCOUNTER — Emergency Department (HOSPITAL_COMMUNITY): Payer: Medicare Other

## 2018-11-05 ENCOUNTER — Encounter (HOSPITAL_COMMUNITY): Payer: Self-pay | Admitting: Emergency Medicine

## 2018-11-05 ENCOUNTER — Emergency Department (HOSPITAL_COMMUNITY)
Admission: EM | Admit: 2018-11-05 | Discharge: 2018-11-05 | Disposition: A | Discharge status: Home / Self Care | Payer: Medicare Other | Attending: Emergency Medicine | Admitting: Emergency Medicine

## 2018-11-05 DIAGNOSIS — R059 Cough, unspecified: Secondary | ICD-10-CM

## 2018-11-05 DIAGNOSIS — J45909 Unspecified asthma, uncomplicated: Secondary | ICD-10-CM | POA: Insufficient documentation

## 2018-11-05 DIAGNOSIS — Z87891 Personal history of nicotine dependence: Secondary | ICD-10-CM | POA: Insufficient documentation

## 2018-11-05 DIAGNOSIS — Z7982 Long term (current) use of aspirin: Secondary | ICD-10-CM | POA: Diagnosis not present

## 2018-11-05 DIAGNOSIS — Z79899 Other long term (current) drug therapy: Secondary | ICD-10-CM | POA: Insufficient documentation

## 2018-11-05 DIAGNOSIS — R05 Cough: Secondary | ICD-10-CM

## 2018-11-05 DIAGNOSIS — J321 Chronic frontal sinusitis: Secondary | ICD-10-CM | POA: Diagnosis not present

## 2018-11-05 DIAGNOSIS — I10 Essential (primary) hypertension: Secondary | ICD-10-CM | POA: Insufficient documentation

## 2018-11-05 LAB — CBC WITH DIFFERENTIAL/PLATELET
Abs Immature Granulocytes: 0.05 10*3/uL (ref 0.00–0.07)
Basophils Absolute: 0 10*3/uL (ref 0.0–0.1)
Basophils Relative: 0 %
Eosinophils Absolute: 0.1 10*3/uL (ref 0.0–0.5)
Eosinophils Relative: 1 %
HCT: 45.7 % (ref 39.0–52.0)
Hemoglobin: 15.4 g/dL (ref 13.0–17.0)
Immature Granulocytes: 1 %
Lymphocytes Relative: 22 %
Lymphs Abs: 1.7 10*3/uL (ref 0.7–4.0)
MCH: 32.1 pg (ref 26.0–34.0)
MCHC: 33.7 g/dL (ref 30.0–36.0)
MCV: 95.2 fL (ref 80.0–100.0)
Monocytes Absolute: 0.5 10*3/uL (ref 0.1–1.0)
Monocytes Relative: 6 %
Neutro Abs: 5.5 10*3/uL (ref 1.7–7.7)
Neutrophils Relative %: 70 %
Platelets: 286 10*3/uL (ref 150–400)
RBC: 4.8 MIL/uL (ref 4.22–5.81)
RDW: 12.6 % (ref 11.5–15.5)
WBC: 7.9 10*3/uL (ref 4.0–10.5)
nRBC: 0 % (ref 0.0–0.2)

## 2018-11-05 LAB — BASIC METABOLIC PANEL
ANION GAP: 7 (ref 5–15)
BUN: 24 mg/dL — AB (ref 8–23)
CHLORIDE: 104 mmol/L (ref 98–111)
CO2: 26 mmol/L (ref 22–32)
Calcium: 8.8 mg/dL — ABNORMAL LOW (ref 8.9–10.3)
Creatinine, Ser: 0.88 mg/dL (ref 0.61–1.24)
GLUCOSE: 104 mg/dL — AB (ref 70–99)
Potassium: 3.6 mmol/L (ref 3.5–5.1)
Sodium: 137 mmol/L (ref 135–145)

## 2018-11-05 MED ORDER — DIPHENHYDRAMINE HCL 50 MG/ML IJ SOLN
25.0000 mg | Freq: Once | INTRAMUSCULAR | Status: AC
  Administered 2018-11-05: 25 mg via INTRAVENOUS
  Filled 2018-11-05: qty 1

## 2018-11-05 MED ORDER — HYDROCOD POLST-CPM POLST ER 10-8 MG/5ML PO SUER
5.0000 mL | Freq: Once | ORAL | Status: AC
  Administered 2018-11-05: 5 mL via ORAL
  Filled 2018-11-05: qty 5

## 2018-11-05 MED ORDER — KETOROLAC TROMETHAMINE 30 MG/ML IJ SOLN
30.0000 mg | Freq: Once | INTRAMUSCULAR | Status: AC
  Administered 2018-11-05: 30 mg via INTRAVENOUS
  Filled 2018-11-05: qty 1

## 2018-11-05 MED ORDER — ALBUTEROL SULFATE (2.5 MG/3ML) 0.083% IN NEBU
2.5000 mg | INHALATION_SOLUTION | Freq: Once | RESPIRATORY_TRACT | Status: AC
  Administered 2018-11-05: 2.5 mg via RESPIRATORY_TRACT
  Filled 2018-11-05: qty 3

## 2018-11-05 MED ORDER — IPRATROPIUM-ALBUTEROL 0.5-2.5 (3) MG/3ML IN SOLN
3.0000 mL | Freq: Once | RESPIRATORY_TRACT | Status: AC
  Administered 2018-11-05: 3 mL via RESPIRATORY_TRACT
  Filled 2018-11-05: qty 3

## 2018-11-05 MED ORDER — HYDROCOD POLST-CPM POLST ER 10-8 MG/5ML PO SUER: 5.0000 mL | Freq: Two times a day (BID) | ORAL | 0 refills | Status: DC

## 2018-11-05 MED ORDER — METOCLOPRAMIDE HCL 5 MG/ML IJ SOLN
10.0000 mg | Freq: Once | INTRAMUSCULAR | Status: AC
  Administered 2018-11-05: 10 mg via INTRAVENOUS
  Filled 2018-11-05: qty 2

## 2018-11-05 NOTE — ED Triage Notes (Signed)
PT c/o recurrent sinus infections over the past few weeks and went to urgent care on 11/01/18 and started Cipro 500mg  BID then. PT states he then developed non-productive cough for the past 3 days.

## 2018-11-05 NOTE — ED Provider Notes (Signed)
[facility] EMERGENCY DEPARTMENT Provider Note   CSN: [id number] Arrival date & time: 11/05/18  0086     History   Chief Complaint Chief Complaint  Patient presents with  . Cough    HPI Roberto Barnette. is a 72 y.o. male.  HPI   Roberto Caddell. is a 72 y.o. male with past medical history of hypertension and asthma ,  presents to the Emergency Department complaining of persistent nasal congestion, sinus pressure, and cough for 3 weeks.  He has been seen at a local urgent care facility x2 for his symptoms without improvement.  He was last seen on 11/01/2018 and given an injection of steroids and started on Cipro 500 mg twice daily.  He reports brief improvement of his symptoms after receiving the steroid injection, but he is continued to have a nonproductive cough and sinus pressure and headache.  He describes the headache as frontal in origin and gradual in onset.  No photophobia or dizziness.  He states he has been on several rounds of steroid tapers for his symptoms which have not helped.  He denies chest pain or shortness of breath.  No fever, chills, hemoptysis, or edema of his extremities.  He states he has not been using his albuterol nebulizer.  Past Medical History:  Diagnosis Date  . Anxiety   . Arthritis   . Asthma   . GERD (gastroesophageal reflux disease)   . Hypertension   . Prostate CA ([place])   . Rupture of right triceps tendon 09/01/2015    Patient Active Problem List   Diagnosis Date Noted  . Rupture of right triceps tendon 09/01/2015  . Anxiety 03/20/2015  . Asthma, chronic   . Vertigo 03/19/2015  . Essential hypertension 03/19/2015  . 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 Malignant neoplasm of prostate ([place]) 02/13/2012  . 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 Genuine stress incontinence, male 02/13/2012    Past Surgical History:  Procedure Laterality Date  . KNEE ARTHROSCOPY Right 1980s  . NASAL SINUS SURGERY    .  PROSTATE SURGERY    . TONSILLECTOMY  age 88  . TRICEPS TENDON REPAIR Right 09/01/2015   Procedure: RIGHT TRICEPS  REPAIR;  Surgeon: Marchia Bond, MD;  Location: [facility];  Service: Orthopedics;  Laterality: Right;        Home Medications    Prior to Admission medications   Medication Sig Start Date End Date Taking? Authorizing Provider  albuterol (PROAIR HFA) 108 (90 Base) MCG/ACT inhaler Inhale 1-2 puffs into the lungs every 6 (six) hours as needed for wheezing or shortness of breath.  03/16/08   [provider]  ALPRAZolam Duanne Moron) 0.5 MG tablet Take 0.5 mg by mouth 2 (two) times daily as needed. Nerves/sleep    [provider]  aspirin EC 81 MG tablet Take 81 mg by mouth at bedtime.    [provider]  Azelastine-Fluticasone (DYMISTA) 137-50 MCG/ACT SUSP Place 1 spray into the nose 2 (two) times daily.    [provider]  azithromycin (ZITHROMAX) 250 MG tablet Take 1 tablet (250 mg total) by mouth daily. 03/02/18   Hayden Rasmussen, MD  Fluticasone-Salmeterol (ADVAIR) 250-50 MCG/DOSE AEPB Inhale 1 puff into the lungs every 12 (twelve) hours.    [provider]  HYDROcodone-homatropine (HYCODAN) 5-1.5 MG/5ML syrup Take 5 mLs by mouth every 6 (six) hours as needed for cough. 03/02/18   Hayden Rasmussen, MD  hydroxypropyl methylcellulose /  hypromellose (ISOPTO TEARS / GONIOVISC) 2.5 % ophthalmic solution Place 1 drop into both eyes as needed for dry eyes. Reported on 05/07/2016    [provider]  losartan-hydrochlorothiazide (HYZAAR) 100-12.5 MG per tablet TAKE ONE TABLET BY MOUTH ONCE DAILY IN EVENING FOR BLOOD PRESSURE 07/17/15   [provider]  montelukast (SINGULAIR) 10 MG tablet Take 10 mg by mouth daily. 03/18/15   [provider]  Multiple Vitamin (MULTIVITAMIN WITH MINERALS) TABS tablet Take 1 tablet by mouth daily.    [provider]  omeprazole (PRILOSEC) 20 MG capsule Take 20 mg by mouth  daily. 01/27/15   [provider]  predniSONE (DELTASONE) 20 MG tablet Take 3 tablets (60 mg total) by mouth daily. 03/02/18   Hayden Rasmussen, MD  sildenafil (VIAGRA) 100 MG tablet Take 100 mg by mouth as needed for erectile dysfunction.  08/10/14   [provider]    Family History Family History  Problem Relation Age of Onset  . Asthma Maternal Uncle   . Allergic rhinitis Brother   . Allergic rhinitis Brother   . Angioedema Neg Hx   . Eczema Neg Hx   . Urticaria Neg Hx   . Immunodeficiency Neg Hx     Social History Social History   Tobacco Use  . Smoking status: Former Smoker    Packs/day: 3.00    Years: 20.00    Pack years: 60.00    Types: Cigarettes    Last attempt to quit: 08/30/1983    Years since quitting: 35.2  . Smokeless tobacco: Former Systems developer    Types: [place] date: 08/29/1985  Substance Use Topics  . Alcohol use: Yes    Comment: occassionaly  . Drug use: No     Allergies   Penicillins and Shellfish allergy   Review of Systems Review of Systems  Constitutional: Negative for appetite change, chills and fever.  HENT: Positive for congestion, sinus pressure and sinus pain. Negative for ear pain, sore throat and trouble swallowing.   Eyes: Negative for visual disturbance.  Respiratory: Positive for cough. Negative for shortness of breath and wheezing.   Cardiovascular: Negative for chest pain and leg swelling.  Gastrointestinal: Negative for abdominal pain, nausea and vomiting.  Genitourinary: Negative for dysuria and flank pain.  Musculoskeletal: Negative for arthralgias and myalgias.  Skin: Negative for rash.  Neurological: Positive for headaches. Negative for dizziness, syncope, weakness and numbness.  Hematological: Negative for adenopathy.  Psychiatric/Behavioral: Negative for confusion.     Physical Exam Updated Vital Signs BP (!) 152/81 (BP Location: Right Arm)   Pulse 78   Temp 97.8 F (36.6 C) (Oral)   Resp 18   Ht 5'  10" (1.778 m)   Wt 97.5 kg   SpO2 96%   BMI 30.85 kg/m   Physical Exam Vitals signs and nursing note reviewed.  Constitutional:      General: He is not in acute distress.    Appearance: He is not toxic-appearing or diaphoretic.  HENT:     Right Ear: Tympanic membrane and ear canal normal.     Left Ear: Tympanic membrane and ear canal normal.     Nose: Mucosal edema present. No rhinorrhea.     Right Sinus: Frontal sinus tenderness present.     Left Sinus: Frontal sinus tenderness present.     Mouth/Throat:     Mouth: Mucous membranes are moist.     Pharynx: Oropharynx is clear. Uvula midline. No oropharyngeal exudate or posterior  oropharyngeal erythema.  Eyes:     Conjunctiva/sclera: Conjunctivae normal.  Neck:     Musculoskeletal: Full passive range of motion without pain and normal range of motion. No neck rigidity or pain with movement.     Trachea: Phonation normal.     Meningeal: Kernig's sign absent.  Cardiovascular:     Rate and Rhythm: Normal rate and regular rhythm.     Pulses: Normal pulses.  Pulmonary:     Effort: Pulmonary effort is normal. No respiratory distress.     Breath sounds: No wheezing.     Comments: Lung sounds are diminished bilaterally.  No wheezes or crackles. Abdominal:     General: There is no distension.     Tenderness: There is no abdominal tenderness.  Musculoskeletal: Normal range of motion.        General: No swelling.  Lymphadenopathy:     Cervical: No cervical adenopathy.  Skin:    General: Skin is warm.     Capillary Refill: Capillary refill takes less than 2 seconds.     Findings: No rash.  Neurological:     General: No focal deficit present.     Mental Status: He is alert.     Sensory: No sensory deficit.  Psychiatric:        Mood and Affect: Mood normal.      ED Treatments / Results  Labs (all labs ordered are listed, but only abnormal results are displayed) Labs Reviewed  BASIC METABOLIC PANEL - Abnormal; Notable for the  following components:      Result Value   Glucose, Bld 104 (*)    BUN 24 (*)    Calcium 8.8 (*)    All other components within normal limits  CBC WITH DIFFERENTIAL/PLATELET    EKG EKG Interpretation  Date/Time:  Thursday November 05 2018 10:06:33 EST Ventricular Rate:  69 PR Interval:    QRS Duration: 133 QT Interval:  408 QTC Calculation: 438 R Axis:   -72 Text Interpretation:  Sinus rhythm Right bundle branch block Inferior infarct, old Confirmed by Milton Ferguson [phone]) on 11/05/2018 10:09:48 AM   Radiology Dg Chest 2 View  Result Date: 11/05/2018 CLINICAL DATA:  Coughing congestion, some weakness, headache EXAM: CHEST - 2 VIEW COMPARISON:  Chest x-ray of 03/02/2018 FINDINGS: No active infiltrate or effusion is seen. Mild biapical pleural thickening is stable. Mediastinal and hilar contours are unremarkable and the heart is within normal limits in size. There are degenerative changes diffusely throughout the thoracic spine. IMPRESSION: No active cardiopulmonary disease. Electronically Signed   By: Ivar Drape M.D.   On: 11/05/2018 09:47     Procedures Procedures (including critical care time)  Medications Ordered in ED Medications  ipratropium-albuterol (DUONEB) 0.5-2.5 (3) MG/3ML nebulizer solution 3 mL (3 mLs Nebulization Given 11/05/18 1048)  albuterol (PROVENTIL) (2.5 MG/3ML) 0.083% nebulizer solution 2.5 mg (2.5 mg Nebulization Given 11/05/18 1047)  chlorpheniramine-HYDROcodone (TUSSIONEX) 10-8 MG/5ML suspension 5 mL (5 mLs Oral Given 11/05/18 1112)  ketorolac (TORADOL) 30 MG/ML injection 30 mg (30 mg Intravenous Given 11/05/18 1246)  metoCLOPramide (REGLAN) injection 10 mg (10 mg Intravenous Given 11/05/18 1247)  diphenhydrAMINE (BENADRYL) injection 25 mg (25 mg Intravenous Given 11/05/18 1244)     Initial Impression / Assessment and Plan / ED Course  I have reviewed the triage vital signs and the nursing notes.  Pertinent labs & imaging results that were  available during my care of the patient were reviewed by me and considered in my medical decision making (  see chart for details).     Patient well-appearing.  Vitals reviewed.  No tachypnea or tachycardia.  No hypoxia.  Symptoms are felt to be respiratory in origin.  Doubt ACS.  No focal neuro deficits, no nuchal rigidity.  Headache is felt to be related to sinus pressure and pain.  On recheck, patient reports feeling much better after albuterol neb and medication for headache.  He remains neurovascularly intact.  Feel that patient's symptoms are viral in origin, he is currently taking Cipro prescribed from previous urgent care visit.  Will have patient discontinue, albuterol MDI dispensed.  He has been on several previous rounds of prednisone, so I will withhold additional prednisone.  He appears appropriate for discharge home.  Strict return precautions were discussed.  Patient agrees to plan.  Final Clinical Impressions(s) / ED Diagnoses   Final diagnoses:  Cough  Chronic frontal sinusitis    ED Discharge Orders         Ordered    chlorpheniramine-HYDROcodone (TUSSIONEX PENNKINETIC ER) 10-8 MG/5ML SUER  2 times daily     11/05/18 1324           Kem Parkinson, PA-C 11/07/18 1927    Milton Ferguson, MD 11/09/18 1011

## 2018-11-05 NOTE — Discharge Instructions (Addendum)
Use your nasal spray and albuterol inhaler as directed.  Follow-up with your ENT provider at the [facility].  Return here for any worsening symptoms

## 2018-11-05 NOTE — ED Notes (Signed)
RT notified for neb tx. 

## 2018-11-13 ENCOUNTER — Emergency Department (HOSPITAL_COMMUNITY)
Admission: EM | Admit: 2018-11-13 | Discharge: 2018-11-13 | Disposition: A | Discharge status: Home / Self Care | Payer: Medicare Other | Attending: Emergency Medicine | Admitting: Emergency Medicine

## 2018-11-13 ENCOUNTER — Emergency Department (HOSPITAL_COMMUNITY): Payer: Medicare Other

## 2018-11-13 ENCOUNTER — Encounter (HOSPITAL_COMMUNITY): Payer: Self-pay | Admitting: Emergency Medicine

## 2018-11-13 ENCOUNTER — Other Ambulatory Visit: Payer: Self-pay

## 2018-11-13 DIAGNOSIS — Z79899 Other long term (current) drug therapy: Secondary | ICD-10-CM | POA: Insufficient documentation

## 2018-11-13 DIAGNOSIS — J45909 Unspecified asthma, uncomplicated: Secondary | ICD-10-CM | POA: Diagnosis not present

## 2018-11-13 DIAGNOSIS — R05 Cough: Secondary | ICD-10-CM | POA: Diagnosis present

## 2018-11-13 DIAGNOSIS — I10 Essential (primary) hypertension: Secondary | ICD-10-CM | POA: Insufficient documentation

## 2018-11-13 DIAGNOSIS — Z87891 Personal history of nicotine dependence: Secondary | ICD-10-CM | POA: Insufficient documentation

## 2018-11-13 DIAGNOSIS — J189 Pneumonia, unspecified organism: Secondary | ICD-10-CM

## 2018-11-13 LAB — BASIC METABOLIC PANEL
Anion gap: 9 (ref 5–15)
BUN: 21 mg/dL (ref 8–23)
CO2: 28 mmol/L (ref 22–32)
Calcium: 9 mg/dL (ref 8.9–10.3)
Chloride: 99 mmol/L (ref 98–111)
Creatinine, Ser: 0.89 mg/dL (ref 0.61–1.24)
GFR calc Af Amer: >60 mL/min (ref >60)
GFR calc non Af Amer: >60 mL/min (ref >60)
Glucose, Bld: 108 mg/dL — ABNORMAL HIGH (ref 70–99)
Potassium: 3.7 mmol/L (ref 3.5–5.1)
Sodium: 136 mmol/L (ref 135–145)

## 2018-11-13 LAB — CBC
HCT: 41.5 % (ref 39.0–52.0)
Hemoglobin: 13.9 g/dL (ref 13.0–17.0)
MCH: 31.9 pg (ref 26.0–34.0)
MCHC: 33.5 g/dL (ref 30.0–36.0)
MCV: 95.2 fL (ref 80.0–100.0)
Platelets: 332 10*3/uL (ref 150–400)
RBC: 4.36 MIL/uL (ref 4.22–5.81)
RDW: 12.3 % (ref 11.5–15.5)
WBC: 17.7 10*3/uL — ABNORMAL HIGH (ref 4.0–10.5)
nRBC: 0 % (ref 0.0–0.2)

## 2018-11-13 MED ORDER — DOXYCYCLINE HYCLATE 100 MG PO CAPS: 100.0000 mg | ORAL_CAPSULE | Freq: Two times a day (BID) | ORAL | 0 refills | Status: DC

## 2018-11-13 MED ORDER — IPRATROPIUM-ALBUTEROL 0.5-2.5 (3) MG/3ML IN SOLN
3.0000 mL | Freq: Once | RESPIRATORY_TRACT | Status: AC
  Administered 2018-11-13: 3 mL via RESPIRATORY_TRACT
  Filled 2018-11-13: qty 3

## 2018-11-13 MED ORDER — PREDNISONE 20 MG PO TABS: 40.0000 mg | ORAL_TABLET | Freq: Every day | ORAL | 0 refills | Status: AC

## 2018-11-13 MED ORDER — PREDNISONE 50 MG PO TABS
50.0000 mg | ORAL_TABLET | Freq: Once | ORAL | Status: AC
  Administered 2018-11-13: 50 mg via ORAL
  Filled 2018-11-13: qty 1

## 2018-11-13 MED ORDER — BENZONATATE 100 MG PO CAPS: 100.0000 mg | ORAL_CAPSULE | Freq: Three times a day (TID) | ORAL | 0 refills | Status: DC

## 2018-11-13 NOTE — ED Triage Notes (Addendum)
Patient complaining of non-productive cough and O2 84% at home. NAD noted at triage. O2 saturation 98% on room air in triage. Denies shortness of breath at this time. States he took home nebulizer treatment prior to arrival to ER.

## 2018-11-13 NOTE — ED Notes (Signed)
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

## 2018-11-13 NOTE — ED Provider Notes (Signed)
[facility] EMERGENCY DEPARTMENT Provider Note   CSN: [id number] Arrival date & time: 11/13/18  1125     History   Chief Complaint Chief Complaint  Patient presents with  . Cough    HPI Roberto Sanchez. is a 72 y.o. male.  HPI Patient presented to the emergency room for evaluation of a cough.  Patient states his symptoms started a couple of days ago.  This morning he was feeling short of breath.  He checked his oxygen saturation and it was 84%.  Patient is not normally on oxygen but does use breathing treatments.  He gave himself a breathing treatment prior to arrival.  He is feeling better now after that treatment.  He denies any fevers or chills.  No chest pain.  No leg swelling. Past Medical History:  Diagnosis Date  . Anxiety   . Arthritis   . Asthma   . GERD (gastroesophageal reflux disease)   . Hypertension   . Prostate CA ([place])   . Rupture of right triceps tendon 09/01/2015    Patient Active Problem List   Diagnosis Date Noted  . Rupture of right triceps tendon 09/01/2015  . Anxiety 03/20/2015  . Asthma, chronic   . Vertigo 03/19/2015  . Essential hypertension 03/19/2015  . Adenocarcinoma of prostate ([place]) 10/04/2013  . Malignant neoplasm of prostate ([place]) 02/13/2012  . 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 Genuine stress incontinence, male 02/13/2012    Past Surgical History:  Procedure Laterality Date  . KNEE ARTHROSCOPY Right 1980s  . NASAL SINUS SURGERY    . PROSTATE SURGERY    . TONSILLECTOMY  age 27  . TRICEPS TENDON REPAIR Right 09/01/2015   Procedure: RIGHT TRICEPS  REPAIR;  Surgeon: Marchia Bond, MD;  Location: [facility];  Service: Orthopedics;  Laterality: Right;        Home Medications    Prior to Admission medications   Medication Sig Start Date End Date Taking? Authorizing Provider  albuterol (PROAIR HFA) 108 (90 Base) MCG/ACT inhaler Inhale 1-2 puffs into the lungs every 6 (six) hours as needed  for wheezing or shortness of breath.  03/16/08  Yes [provider]  ALPRAZolam (XANAX) 0.5 MG tablet Take 0.5 mg by mouth 2 (two) times daily as needed. Nerves/sleep   Yes [provider]  aspirin EC 81 MG tablet Take 81 mg by mouth at bedtime.   Yes [provider]  Azelastine-Fluticasone (DYMISTA) 137-50 MCG/ACT SUSP Place 1 spray into the nose 2 (two) times daily.   Yes [provider]  Fluticasone-Salmeterol (ADVAIR) 250-50 MCG/DOSE AEPB Inhale 1 puff into the lungs every 12 (twelve) hours.   Yes [provider]  hydroxypropyl methylcellulose / hypromellose (ISOPTO TEARS / GONIOVISC) 2.5 % ophthalmic solution Place 1 drop into both eyes as needed for dry eyes. Reported on 05/07/2016   Yes [provider]  losartan-hydrochlorothiazide (HYZAAR) 100-12.5 MG per tablet TAKE ONE TABLET BY MOUTH ONCE DAILY IN EVENING FOR BLOOD PRESSURE 07/17/15  Yes [provider]  montelukast (SINGULAIR) 10 MG tablet Take 10 mg by mouth daily. 03/18/15  Yes [provider]  Multiple Vitamin (MULTIVITAMIN WITH MINERALS) TABS tablet Take 1 tablet by mouth daily.   Yes [provider]  niacin 500 MG tablet Take 500 mg by mouth at bedtime.   Yes [provider]  olopatadine (PATANOL) 0.1 % ophthalmic solution Place 1 drop into both eyes 2 times daily. 12/26/16  Yes [provider]  omeprazole (PRILOSEC) 20 MG capsule Take 20 mg by mouth daily. 01/27/15  Yes [provider]  benzonatate (TESSALON) 100 MG capsule Take 1 capsule (100 mg total) by mouth every 8 (eight) hours. 11/13/18   Dorie Rank, MD  chlorpheniramine-HYDROcodone [facility] PENNKINETIC ER) 10-8 MG/5ML SUER Take 5 mLs by mouth 2 (two) times daily. Patient not taking: Reported on 11/13/2018 11/05/18   Kem Parkinson, PA-C  doxycycline (VIBRAMYCIN) 100 MG capsule Take 1 capsule (100 mg total) by mouth 2 (two) times daily. 11/13/18   Dorie Rank, MD  predniSONE  (DELTASONE) 20 MG tablet Take 2 tablets (40 mg total) by mouth daily for 5 days. 11/13/18 11/18/18  Dorie Rank, MD    Family History Family History  Problem Relation Age of Onset  . Asthma Maternal Uncle   . Allergic rhinitis Brother   . Allergic rhinitis Brother   . Angioedema Neg Hx   . Eczema Neg Hx   . Urticaria Neg Hx   . Immunodeficiency Neg Hx     Social History Social History   Tobacco Use  . Smoking status: Former Smoker    Packs/day: 3.00    Years: 20.00    Pack years: 60.00    Types: Cigarettes    Last attempt to quit: 08/30/1983    Years since quitting: 35.2  . Smokeless tobacco: Former Systems developer    Types: [place] date: 08/29/1985  Substance Use Topics  . Alcohol use: Yes    Comment: occassionaly  . Drug use: No     Allergies   Penicillins and Shellfish allergy   Review of Systems Review of Systems  All other systems reviewed and are negative.    Physical Exam Updated Vital Signs BP 137/81 (BP Location: Right Arm)   Pulse 90   Temp 98 F (36.7 C) (Temporal)   Resp 20   Ht 1.778 m (5\' 10" )   Wt 95.3 kg   SpO2 96%   BMI 30.13 kg/m   Physical Exam Vitals signs and nursing note reviewed.  Constitutional:      General: He is not in acute distress.    Appearance: He is well-developed. He is not ill-appearing, toxic-appearing or diaphoretic.  HENT:     Head: Normocephalic and atraumatic.     Right Ear: External ear normal.     Left Ear: External ear normal.  Eyes:     General: No scleral icterus.       Right eye: No discharge.        Left eye: No discharge.     Conjunctiva/sclera: Conjunctivae normal.  Neck:     Musculoskeletal: Neck supple.     Trachea: No tracheal deviation.  Cardiovascular:     Rate and Rhythm: Normal rate and regular rhythm.  Pulmonary:     Effort: Pulmonary effort is normal. No respiratory distress.     Breath sounds: No stridor. Wheezing present. No rales.  Abdominal:     General: Bowel sounds are normal.  There is no distension.     Palpations: Abdomen is soft.     Tenderness: There is no abdominal tenderness. There is no guarding or rebound.  Musculoskeletal:        General: No tenderness.  Skin:    General: Skin is warm and dry.     Findings: No rash.  Neurological:     Mental Status: He is alert.     Cranial Nerves: No cranial nerve deficit (no facial droop, extraocular movements intact, no  slurred speech).     Sensory: No sensory deficit.     Motor: No abnormal muscle tone or seizure activity.     Coordination: Coordination normal.      ED Treatments / Results  Labs (all labs ordered are listed, but only abnormal results are displayed) Labs Reviewed  CBC - Abnormal; Notable for the following components:      Result Value   WBC 17.7 (*)    All other components within normal limits  BASIC METABOLIC PANEL - Abnormal; Notable for the following components:   Glucose, Bld 108 (*)    All other components within normal limits     Radiology Dg Chest 2 View  Result Date: 11/13/2018 CLINICAL DATA:  Cough.  History of prostate carcinoma EXAM: CHEST - 2 VIEW COMPARISON:  November 05, 2018 FINDINGS: There is atelectatic change in the anterior left base. Lungs elsewhere are clear. The heart size and pulmonary vascularity are normal. No adenopathy. There is degenerative change in the thoracic spine. IMPRESSION: Atelectasis anterior left base. Question early developing pneumonia in this area. Lungs elsewhere clear. Stable cardiac silhouette. Electronically Signed   By: Lowella Grip III M.D.   On: 11/13/2018 11:45    Procedures Procedures (including critical care time)  Medications Ordered in ED Medications  ipratropium-albuterol (DUONEB) 0.5-2.5 (3) MG/3ML nebulizer solution 3 mL (3 mLs Nebulization Given 11/13/18 1331)  predniSONE (DELTASONE) tablet 50 mg (50 mg Oral Given 11/13/18 1412)     Initial Impression / Assessment and Plan / ED Course  I have reviewed the triage  vital signs and the nursing notes.  Pertinent labs & imaging results that were available during my care of the patient were reviewed by me and considered in my medical decision making (see chart for details).   Patient presented to the emergency room for evaluation of cough and congestion.  At home his oxygen saturation was decreased.  Patient gave himself a breathing treatment.  He has not been hypoxic the whole time he has been in the emergency room.  Did have some mild wheezing on exam.  I suspect his symptoms are related to a combination of the possible pneumonia noted on x-ray as well as bronchospasm.  He is nontoxic and in no distress.  He does have an elevated white blood cell count but is afebrile and does not appear systemically ill.  Plan on discharge home with a course of antibiotics as well as Tessalon for his cough and prednisone.  Warning signs precautions discussed.  Final Clinical Impressions(s) / ED Diagnoses   Final diagnoses:  Community acquired pneumonia, unspecified laterality    ED Discharge Orders         Ordered    benzonatate (TESSALON) 100 MG capsule  Every 8 hours     11/13/18 1410    doxycycline (VIBRAMYCIN) 100 MG capsule  2 times daily     11/13/18 1410    predniSONE (DELTASONE) 20 MG tablet  Daily     11/13/18 1410           Dorie Rank, MD 11/13/18 1424

## 2018-11-13 NOTE — Discharge Instructions (Addendum)
Take the antibiotics as prescribed, follow-up with your doctor next week to be rechecked, return to the emergency room as needed for worsening symptoms

## 2019-07-02 ENCOUNTER — Ambulatory Visit
Admission: EM | Admit: 2019-07-02 | Discharge: 2019-07-02 | Disposition: A | Discharge status: Home / Self Care | Payer: Medicare Other | Attending: Emergency Medicine | Admitting: Emergency Medicine

## 2019-07-02 ENCOUNTER — Other Ambulatory Visit: Payer: Self-pay

## 2019-07-02 ENCOUNTER — Encounter: Payer: Self-pay | Admitting: Emergency Medicine

## 2019-07-02 DIAGNOSIS — J0110 Acute frontal sinusitis, unspecified: Secondary | ICD-10-CM

## 2019-07-02 DIAGNOSIS — R0981 Nasal congestion: Secondary | ICD-10-CM | POA: Diagnosis not present

## 2019-07-02 MED ORDER — DOXYCYCLINE HYCLATE 100 MG PO CAPS: 100.0000 mg | ORAL_CAPSULE | Freq: Two times a day (BID) | ORAL | 0 refills | Status: DC

## 2019-07-02 MED ORDER — PREDNISONE 20 MG PO TABS: 20.0000 mg | ORAL_TABLET | Freq: Two times a day (BID) | ORAL | 0 refills | Status: AC

## 2019-07-02 MED ORDER — ACETAMINOPHEN 325 MG PO TABS
650.0000 mg | ORAL_TABLET | Freq: Once | ORAL | Status: AC
  Administered 2019-07-02: 11:00:00 650 mg via ORAL

## 2019-07-02 NOTE — ED Provider Notes (Addendum)
[place]   [id number] 07/02/19 Arrival Time: 9024   CC: sinus congestion  SUBJECTIVE: History from: patient.  Perez Dirico. is a 73 y.o. male who presents with sinus congestion, pain, and headache x 2 weeks.  Denies sick exposure to COVID, flu or strep.  Denies recent travel.  Localizes symptoms to the ethmoid/ frontal sinuses.  States the pain is constant.  5/10.  Has NOT tried OTC medications.  Symptoms are made worse with leaning forward.  Reports previous symptoms in the past and diagnosed with sinus infection.   Denies fever, chills, fatigue, rhinorrhea, sore throat, cough, SOB, wheezing, chest pain, nausea, changes in bowel or bladder habits.    ROS: As per HPI.  All other pertinent ROS negative.     Past Medical History:  Diagnosis Date  . Anxiety   . Arthritis   . Asthma   . GERD (gastroesophageal reflux disease)   . Hypertension   . Prostate CA ([place])   . Rupture of right triceps tendon 09/01/2015   Past Surgical History:  Procedure Laterality Date  . KNEE ARTHROSCOPY Right 1980s  . NASAL SINUS SURGERY    . PROSTATE SURGERY    . TONSILLECTOMY  age 73  . TRICEPS TENDON REPAIR Right 09/01/2015   Procedure: RIGHT TRICEPS  REPAIR;  Surgeon: Marchia Bond, MD;  Location: [place];  Service: Orthopedics;  Laterality: Right;   Allergies  Allergen Reactions  . Penicillins Other (See Comments)    Causes patient to "pass out"DID THE REACTION INVOLVE: Swelling of the face/tongue/throat, SOB, or low BP? No Sudden or severe rash/hives, skin peeling, or the inside of the mouth or nose? No Did it require medical treatment? No When did it last happen? If all above answers are "NO", may proceed with cephalosporin use.   . Shellfish Allergy Swelling, Hives and Shortness Of Breath    Facial Swelling  ears turn red Facial Swelling     No current facility-administered medications on file prior to encounter.    Current Outpatient Medications  on File Prior to Encounter  Medication Sig Dispense Refill  . albuterol (PROAIR HFA) 108 (90 Base) MCG/ACT inhaler Inhale 1-2 puffs into the lungs every 6 (six) hours as needed for wheezing or shortness of breath.     . ALPRAZolam (XANAX) 0.5 MG tablet Take 0.5 mg by mouth 2 (two) times daily as needed. Nerves/sleep    . aspirin EC 81 MG tablet Take 81 mg by mouth at bedtime.    . Azelastine-Fluticasone (DYMISTA) 137-50 MCG/ACT SUSP Place 1 spray into the nose 2 (two) times daily.    . chlorpheniramine-HYDROcodone (TUSSIONEX PENNKINETIC ER) 10-8 MG/5ML SUER Take 5 mLs by mouth 2 (two) times daily. (Patient not taking: Reported on 11/13/2018) 70 mL 0  . Fluticasone-Salmeterol (ADVAIR) 250-50 MCG/DOSE AEPB Inhale 1 puff into the lungs every 12 (twelve) hours.    . hydroxypropyl methylcellulose / hypromellose (ISOPTO TEARS / GONIOVISC) 2.5 % ophthalmic solution Place 1 drop into both eyes as needed for dry eyes. Reported on 05/07/2016    . losartan-hydrochlorothiazide (HYZAAR) 100-12.5 MG per tablet TAKE ONE TABLET BY MOUTH ONCE DAILY IN EVENING FOR BLOOD PRESSURE  11  . montelukast (SINGULAIR) 10 MG tablet Take 10 mg by mouth daily.    . Multiple Vitamin (MULTIVITAMIN WITH MINERALS) TABS tablet Take 1 tablet by mouth daily.    . niacin 500 MG tablet Take 500 mg by mouth at bedtime.    Marland Kitchen olopatadine (PATANOL) 0.1 %  ophthalmic solution Place 1 drop into both eyes 2 times daily.    Marland Kitchen omeprazole (PRILOSEC) 20 MG capsule Take 20 mg by mouth daily.  3   Social History   Socioeconomic History  . Marital status: Married    Spouse name: Not on file  . Number of children: Not on file  . Years of education: Not on file  . Highest education level: Not on file  Occupational History  . Not on file  Social Needs  . Financial resource strain: Not on file  . Food insecurity    Worry: Not on file    Inability: Not on file  . Transportation needs    Medical: Not on file    Non-medical: Not on file   Tobacco Use  . Smoking status: Former Smoker    Packs/day: 3.00    Years: 20.00    Pack years: 60.00    Types: Cigarettes    Quit date: 08/30/1983    Years since quitting: 35.8  . Smokeless tobacco: Former Systems developer    Types: [place] date: 08/29/1985  Substance and Sexual Activity  . Alcohol use: Yes    Comment: occassionaly  . Drug use: No  . Sexual activity: Not on file  Lifestyle  . Physical activity    Days per week: Not on file    Minutes per session: Not on file  . Stress: Not on file  Relationships  . Social Herbalist on phone: Not on file    Gets together: Not on file    Attends religious service: Not on file    Active member of club or organization: Not on file    Attends meetings of clubs or organizations: Not on file    Relationship status: Not on file  . Intimate partner violence    Fear of current or ex partner: Not on file    Emotionally abused: Not on file    Physically abused: Not on file    Forced sexual activity: Not on file  Other Topics Concern  . Not on file  Social History Narrative  . Not on file   Family History  Problem Relation Age of Onset  . Asthma Maternal Uncle   . Allergic rhinitis Brother   . Allergic rhinitis Brother   . Angioedema Neg Hx   . Eczema Neg Hx   . Urticaria Neg Hx   . Immunodeficiency Neg Hx     OBJECTIVE:  Vitals:   07/02/19 1015  BP: 122/77  Pulse: 69  Resp: 20  Temp: 97.7 F (36.5 C)  SpO2: 98%     General appearance: alert; appears mildly fatigued, but nontoxic; speaking in full sentences and tolerating own secretions HEENT: NCAT; Ears: EACs ceruminous, TMs appear pearly gray, partly visualized; Eyes: PERRL.  EOM grossly intact. Sinuses: TTP over frontal sinuses; Nose: nares patent without rhinorrhea, LT sided deviated septum, Throat: oropharynx clear, tonsils non erythematous or enlarged, uvula midline  Neck: supple without LAD Lungs: unlabored respirations, symmetrical air entry; cough:  absent; no respiratory distress; CTAB Heart: regular rate and rhythm.   Skin: warm and dry Psychological: alert and cooperative; normal mood and affect  ASSESSMENT & PLAN:  1. Sinus congestion   2. Acute non-recurrent frontal sinusitis     Meds ordered this encounter  Medications  . doxycycline (VIBRAMYCIN) 100 MG capsule    Sig: Take 1 capsule (100 mg total) by mouth 2 (two) times daily.    Dispense:  20 capsule    Refill:  0    Order Specific Question:   Supervising Provider    Answer:   Raylene Everts [[id number]]  . acetaminophen (TYLENOL) tablet 650 mg   Tylenol given in office Rest and push fluids Use OTC zyrtec and flonase as needed for symptomatic relief Doxycycline prescribed.  Take as directed and to completion Use OTC ibuprofen/tylenol as needed for pain Follow up with PCP if symptoms persists Return or go to the ED if you have any new or worsening symptoms such as fever, chills, worsening sinus pain/pressure, cough, sore throat, chest pain, shortness of breath, abdominal pain, changes in bowel or bladder habits, etc...   Prednisone requested per patient  Reviewed expectations re: course of current medical issues. Questions answered. Outlined signs and symptoms indicating need for more acute intervention. Patient verbalized understanding. After Visit Summary given.     Lestine Box, PA-C 07/02/19 1046

## 2019-07-02 NOTE — ED Triage Notes (Signed)
Sinus headache for past 2 weeks, denies cough or fever

## 2019-07-02 NOTE — Discharge Instructions (Signed)
Rest and push fluids Use OTC zyrtec and flonase as needed for symptomatic relief Doxycycline prescribed.  Take as directed and to completion Use OTC ibuprofen/tylenol as needed for pain Follow up with PCP if symptoms persists Return or go to the ED if you have any new or worsening symptoms such as fever, chills, worsening sinus pain/pressure, cough, sore throat, chest pain, shortness of breath, abdominal pain, changes in bowel or bladder habits, etc..Marland Kitchen

## 2020-04-20 ENCOUNTER — Encounter: Payer: Self-pay | Admitting: Nurse Practitioner

## 2020-04-20 ENCOUNTER — Ambulatory Visit (INDEPENDENT_AMBULATORY_CARE_PROVIDER_SITE_OTHER): Payer: No Typology Code available for payment source | Admitting: Nurse Practitioner

## 2020-04-20 ENCOUNTER — Other Ambulatory Visit: Payer: Self-pay

## 2020-04-20 DIAGNOSIS — Z800 Family history of malignant neoplasm of digestive organs: Secondary | ICD-10-CM | POA: Diagnosis not present

## 2020-04-20 DIAGNOSIS — K5900 Constipation, unspecified: Secondary | ICD-10-CM | POA: Diagnosis not present

## 2020-04-20 NOTE — Patient Instructions (Signed)
Your health issues we discussed today were:   Constipation and bloating: 1. I am giving you samples of Linzess 72 mcg.  Take this once a day, first thing in the morning on an empty stomach 2. Call us in 1 to 2 weeks and let us know if it is helping you have better bowel movements 3. Call us before then if you have diarrhea that lasts more than 5 to 7 days or if the diarrhea is intolerable 4. We can make medication changes over the phone depending on how you respond to Linzess 5. Hold your stool softener while you start Linzess 6. Call us for any worsening or severe symptoms  Need for colonoscopy: 1. You are due for a colonoscopy, especially with your family history of colon cancer in your father 2. We will proceed with scheduling at this time.  [facility] to ensure the [facility] have been obtained 3. Call us if you have any obvious rectal bleeding or other concerns  Overall I recommend:  1. Continue your other current medications 2. Return for follow-up in 3 months 3. Call us if you have any questions or concerns.   ---------------------------------------------------------------  I am glad you have gotten your COVID-19 vaccination!  Even though you are fully vaccinated you should continue to follow CDC and state/local guidelines.  ---------------------------------------------------------------   At [facility] we value your feedback. You may receive a survey about your visit today. Please share your experience as we strive to create trusting relationships with our patients to provide genuine, compassionate, quality care.  We appreciate your understanding and patience as we review any laboratory studies, imaging, and other diagnostic tests that are ordered as we care for you. Our office policy is 5 business days for review of these results, and any emergent or urgent results are addressed in a timely manner for your best interest. If  you do not hear from our office in 1 week, please contact us.   We also encourage the use of MyChart, which contains your medical information for your review as well. If you are not enrolled in this feature, an access code is on this after visit summary for your convenience. Thank you for allowing [place] to be involved in your care.  It was great to see you today!  I hope you have a great Summer!!

## 2020-04-20 NOTE — Progress Notes (Signed)
Primary Care Physician:  Cl[facility], [name] Primary Gastroenterologist:  Dr. Gala Romney  Chief Complaint  Patient presents with  . Constipation    Taking stool softener every night, bloating     HPI:   Roberto Sanchez. is a 74 y.o. male who presents on referral from the [facility] for diarrhea and constipation.  Reviewed information provided with referral including extensive ER referral including indications for diarrhea and constipation that are alternating with notes of diarrhea is progressively worse and failed medical treatment.  Appears stool studies were completed.  When the patient presented to the [place] he had been having symptoms for 2 weeks, felt he likely had IBS.  Denied blood or melena.  Noted to be due for colonoscopy that he would discuss with the [facility] gastroenterologist.  Known history of GERD on omeprazole, known history of esophageal stricture without new concerns or complaints.  No stool study results provided with the referral.  No history of colonoscopy or endoscopy in our system.  Today he states he's doing ok overall. Diarrhea has resolved. Main complaint today is constipation and bloating. Takes a stool softener every night which helps him go. When he has a bowel movement with the stool softener, bloating improves. With daily stool softener he has 1-2 bm a day. Has sensation of incomplete emptying. His second bm is usually a return trip due to persistent need for more emptying. Has abdominal pain in his lower abdomen that feels "full and bloated" which improves after a bowel movement; no severe pain. Appetite remains good. Denies N/V, hematochezia, melena, fever, chills, unintentional weight loss. Last colonoscopy was "a while ago, about 12 years ago." He states his last colonoscopy was normal and was told to repeat in 7-10 years. He is ok with colonoscopy being repeated. He was supposed to be referred to [facility] but requested [facility] for closer to home.  Denies URI or flu-like symptoms. Denies loss of sense of taste or smell. The patient has received COVID-19 vaccination and is due for second dose in a week or two. He did have COVID-19 around Christmas 2020. Denies chest pain, dyspnea, dizziness, lightheadedness, syncope, near syncope. Denies any other upper or lower GI symptoms.  Has a history of prostate CA s/p surgery and on intermittent medication based on "my numbers."  Past Medical History:  Diagnosis Date  . Anxiety   . Arthritis   . Asthma   . GERD (gastroesophageal reflux disease)   . Hypertension   . Prostate CA ([place])   . Rupture of right triceps tendon 09/01/2015    Past Surgical History:  Procedure Laterality Date  . KNEE ARTHROSCOPY Right 1980s  . NASAL SINUS SURGERY    . PROSTATE SURGERY    . TONSILLECTOMY  age 38  . TRICEPS TENDON REPAIR Right 09/01/2015   Procedure: RIGHT TRICEPS  REPAIR;  Surgeon: Marchia Bond, MD;  Location: [place];  Service: Orthopedics;  Laterality: Right;    Current Outpatient Medications  Medication Sig Dispense Refill  . albuterol (PROAIR HFA) 108 (90 Base) MCG/ACT inhaler Inhale 1-2 puffs into the lungs as needed for wheezing or shortness of breath.     . ALPRAZolam (XANAX) 0.5 MG tablet Take 0.5 mg by mouth at bedtime. Nerves/sleep     . aspirin EC 81 MG tablet Take 81 mg by mouth at bedtime.    . Azelastine-Fluticasone (DYMISTA) 137-50 MCG/ACT SUSP Place 1 spray into the nose 2 (two) times daily.    Marland Kitchen  cetirizine (ZYRTEC) 10 MG tablet Take 10 mg by mouth daily.    . colestipol (COLESTID) 1 g tablet Take 1 g by mouth 2 (two) times daily.    . diclofenac Sodium (VOLTAREN) 1 % GEL Apply topically as needed.    . docusate sodium (STOOL SOFTENER) 100 MG capsule Take 100 mg by mouth at bedtime.    Marland Kitchen ezetimibe (ZETIA) 10 MG tablet Take 10 mg by mouth daily.    . Fluticasone-Salmeterol (ADVAIR) 250-50 MCG/DOSE AEPB Inhale 1 puff into the lungs every 12 (twelve) hours.    .  hydroxypropyl methylcellulose / hypromellose (ISOPTO TEARS / GONIOVISC) 2.5 % ophthalmic solution Place 1 drop into both eyes as needed for dry eyes. Reported on 05/07/2016    . losartan-hydrochlorothiazide (HYZAAR) 100-12.5 MG per tablet TAKE ONE TABLET BY MOUTH ONCE DAILY IN EVENING FOR BLOOD PRESSURE  11  . montelukast (SINGULAIR) 10 MG tablet Take 10 mg by mouth daily.    . Multiple Vitamin (MULTIVITAMIN WITH MINERALS) TABS tablet Take 1 tablet by mouth daily.    . niacin 500 MG tablet Take 500 mg by mouth daily.     Marland Kitchen olopatadine (PATANOL) 0.1 % ophthalmic solution as needed.     . Omega-3 Fatty Acids (FISH OIL) 1000 MG CAPS Take by mouth daily.    Marland Kitchen omeprazole (PRILOSEC) 20 MG capsule Take 20 mg by mouth daily.  3  . Peppermint Oil (IBGARD) 90 MG CPCR Take by mouth daily.     No current facility-administered medications for this visit.    Allergies as of 04/20/2020 - Review Complete 04/20/2020  Allergen Reaction Noted  . Penicillins Other (See Comments) 01/27/2012  . Shellfish allergy Swelling, Hives, and Shortness Of Breath 10/04/2011    Family History  Problem Relation Age of Onset  . Asthma Maternal Uncle   . Allergic rhinitis Brother   . Allergic rhinitis Brother   . Colon cancer Father 71  . Angioedema Neg Hx   . Eczema Neg Hx   . Urticaria Neg Hx   . Immunodeficiency Neg Hx     Social History   Socioeconomic History  . Marital status: Married    Spouse name: Not on file  . Number of children: Not on file  . Years of education: Not on file  . Highest education level: Not on file  Occupational History  . Not on file  Tobacco Use  . Smoking status: Former Smoker    Packs/day: 3.00    Years: 20.00    Pack years: 60.00    Types: Cigarettes    Quit date: 08/30/1983    Years since quitting: 36.6  . Smokeless tobacco: Former Systems developer    Types: [place] date: 08/29/1985  Substance and Sexual Activity  . Alcohol use: Yes    Comment: 3 to 5 beers weekly  . Drug  use: No  . Sexual activity: Not on file  Other Topics Concern  . Not on file  Social History Narrative  . Not on file   Social Determinants of Health   Financial Resource Strain:   . Difficulty of Paying Living Expenses:   Food Insecurity:   . Worried About Charity fundraiser in the Last Year:   . Arboriculturist in the Last Year:   Transportation Needs:   . Film/video editor (Medical):   Marland Kitchen Lack of Transportation (Non-Medical):   Physical Activity:   . Days of Exercise per Week:   .  Minutes of Exercise per Session:   Stress:   . Feeling of Stress :   Social Connections:   . Frequency of Communication with Friends and Family:   . Frequency of Social Gatherings with Friends and Family:   . Attends Religious Services:   . Active Member of Clubs or Organizations:   . Attends Archivist Meetings:   Marland Kitchen Marital Status:   Intimate Partner Violence:   . Fear of Current or Ex-Partner:   . Emotionally Abused:   Marland Kitchen Physically Abused:   . Sexually Abused:     Subjective: Review of Systems  Constitutional: Negative for chills, fever, malaise/fatigue and weight loss.  HENT: Negative for congestion and sore throat.   Respiratory: Negative for cough and shortness of breath.   Cardiovascular: Negative for chest pain and palpitations.  Gastrointestinal: Positive for abdominal pain and constipation. Negative for blood in stool, diarrhea, melena, nausea and vomiting.  Musculoskeletal: Negative for joint pain and myalgias.  Skin: Negative for rash.  Neurological: Negative for dizziness and weakness.  Endo/Heme/Allergies: Does not bruise/bleed easily.  Psychiatric/Behavioral: Negative for depression. The patient is not nervous/anxious.   All other systems reviewed and are negative.      Objective: BP 139/80   Pulse 75   Temp (!) 96.9 F (36.1 C) (Temporal)   Ht 5\' 10"  (1.778 m)   Wt 207 lb 6.4 oz (94.1 kg)   BMI 29.76 kg/m  Physical Exam Vitals and nursing note  reviewed.  Constitutional:      General: He is not in acute distress.    Appearance: Normal appearance. He is not ill-appearing, toxic-appearing or diaphoretic.  HENT:     Head: Normocephalic and atraumatic.     Nose: No congestion or rhinorrhea.  Eyes:     General: No scleral icterus. Cardiovascular:     Rate and Rhythm: Normal rate and regular rhythm.     Heart sounds: Normal heart sounds.  Pulmonary:     Effort: Pulmonary effort is normal.     Breath sounds: Normal breath sounds.  Abdominal:     General: Bowel sounds are normal. There is no distension.     Palpations: Abdomen is soft. There is no hepatomegaly, splenomegaly or mass.     Tenderness: There is no abdominal tenderness. There is no guarding or rebound.     Hernia: No hernia is present.  Musculoskeletal:     Cervical back: Neck supple.  Skin:    General: Skin is warm and dry.     Coloration: Skin is not jaundiced.     Findings: No bruising or rash.  Neurological:     General: No focal deficit present.     Mental Status: He is alert and oriented to person, place, and time. Mental status is at baseline.  Psychiatric:        Mood and Affect: Mood normal.        Behavior: Behavior normal.        Thought Content: Thought content normal.       04/20/2020 9:39 AM   Disclaimer: This note was dictated with voice recognition software. Similar sounding words can inadvertently be transcribed and may not be corrected upon review.

## 2020-04-20 NOTE — Assessment & Plan Note (Signed)
The patient notes constipation and bloating.  Typically has 2 bowel movements a day but significant incomplete emptying.  He is on a daily stool softener which has not made an improvement with his bloating or incomplete emptying.  At this point I will trial him on Linzess 72 mcg once daily.  We discussed possible side effects including diarrhea.  Will provide samples for 1 to 2 weeks and request a progress report in 1 to 2 weeks.  May need to change options depending on his clinical response to Linzess.  Follow-up in 3 months.  Call for any worsening or severe symptoms.

## 2020-04-20 NOTE — Assessment & Plan Note (Addendum)
Noted family history of colorectal cancer in his father who was diagnosed in his 49s.  His last colonoscopy was about 12 years ago and is currently overdue.  At this point it appears we have been authorized by the [facility] to do a colonoscopy but I will have medical records double check and secure any other authorizations necessary.  We will plan to tentatively complete a colonoscopy, no need for urgent completion due to no red flag/warning signs or symptoms.  Further recommendations will follow.  Follow-up in 3 months.  Proceed with TCS on propofol/MAC with Dr. Gala Romney in near future: the risks, benefits, and alternatives have been discussed with the patient in detail. The patient states understanding and desires to proceed.  The patient is currently on Xanax. The patient is not on any other anticoagulants, anxiolytics, chronic pain medications, antidepressants, antidiabetics, or iron supplements.  We will plan for the procedure on propofol/MAC to promote adequate sedation.  ASA II (well managed htn, social ETOH)

## 2020-04-25 DEATH — deceased

## 2020-04-26 ENCOUNTER — Telehealth: Payer: Self-pay | Admitting: Emergency Medicine

## 2020-04-26 NOTE — Telephone Encounter (Signed)
Pt called and stated linzess 48mcg is not working for him. Pt stated a higher dose may help... would you like me to give him samples of 171mcg?

## 2020-04-27 NOTE — Telephone Encounter (Signed)
Pt was given samples of linzess 188mcg. Notified him to call in a week or 2 to let us know if they are working

## 2020-04-28 ENCOUNTER — Telehealth: Payer: Self-pay | Admitting: Internal Medicine

## 2020-04-28 NOTE — Telephone Encounter (Signed)
I called the [facility] and his authorization will cover his colonoscopy. It is good 180 days from his first OV.

## 2020-04-28 NOTE — Telephone Encounter (Signed)
Noted  

## 2020-05-03 ENCOUNTER — Telehealth: Payer: Self-pay | Admitting: Internal Medicine

## 2020-05-03 DIAGNOSIS — K5900 Constipation, unspecified: Secondary | ICD-10-CM

## 2020-05-03 NOTE — Telephone Encounter (Signed)
Spoke with pt. Pt was seen on 04/20/20. Pt was given Linzess 72 mcg and called back to increase his dose when the Linzess 72 mcg wasn't working. Pt took samples of Linzess 145 mcg and states that he is having bloating and has diarrhea once daily in the morning after taking the Linzess 145 mcg. Pt feels he's not emptying out completely. Pt takes Omeprazole 20 mg qam for Gerd. Pt isn't having any burning sensations, just bloating with diarrhea once daily.

## 2020-05-03 NOTE — Telephone Encounter (Signed)
[phone]  PATIENT CALLED AND SAID THE LINZESS IS NOT WORKING.  HE IS STILL CONSTIPATED

## 2020-05-04 MED ORDER — LUBIPROSTONE 8 MCG PO CAPS: 8.0000 ug | ORAL_CAPSULE | Freq: Two times a day (BID) | ORAL | 3 refills | Status: DC

## 2020-05-04 NOTE — Telephone Encounter (Signed)
We can try a different medication if he would like. I would likely go with Amitiza 8 mcg bid WITH FOOD. If he is ok with this let me know and I can send in a 30 day Rx (we do not have any samples currently).  Let me know if he is ok with this.

## 2020-05-04 NOTE — Telephone Encounter (Signed)
Spoke with pt. Pt would like to try Amitiza 8 mcg bid with food. Please send in.

## 2020-05-04 NOTE — Telephone Encounter (Signed)
Rx sent 

## 2020-05-04 NOTE — Addendum Note (Signed)
Addended by: Gordy Levan, Lela Murfin A on: 05/04/2020 03:42 PM   Modules accepted: Orders

## 2020-05-04 NOTE — Telephone Encounter (Signed)
Noted. Pt is aware 

## 2020-05-08 ENCOUNTER — Telehealth: Payer: Self-pay | Admitting: Emergency Medicine

## 2020-05-08 ENCOUNTER — Telehealth: Payer: Self-pay | Admitting: Internal Medicine

## 2020-05-08 NOTE — Telephone Encounter (Signed)
Pt was seen recently by EG. Pt said he can't afford the medication. Please advise. [phone]

## 2020-05-08 NOTE — Telephone Encounter (Signed)
Spoke with pt. Pt usually gets his medication through the [place]. Pt also has a secondary insurance card [employer] ID [id number], GP [id number], provider number [phone]. Pt isn't sure if the medication wasn't covered or just expensive. I'll discuss with the pharmacy. I haven't received a PA.

## 2020-05-08 NOTE — Telephone Encounter (Signed)
Pa started on pt Key: [id number] - PA Case ID: [id number] - Rx #: [id number]

## 2020-05-09 ENCOUNTER — Other Ambulatory Visit: Payer: Self-pay | Admitting: Gastroenterology

## 2020-05-09 DIAGNOSIS — K5900 Constipation, unspecified: Secondary | ICD-10-CM

## 2020-05-09 MED ORDER — TRULANCE 3 MG PO TABS: 3.0000 mg | ORAL_TABLET | Freq: Every day | ORAL | 3 refills | Status: DC

## 2020-05-09 NOTE — Telephone Encounter (Signed)
Spoke with pt. Pt is in agreement with starting Trulance.

## 2020-05-09 NOTE — Telephone Encounter (Signed)
Rx sent to pharmacy   

## 2020-05-09 NOTE — Telephone Encounter (Signed)
If patient is agreeable, we can try Trulace 3 mg daily.

## 2020-05-09 NOTE — Telephone Encounter (Signed)
PA was completed for Amitiza 8 mcg. This medication isn't covered under pts insurance. Pt must try and fail Trulance. Pt has already tried Linzess. Pt takes Colace once daily and it isn't enough for pt to have a BM. Please advise in the absence of EG.

## 2020-05-09 NOTE — Telephone Encounter (Signed)
Noted  

## 2020-05-10 ENCOUNTER — Telehealth: Payer: Self-pay | Admitting: Emergency Medicine

## 2020-05-10 NOTE — Telephone Encounter (Signed)
Pt called and stated trulance is over $400 after insurance pt stated is a part of the [place] and I notified him that sometimes the [place] will give pts vultures to assist with paying for medications. Pt stated he would reach out to the va and ask if they are able to help him. If the va is unable to assist him to call us and let us know and that we would request a tier reduction through his insurance company . Pt stated he would call us back tomorrow or Friday and update [place]

## 2020-05-10 NOTE — Telephone Encounter (Signed)
error 

## 2020-05-11 ENCOUNTER — Telehealth: Payer: Self-pay | Admitting: Internal Medicine

## 2020-05-11 NOTE — Telephone Encounter (Signed)
Noted. Trulance RX was faxed to the [place].

## 2020-05-11 NOTE — Telephone Encounter (Signed)
See other phone note. RX has been faxed to the [place].

## 2020-05-11 NOTE — Telephone Encounter (Signed)
[facility] CALLED AND SAID PATIENT TRIED TO GET HIS PRESCRIPTIONS BUT THEY WERE VERY EXPENSIVE AT HIS PHARMACY.  PLEASE S[facility] PRES[facility] TO THE [facility] AND THE [place] IS [phone]

## 2020-05-15 ENCOUNTER — Telehealth: Payer: Self-pay | Admitting: Internal Medicine

## 2020-05-15 NOTE — Telephone Encounter (Signed)
Spoke with pt. Pt is aware that his Trulance RX was faxed Thursday 05/11/20 to the [place]. Called the [place] and they were closed. Will call tomorrow to verify that order was received.

## 2020-05-15 NOTE — Telephone Encounter (Signed)
[phone]  All patient prescriptions need to [facility]    They were supposed to send a list of his medications to [place] Friday

## 2020-05-17 NOTE — Telephone Encounter (Signed)
Left a detailed message for pt. I called the [facility] and was transferred to the pharmacy [facility]. I left a message for the VA and asked them to call back if they haven't received the RX. Left a message for pt. If pt hasn't heard back from the [place], he was asked to give a return call back.

## 2020-05-30 ENCOUNTER — Telehealth: Payer: Self-pay | Admitting: Internal Medicine

## 2020-05-30 NOTE — Telephone Encounter (Signed)
Ferdinand.  HE SAID THAT THE OFFICE WAS SUPPOSED TO SEND ORDER TO THE [facility] AND HE HAS NOT HEARD FROM THEM, ALSO CAN NOT REMEMBER WHAT MEDICATION IT WAS

## 2020-05-31 ENCOUNTER — Telehealth: Payer: Self-pay | Admitting: Internal Medicine

## 2020-05-31 NOTE — Telephone Encounter (Signed)
Patient called back and said that if you could not reach the [place] to call him and he will get them and find out what is going on .

## 2020-05-31 NOTE — Telephone Encounter (Signed)
Tried calling the [place] and wasn't able to get anyone. Pts medication was faxed over to the [place] several times. Will keep trying to reach out to the [place].

## 2020-06-05 NOTE — Telephone Encounter (Signed)
Spoke with the [place]. RX wasn't received via fax. Faxed RX Trulance 3 mg daily to [phone].

## 2020-06-05 NOTE — Telephone Encounter (Signed)
Spoke with pt. Pt is aware that RX was faxed again.

## 2020-06-06 ENCOUNTER — Telehealth: Payer: Self-pay | Admitting: *Deleted

## 2020-06-06 NOTE — Telephone Encounter (Signed)
Received a letter from the [place]. They received pts RX for Trulance. Medication isn't covered under insurance. They recommend pt try Psyllium, Miralax, Lactulose, Senakot, Sorbitol, Biscadyl. Per pt, he has only tried medication that our office we've given him. Please advise.

## 2020-06-06 NOTE — Telephone Encounter (Signed)
Lmom for pt to call us back.  Need to schedule procedure.

## 2020-06-07 ENCOUNTER — Telehealth: Payer: Self-pay | Admitting: *Deleted

## 2020-06-07 ENCOUNTER — Other Ambulatory Visit: Payer: Self-pay | Admitting: *Deleted

## 2020-06-07 ENCOUNTER — Encounter: Payer: Self-pay | Admitting: *Deleted

## 2020-06-07 MED ORDER — PEG 3350-KCL-NA BICARB-NACL 420 G PO SOLR: 4000.0000 mL | Freq: Once | ORAL | 0 refills | Status: AC

## 2020-06-07 MED ORDER — FLEET ENEMA 7-19 GM/118ML RE ENEM: 1.0000 | ENEMA | Freq: Once | RECTAL | 0 refills | Status: AC

## 2020-06-07 MED ORDER — BISACODYL EC 5 MG PO TBEC: 5.0000 mg | DELAYED_RELEASE_TABLET | Freq: Every day | ORAL | 0 refills | Status: DC | PRN

## 2020-06-07 NOTE — Telephone Encounter (Signed)
[facility] Roberto Sanchez is on file with expiration date of 09/29/2020.  VA referral number is [id number].

## 2020-06-07 NOTE — Telephone Encounter (Signed)
Spoke to pt and he scheduled his procedure for 07/14/2020.  Informed pt to arrive at 9:00 to register.  Scheduled Covid screening for 07/12/2020 at 2:30.  Pt is aware that both will be done at [facility].  RX faxed to [facility].  Pt is aware that I will mail out prep instructions and Covid screening info.  Pt voiced understanding.

## 2020-06-09 NOTE — Telephone Encounter (Signed)
Spoke with pt. Pt is aware of EG recommendations. Pt will start Metamucil 1 tsp daily for one week, then increase to tsp bid as directed. Pt will call back with a progress report.

## 2020-06-09 NOTE — Telephone Encounter (Signed)
Let's have him try adding Psyllium to his stools softener. Take Metamucil (or store brand) 1 tsp once daily for a week, then increase to a tsp twice daily. Call with progress report in 2-3 weeks.

## 2020-06-25 DEATH — deceased

## 2020-06-29 ENCOUNTER — Other Ambulatory Visit: Payer: Self-pay

## 2020-06-29 ENCOUNTER — Encounter (HOSPITAL_COMMUNITY)
Admission: RE | Admit: 2020-06-29 | Discharge: 2020-06-29 | Disposition: A | Discharge status: Home / Self Care | Payer: No Typology Code available for payment source | Source: Ambulatory Visit | Attending: Internal Medicine | Admitting: Internal Medicine

## 2020-07-12 ENCOUNTER — Other Ambulatory Visit: Payer: Self-pay

## 2020-07-12 ENCOUNTER — Other Ambulatory Visit (HOSPITAL_COMMUNITY)
Admission: RE | Admit: 2020-07-12 | Discharge: 2020-07-12 | Disposition: A | Discharge status: Home / Self Care | Payer: No Typology Code available for payment source | Source: Ambulatory Visit | Attending: Internal Medicine | Admitting: Internal Medicine

## 2020-07-12 DIAGNOSIS — Z20822 Contact with and (suspected) exposure to covid-19: Secondary | ICD-10-CM | POA: Diagnosis not present

## 2020-07-12 DIAGNOSIS — Z01812 Encounter for preprocedural laboratory examination: Secondary | ICD-10-CM | POA: Diagnosis present

## 2020-07-13 LAB — SARS CORONAVIRUS 2 (TAT 6-24 HRS): SARS Coronavirus 2: NEGATIVE

## 2020-07-14 ENCOUNTER — Encounter (HOSPITAL_COMMUNITY)
Admission: RE | Disposition: A | Discharge status: Home / Self Care | Payer: Self-pay | Source: Home / Self Care | Attending: Internal Medicine

## 2020-07-14 ENCOUNTER — Ambulatory Visit (HOSPITAL_COMMUNITY): Payer: No Typology Code available for payment source | Admitting: Anesthesiology

## 2020-07-14 ENCOUNTER — Encounter (HOSPITAL_COMMUNITY): Payer: Self-pay | Admitting: *Deleted

## 2020-07-14 ENCOUNTER — Other Ambulatory Visit: Payer: Self-pay

## 2020-07-14 ENCOUNTER — Ambulatory Visit (HOSPITAL_COMMUNITY)
Admission: RE | Admit: 2020-07-14 | Discharge: 2020-07-14 | Disposition: A | Discharge status: Home / Self Care | Payer: No Typology Code available for payment source | Attending: Internal Medicine | Admitting: Internal Medicine

## 2020-07-14 DIAGNOSIS — Z87891 Personal history of nicotine dependence: Secondary | ICD-10-CM | POA: Insufficient documentation

## 2020-07-14 DIAGNOSIS — I10 Essential (primary) hypertension: Secondary | ICD-10-CM | POA: Diagnosis not present

## 2020-07-14 DIAGNOSIS — Z8546 Personal history of malignant neoplasm of prostate: Secondary | ICD-10-CM | POA: Insufficient documentation

## 2020-07-14 DIAGNOSIS — R1030 Lower abdominal pain, unspecified: Secondary | ICD-10-CM | POA: Diagnosis present

## 2020-07-14 DIAGNOSIS — D123 Benign neoplasm of transverse colon: Secondary | ICD-10-CM | POA: Diagnosis not present

## 2020-07-14 DIAGNOSIS — D124 Benign neoplasm of descending colon: Secondary | ICD-10-CM | POA: Diagnosis not present

## 2020-07-14 DIAGNOSIS — Z79899 Other long term (current) drug therapy: Secondary | ICD-10-CM | POA: Diagnosis not present

## 2020-07-14 DIAGNOSIS — Z7982 Long term (current) use of aspirin: Secondary | ICD-10-CM | POA: Diagnosis not present

## 2020-07-14 DIAGNOSIS — J449 Chronic obstructive pulmonary disease, unspecified: Secondary | ICD-10-CM | POA: Diagnosis not present

## 2020-07-14 DIAGNOSIS — Z7951 Long term (current) use of inhaled steroids: Secondary | ICD-10-CM | POA: Diagnosis not present

## 2020-07-14 DIAGNOSIS — K219 Gastro-esophageal reflux disease without esophagitis: Secondary | ICD-10-CM | POA: Diagnosis not present

## 2020-07-14 DIAGNOSIS — K5900 Constipation, unspecified: Secondary | ICD-10-CM | POA: Insufficient documentation

## 2020-07-14 DIAGNOSIS — D120 Benign neoplasm of cecum: Secondary | ICD-10-CM | POA: Diagnosis not present

## 2020-07-14 DIAGNOSIS — M1990 Unspecified osteoarthritis, unspecified site: Secondary | ICD-10-CM | POA: Diagnosis not present

## 2020-07-14 DIAGNOSIS — F419 Anxiety disorder, unspecified: Secondary | ICD-10-CM | POA: Insufficient documentation

## 2020-07-14 HISTORY — PX: POLYPECTOMY: SHX5525

## 2020-07-14 HISTORY — PX: COLONOSCOPY WITH PROPOFOL: SHX5780

## 2020-07-14 SURGERY — COLONOSCOPY WITH PROPOFOL
Anesthesia: General

## 2020-07-14 MED ORDER — CHLORHEXIDINE GLUCONATE CLOTH 2 % EX PADS: 6.0000 | MEDICATED_PAD | Freq: Once | CUTANEOUS | Status: DC

## 2020-07-14 MED ORDER — LACTATED RINGERS IV SOLN: INTRAVENOUS | Status: DC | PRN

## 2020-07-14 MED ORDER — LACTATED RINGERS IV SOLN
Freq: Once | INTRAVENOUS | Status: AC
  Administered 2020-07-14: 1000 mL via INTRAVENOUS

## 2020-07-14 MED ORDER — PROPOFOL 500 MG/50ML IV EMUL
INTRAVENOUS | Status: DC | PRN
  Administered 2020-07-14: 100 mg via INTRAVENOUS
  Administered 2020-07-14: 150 ug/kg/min via INTRAVENOUS

## 2020-07-14 MED ORDER — STERILE WATER FOR IRRIGATION IR SOLN
Status: DC | PRN
  Administered 2020-07-14: 1.5 mL

## 2020-07-14 NOTE — Transfer of Care (Signed)
Immediate Anesthesia Transfer of Care Note  Patient: Roberto Sanchez.  Procedure(s) Performed: COLONOSCOPY WITH PROPOFOL (N/A ) POLYPECTOMY  Patient Location: Endoscopy Unit  Anesthesia Type:General  Level of Consciousness: awake, alert , oriented and patient cooperative  Airway & Oxygen Therapy: Patient Spontanous Breathing  Post-op Assessment: Report given to RN, Post -op Vital signs reviewed and stable and Patient moving all extremities  Post vital signs: Reviewed and stable  Last Vitals:  Vitals Value Taken Time  BP    Temp    Pulse 73 07/14/20 1025  Resp 16 07/14/20 1025  SpO2 99 % 07/14/20 1025  Vitals shown include unvalidated device data.  Last Pain:  Vitals:   07/14/20 0935  TempSrc:   PainSc: 2       Patients Stated Pain Goal: 6 (05/69/79 4801)  Complications: No complications documented.

## 2020-07-14 NOTE — Anesthesia Postprocedure Evaluation (Signed)
Anesthesia Post Note  Patient: Roberto Sanchez.  Procedure(s) Performed: COLONOSCOPY WITH PROPOFOL (N/A ) POLYPECTOMY  Patient location during evaluation: Endoscopy Anesthesia Type: General Level of consciousness: awake, oriented, awake and alert and patient cooperative Pain management: pain level controlled Vital Signs Assessment: post-procedure vital signs reviewed and stable Respiratory status: spontaneous breathing, respiratory function stable and nonlabored ventilation Cardiovascular status: blood pressure returned to baseline and stable Postop Assessment: no headache and no backache Anesthetic complications: no   No complications documented.   Last Vitals:  Vitals:   07/14/20 0916  BP: (!) 161/82  Pulse: 73  Resp: 17  Temp: 36.8 C  SpO2: 98%    Last Pain:  Vitals:   07/14/20 0935  TempSrc:   PainSc: 2                  Tacy Learn

## 2020-07-14 NOTE — Op Note (Signed)
[facility] Patient Name: Roberto Sanchez Procedure Date: 07/14/2020 9:35 AM MRN: [id number] Date of Birth: [birth year] Attending MD: Elon Alas. Edgar Frisk CSN: [id number] Age: 74 Admit Type: Outpatient Procedure:                Colonoscopy Indications:              Lower abdominal pain, Constipation Providers:                Elon Alas. Elius Etheredge, DO, Otis Peak B. Sharon Seller, RN,                            Caprice Kluver, Nelma Rothman, Technician Referring MD:              Medicines:                See the Anesthesia note for documentation of the                            administered medications Complications:            No immediate complications. Estimated Blood Loss:     Estimated blood loss was minimal. Procedure:                Pre-Anesthesia Assessment:                           - The anesthesia plan was to use monitored                            anesthesia care (MAC).                           After obtaining informed consent, the colonoscope                            was passed under direct vision. Throughout the                            procedure, the patient's blood pressure, pulse, and                            oxygen saturations were monitored continuously. The                            PCF-H190DL ([id number]) scope was introduced through                            the anus and advanced to the the cecum, identified                            by appendiceal orifice and ileocecal valve. The                            colonoscopy was performed without difficulty. The                            patient tolerated the  procedure well. The quality                            of the bowel preparation was evaluated using the                            BBPS [facility] Bowel Preparation Scale) with scores                            of: Right Colon = 2 (minor amount of residual                            staining, small fragments of stool and/or opaque                            liquid, but  mucosa seen well), Transverse Colon = 3                            (entire mucosa seen well with no residual staining,                            small fragments of stool or opaque liquid) and Left                            Colon = 3 (entire mucosa seen well with no residual                            staining, small fragments of stool or opaque                            liquid). The total BBPS score equals 8. The quality                            of the bowel preparation was good. Scope In: 9:38:52 AM Scope Out: 10:22:40 AM Scope Withdrawal Time: 0 hours 38 minutes 28 seconds  Total Procedure Duration: 0 hours 43 minutes 48 seconds  Findings:      The perianal and digital rectal examinations were normal.      Many small and large-mouthed diverticula were found in the sigmoid colon       and descending colon.      A 14 mm polyp was found in the cecum. The polyp was flat. Preparations       were made for mucosal resection. Orise gel was injected with adequate       lift of the lesion from the muscularis propria. Margins were well       demarcated. Snare mucosal resection was performed. A 16 mm area was       resected. Resection and retrieval were complete. Edges were then burned       with snare tip. There was no bleeding during and at the end of the       procedure. To close a defect after polypectomy, two hemostatic clips       were successfully placed (MR conditional). There was no bleeding at the  end of the procedure.      A 7 mm polyp was found in the cecum. The polyp was sessile. The polyp       was removed with a hot snare. Resection and retrieval were complete.      Two sessile polyps were found in the transverse colon. The polyps were 1       to 2 mm in size. These polyps were removed with a jumbo cold forceps.       Resection and retrieval were complete.      A 7 mm polyp was found in the descending colon. The polyp was       pedunculated. The polyp was removed with a  hot snare. Resection and       retrieval were complete. Impression:               - Diverticulosis in the sigmoid colon and in the                            descending colon.                           - One 14 mm polyp in the cecum, removed with                            mucosal resection. Resected and retrieved. Clips                            (MR conditional) were placed.                           - One 7 mm polyp in the cecum, removed with a hot                            snare. Resected and retrieved.                           - Two 1 to 2 mm polyps in the transverse colon,                            removed with a jumbo cold forceps. Resected and                            retrieved.                           - One 7 mm polyp in the descending colon, removed                            with a hot snare. Resected and retrieved.                           - Mucosal resection was performed. Resection and                            retrieval were complete. Moderate Sedation:      Per Anesthesia Care  Recommendation:           - Patient has a contact number available for                            emergencies. The signs and symptoms of potential                            delayed complications were discussed with the                            patient. Return to normal activities tomorrow.                            Written discharge instructions were provided to the                            patient.                           - Resume previous diet.                           - Continue present medications.                           - Await pathology results.                           - Repeat colonoscopy in 1 year for surveillance.                           - Return to GI clinic PRN. Procedure Code(s):        --- Professional ---                           [phone], Colonoscopy, flexible; with endoscopic                            mucosal resection                           45385, 16,  Colonoscopy, flexible; with removal of                            tumor(s), polyp(s), or other lesion(s) by snare                            technique                           45380, 63, Colonoscopy, flexible; with biopsy,                            single or multiple Diagnosis Code(s):        --- Professional ---  K63.5, Polyp of colon                           R10.30, Lower abdominal pain, unspecified                           K59.00, Constipation, unspecified                           K57.30, Diverticulosis of large intestine without                            perforation or abscess without bleeding CPT copyright 2019 American Medical Association. All rights reserved. The codes documented in this report are preliminary and upon coder review may  be revised to meet current compliance requirements. Elon Alas. Abbey Chatters, [place] Abbey Chatters, DO 07/14/2020 10:29:42 AM This report has been signed electronically. Number of Addenda: 0

## 2020-07-14 NOTE — Anesthesia Preprocedure Evaluation (Addendum)
Anesthesia Evaluation  Patient identified by MRN, date of birth, ID band Patient awake    Reviewed: Allergy & Precautions, NPO status , Patient's Chart, lab work & pertinent test results  History of Anesthesia Complications Negative for: history of anesthetic complications  Airway Mallampati: III  TM Distance: >3 FB Neck ROM: Full    Dental  (+) Dental Advisory Given, Missing   Pulmonary asthma ,  COPD inhaler, former smoker,    Pulmonary exam normal breath sounds clear to auscultation       Cardiovascular Exercise Tolerance: Good hypertension, Pt. on medications Normal cardiovascular exam Rhythm:Regular Rate:Normal     Neuro/Psych Anxiety negative neurological ROS     GI/Hepatic Neg liver ROS, GERD  Medicated,  Endo/Other  negative endocrine ROS  Renal/GU negative Renal ROS     Musculoskeletal  (+) Arthritis ,   Abdominal   Peds  Hematology negative hematology ROS (+)   Anesthesia Other Findings   Reproductive/Obstetrics                            Anesthesia Physical Anesthesia Plan  ASA: II  Anesthesia Plan: General   Post-op Pain Management:    Induction: Intravenous  PONV Risk Score and Plan:   Airway Management Planned: Nasal Cannula and Natural Airway  Additional Equipment:   Intra-op Plan:   Post-operative Plan:   Informed Consent: I have reviewed the patients History and Physical, chart, labs and discussed the procedure including the risks, benefits and alternatives for the proposed anesthesia with the patient or authorized representative who has indicated his/her understanding and acceptance.     Dental advisory given  Plan Discussed with: CRNA and Surgeon  Anesthesia Plan Comments:         Anesthesia Quick Evaluation

## 2020-07-14 NOTE — H&P (Signed)
Primary Care Physician:  Clinic, [name] Primary Gastroenterologist:  Dr. Abbey Chatters  Pre-Procedure History & Physical: HPI:  Roberto Sanchez. is a 74 y.o. male is here for a diagnostic colonoscopy for constipation and abdominal pain  Past Medical History:  Diagnosis Date  . Anxiety   . Arthritis   . Asthma   . GERD (gastroesophageal reflux disease)   . Hypertension   . Prostate CA ([place])   . Rupture of right triceps tendon 09/01/2015    Past Surgical History:  Procedure Laterality Date  . KNEE ARTHROSCOPY Right 1980s  . NASAL SINUS SURGERY    . PROSTATE SURGERY    . TONSILLECTOMY  age 35  . TRICEPS TENDON REPAIR Right 09/01/2015   Procedure: RIGHT TRICEPS  REPAIR;  Surgeon: Marchia Bond, MD;  Location: [facility];  Service: Orthopedics;  Laterality: Right;    Prior to Admission medications   Medication Sig Start Date End Date Taking? Authorizing Provider  albuterol (PROAIR HFA) 108 (90 Base) MCG/ACT inhaler Inhale 1-2 puffs into the lungs as needed for wheezing or shortness of breath.  03/16/08  Yes [provider]  ALPRAZolam (XANAX) 0.5 MG tablet Take 0.5 mg by mouth at bedtime.    Yes [provider]  aspirin EC 81 MG tablet Take 81 mg by mouth at bedtime.   Yes [provider]  calcium carbonate (OSCAL) 1500 (600 Ca) MG TABS tablet Take 600 mg of elemental calcium by mouth daily with breakfast.   Yes [provider]  colestipol (COLESTID) 1 g tablet Take 1 g by mouth 2 (two) times daily.   Yes [provider]  docusate sodium (STOOL SOFTENER) 100 MG capsule Take 100-200 mg by mouth at bedtime.    Yes [provider]  ezetimibe (ZETIA) 10 MG tablet Take 10 mg by mouth daily.   Yes [provider]  Fluticasone-Salmeterol (ADVAIR) 250-50 MCG/DOSE AEPB Inhale 1 puff into the lungs every 12 (twelve) hours.   Yes [provider]  hydrochlorothiazide (HYDRODIURIL) 25 MG tablet Take 25 mg by  mouth daily.   Yes [provider]  hydroxypropyl methylcellulose / hypromellose (ISOPTO TEARS / GONIOVISC) 2.5 % ophthalmic solution Place 1 drop into both eyes as needed for dry eyes.    Yes [provider]  losartan (COZAAR) 100 MG tablet Take 100 mg by mouth daily.   Yes [provider]  montelukast (SINGULAIR) 10 MG tablet Take 10 mg by mouth daily. 03/18/15  Yes [provider]  niacin 500 MG tablet Take 500 mg by mouth daily.    Yes [provider]  Omega-3 Fatty Acids (FISH OIL) 1000 MG CAPS Take 1,000 mg by mouth daily.    Yes [provider]  omeprazole (PRILOSEC) 20 MG capsule Take 20 mg by mouth daily. 01/27/15  Yes [provider]  Azelastine-Fluticasone (DYMISTA) 137-50 MCG/ACT SUSP Place 1 spray into the nose 2 (two) times daily. Patient not taking: Reported on 06/21/2020    [provider]  bisacodyl (BISACODYL) 5 MG EC tablet Take 1 tablet (5 mg total) by mouth daily as needed for moderate constipation. Patient not taking: Reported on 06/21/2020 06/07/20   Carlis Stable, NP  cetirizine (ZYRTEC) 10 MG tablet Take 10 mg by mouth daily. Patient not taking: Reported on 06/21/2020    [provider]  diclofenac Sodium (VOLTAREN) 1 % GEL Apply topically as needed. Patient not taking: Reported on 06/21/2020    [provider]  losartan-hydrochlorothiazide (HYZAAR) 100-12.5 MG per tablet TAKE ONE TABLET BY MOUTH ONCE DAILY IN EVENING FOR BLOOD PRESSURE Patient not taking: Reported on 06/21/2020 07/17/15   [provider]  lubiprostone (AMITIZA) 8 MCG capsule Take 1 capsule (8 mcg total) by mouth 2 (two) times daily with a meal. Patient not taking: Reported on 06/21/2020 05/04/20   Carlis Stable, NP  Multiple Vitamin (MULTIVITAMIN WITH MINERALS) TABS tablet Take 1 tablet by mouth daily. Patient not taking: Reported on 06/21/2020    [provider]  olopatadine (PATANOL) 0.1 % ophthalmic solution  as needed.  Patient not taking: Reported on 06/21/2020 12/26/16   [provider]  Peppermint Oil (IBGARD) 90 MG CPCR Take by mouth daily. Patient not taking: Reported on 06/21/2020    [provider]  Plecanatide (TRULANCE) 3 MG TABS Take 3 mg by mouth daily. Patient not taking: Reported on 06/21/2020 05/09/20   Erenest Rasher, PA-C    Allergies as of 06/07/2020 - Review Complete 04/20/2020  Allergen Reaction Noted  . Penicillins Other (See Comments) 01/27/2012  . Shellfish allergy Swelling, Hives, and Shortness Of Breath 10/04/2011    Family History  Problem Relation Age of Onset  . Asthma Maternal Uncle   . Allergic rhinitis Brother   . Allergic rhinitis Brother   . Colon cancer Father 49  . Angioedema Neg Hx   . Eczema Neg Hx   . Urticaria Neg Hx   . Immunodeficiency Neg Hx     Social History   Socioeconomic History  . Marital status: Married    Spouse name: Not on file  . Number of children: Not on file  . Years of education: Not on file  . Highest education level: Not on file  Occupational History  . Not on file  Tobacco Use  . Smoking status: Former Smoker    Packs/day: 3.00    Years: 20.00    Pack years: 60.00    Types: Cigarettes    Quit date: 08/30/1983    Years since quitting: 36.8  . Smokeless tobacco: Former Systems developer    Types: Pinch date: 08/29/1985  Vaping Use  . Vaping Use: Never used  Substance and Sexual Activity  . Alcohol use: Yes    Comment: 3 to 5 beers weekly  . Drug use: No  . Sexual activity: Not on file  Other Topics Concern  . Not on file  Social History Narrative  . Not on file   Social Determinants of Health   Financial Resource Strain:   . Difficulty of Paying Living Expenses: Not on file  Food Insecurity:   . Worried About Charity fundraiser in the Last Year: Not on file  . Ran Out of Food in the Last Year: Not on file  Transportation Needs:   . Lack of Transportation (Medical): Not on file  . Lack of  Transportation (Non-Medical): Not on file  Physical Activity:   . Days of Exercise per Week: Not on file  . Minutes of Exercise per Session: Not on file  Stress:   . Feeling of Stress : Not on file  Social Connections:   . Frequency of Communication with Friends and Family: Not on file  . Frequency of Social Gatherings with Friends and Family: Not on file  . Attends Religious Services: Not on file  . Active Member of Clubs or Organizations: Not on file  . Attends Archivist Meetings: Not on file  . Marital Status:  Not on file  Intimate Partner Violence:   . Fear of Current or Ex-Partner: Not on file  . Emotionally Abused: Not on file  . Physically Abused: Not on file  . Sexually Abused: Not on file    Review of Systems: See HPI, otherwise negative ROS  Impression/Plan: Roberto Sing. is here for a colonoscopy to be performed for constipation and abdominal pain  Risks, benefits, limitations, imponderables and alternatives regarding colonoscopy have been reviewed with the patient. Questions have been answered. All parties agreeable.

## 2020-07-14 NOTE — Discharge Instructions (Addendum)
Colonoscopy Discharge Instructions  Read the instructions outlined below and refer to this sheet in the next few weeks. These discharge instructions provide you with general information on caring for yourself after you leave the hospital. Your doctor may also give you specific instructions. While your treatment has been planned according to the most current medical practices available, unavoidable complications occasionally occur.   ACTIVITY  You may resume your regular activity, but move at a slower pace for the next 24 hours.   Take frequent rest periods for the next 24 hours.   Walking will help get rid of the air and reduce the bloated feeling in your belly (abdomen).   No driving for 24 hours (because of the medicine (anesthesia) used during the test).    Do not sign any important legal documents or operate any machinery for 24 hours (because of the anesthesia used during the test).  NUTRITION  Drink plenty of fluids.   You may resume your normal diet as instructed by your doctor.   Begin with a light meal and progress to your normal diet. Heavy or fried foods are harder to digest and may make you feel sick to your stomach (nauseated).   Avoid alcoholic beverages for 24 hours or as instructed.  MEDICATIONS  You may resume your normal medications unless your doctor tells you otherwise.  WHAT YOU CAN EXPECT TODAY  Some feelings of bloating in the abdomen.   Passage of more gas than usual.   Spotting of blood in your stool or on the toilet paper.  IF YOU HAD POLYPS REMOVED DURING THE COLONOSCOPY:  No aspirin products for 7 days or as instructed.   No alcohol for 7 days or as instructed.   Eat a soft diet for the next 24 hours.  FINDING OUT THE RESULTS OF YOUR TEST Not all test results are available during your visit. If your test results are not back during the visit, make an appointment with your caregiver to find out the results. Do not assume everything is normal if  you have not heard from your caregiver or the medical facility. It is important for you to follow up on all of your test results.  SEEK IMMEDIATE MEDICAL ATTENTION IF:  You have more than a spotting of blood in your stool.   Your belly is swollen (abdominal distention).   You are nauseated or vomiting.   You have a temperature over 101.   You have abdominal pain or discomfort that is severe or gets worse throughout the day.    Colon Polyps  Polyps are tissue growths inside the body. Polyps can grow in many places, including the large intestine (colon). A polyp may be a round bump or a mushroom-shaped growth. You could have one polyp or several. Most colon polyps are noncancerous (benign). However, some colon polyps can become cancerous over time. Finding and removing the polyps early can help prevent this. What are the causes? The exact cause of colon polyps is not known. What increases the risk? You are more likely to develop this condition if you:  Have a family history of colon cancer or colon polyps.  Are older than 21 or older than 45 if you are African American.  Have inflammatory bowel disease, such as ulcerative colitis or Crohn's disease.  Have certain hereditary conditions, such as: ? Familial adenomatous polyposis. ? Lynch syndrome. ? Turcot syndrome. ? Peutz-Jeghers syndrome.  Are overweight.  Smoke cigarettes.  Do not get enough exercise.  Drink  too much alcohol.  Eat a diet that is high in fat and red meat and low in fiber.  Had childhood cancer that was treated with abdominal radiation. What are the signs or symptoms? Most polyps do not cause symptoms. If you have symptoms, they may include:  Blood coming from your rectum when having a bowel movement.  Blood in your stool. The stool may look dark red or black.  Abdominal pain.  A change in bowel habits, such as constipation or diarrhea. How is this diagnosed? This condition is diagnosed with a  colonoscopy. This is a procedure in which a lighted, flexible scope is inserted into the anus and then passed into the colon to examine the area. Polyps are sometimes found when a colonoscopy is done as part of routine cancer screening tests. How is this treated? Treatment for this condition involves removing any polyps that are found. Most polyps can be removed during a colonoscopy. Those polyps will then be tested for cancer. Additional treatment may be needed depending on the results of testing. Follow these instructions at home: Lifestyle  Maintain a healthy weight, or lose weight if recommended by your health care provider.  Exercise every day or as told by your health care provider.  Do not use any products that contain nicotine or tobacco, such as cigarettes and e-cigarettes. If you need help quitting, ask your health care provider.  If you drink alcohol, limit how much you have: ? 0-1 drink a day for women. ? 0-2 drinks a day for men.  Be aware of how much alcohol is in your drink. In the [place] one drink equals one 12 oz bottle of beer (355 mL), one 5 oz glass of wine (148 mL), or one 1 oz shot of hard liquor (44 mL). Eating and drinking   Eat foods that are high in fiber, such as fruits, vegetables, and whole grains.  Eat foods that are high in calcium and vitamin D, such as milk, cheese, yogurt, eggs, liver, fish, and broccoli.  Limit foods that are high in fat, such as fried foods and desserts.  Limit the amount of red meat and processed meat you eat, such as hot dogs, sausage, bacon, and lunch meats. General instructions  Keep all follow-up visits as told by your health care provider. This is important. ? This includes having regularly scheduled colonoscopies. ? Talk to your health care provider about when you need a colonoscopy. Contact a health care provider if:  You have new or worsening bleeding during a bowel movement.  You have new or increased blood in your  stool.  You have a change in bowel habits.  You lose weight for no known reason. Summary  Polyps are tissue growths inside the body. Polyps can grow in many places, including the colon.  Most colon polyps are noncancerous (benign), but some can become cancerous over time.  This condition is diagnosed with a colonoscopy.  Treatment for this condition involves removing any polyps that are found. Most polyps can be removed during a colonoscopy. This information is not intended to replace advice given to you by your health care provider. Make sure you discuss any questions you have with your health care provider. Document Revised: 02/26/2018 Document Reviewed: 02/26/2018 Elsevier Patient Education  [place].   Colonoscopy showed 5 polyps which I removed successfully.  1 of these polyps was quite large in the right side of the colon.  Await pathology results.  My office will contact you  next week with these results.  Based on the size and location of the polyps, I recommend we repeat this in 1 year.  If the next colonoscopy is normal, we can stretch this out further.  Follow-up with GI as needed.  I hope you have a great rest of your week!

## 2020-07-16 NOTE — Progress Notes (Signed)
Patient's wife called after hours line.  States patient just had a bowel movement with bright red blood.  He underwent colonoscopy 2 days ago where I removed a large 14 mm polyp in the cecum with EMR and subsequent clip placement x2.  I counseled patient's wife that this could be a post polypectomy bleed.  Other differential includes diverticular or hemorrhoidal as well.  Patient denies any generalized weakness fatigue, chest pain, shortness of breath.  Does note some bloating.    I counseled patient to continue to monitor closely, if he has further episodes of rectal bleeding then he should present to our emergency department for further evaluation.  Possible he may need repeat colonoscopy to determine etiology of his bleeding if this continues.  Patient and his wife understand and will do so if he has further bleeding or other signs and symptoms.

## 2020-07-17 ENCOUNTER — Other Ambulatory Visit: Payer: Self-pay

## 2020-07-17 ENCOUNTER — Ambulatory Visit (HOSPITAL_COMMUNITY)
Admission: RE | Admit: 2020-07-17 | Discharge: 2020-07-17 | Disposition: A | Discharge status: Home / Self Care | Payer: Medicare Other | Source: Ambulatory Visit | Attending: Internal Medicine | Admitting: Internal Medicine

## 2020-07-17 ENCOUNTER — Other Ambulatory Visit: Payer: Self-pay | Admitting: *Deleted

## 2020-07-17 ENCOUNTER — Telehealth: Payer: Self-pay | Admitting: *Deleted

## 2020-07-17 DIAGNOSIS — Z800 Family history of malignant neoplasm of digestive organs: Secondary | ICD-10-CM

## 2020-07-17 DIAGNOSIS — K5900 Constipation, unspecified: Secondary | ICD-10-CM

## 2020-07-17 LAB — SURGICAL PATHOLOGY

## 2020-07-17 NOTE — Telephone Encounter (Signed)
I just called and spoke with patient in depth.  Sounds like his bleeding has slowed down with only a mild amount on his last bowel movement.  Can we order a CBC and a KUB for patient to have done today if possible.  Please call patient and give him instructions on how to do this.  I counseled patient that if he has further bleeding that he needs to go to the emergency room for further evaluation.  Thank you!

## 2020-07-17 NOTE — Telephone Encounter (Signed)
Pt called in and said that he had a procedure on Friday.  He said that he had blood in stool over the weekend and spoke to Dr. Abbey Chatters.  He said that he was told that it could be coming from his procedure.  Pt said that he saw more blood in stool today and wants me to find out from Dr. Abbey Chatters if this is still normal and when he should expect it to subside.  He said it is bright red and he is wiping a heavy amount.  He is also complaining of pain in his lower stomach.  Wants to know if this is normal side effects from procedure as well.

## 2020-07-17 NOTE — Telephone Encounter (Signed)
Spoke with pt and informed him to go to [facility] Radiology Dept to have KUB done.  He requested CBC lab work order to be sent to [place] all information given by Dr. Abbey Chatters.  Pt voiced understanding and plans on going to have tests completed today.

## 2020-07-18 ENCOUNTER — Encounter (HOSPITAL_COMMUNITY): Payer: Self-pay | Admitting: Internal Medicine

## 2020-07-18 LAB — CBC WITH DIFFERENTIAL/PLATELET
Basophils Absolute: 0.1 10*3/uL (ref 0.0–0.2)
Basos: 1 %
EOS (ABSOLUTE): 0.4 10*3/uL (ref 0.0–0.4)
Eos: 4 %
Hematocrit: 37.9 % (ref 37.5–51.0)
Hemoglobin: 13.3 g/dL (ref 13.0–17.7)
Immature Grans (Abs): 0 10*3/uL (ref 0.0–0.1)
Immature Granulocytes: 0 %
Lymphocytes Absolute: 2.3 10*3/uL (ref 0.7–3.1)
Lymphs: 26 %
MCH: 33.6 pg — ABNORMAL HIGH (ref 26.6–33.0)
MCHC: 35.1 g/dL (ref 31.5–35.7)
MCV: 96 fL (ref 79–97)
Monocytes Absolute: 0.5 10*3/uL (ref 0.1–0.9)
Monocytes: 6 %
Neutrophils Absolute: 5.5 10*3/uL (ref 1.4–7.0)
Neutrophils: 63 %
Platelets: 343 10*3/uL (ref 150–450)
RBC: 3.96 x10E6/uL — ABNORMAL LOW (ref 4.14–5.80)
RDW: 12.2 % (ref 11.6–15.4)
WBC: 8.8 10*3/uL (ref 3.4–10.8)

## 2020-07-25 ENCOUNTER — Ambulatory Visit (INDEPENDENT_AMBULATORY_CARE_PROVIDER_SITE_OTHER): Payer: No Typology Code available for payment source | Admitting: Nurse Practitioner

## 2020-07-25 ENCOUNTER — Other Ambulatory Visit: Payer: Self-pay

## 2020-07-25 ENCOUNTER — Encounter: Payer: Self-pay | Admitting: Nurse Practitioner

## 2020-07-25 VITALS — BP 148/97 | HR 65 | Temp 97.2°F | Ht 70.0 in | Wt 205.2 lb

## 2020-07-25 DIAGNOSIS — K5900 Constipation, unspecified: Secondary | ICD-10-CM | POA: Diagnosis not present

## 2020-07-25 DIAGNOSIS — K219 Gastro-esophageal reflux disease without esophagitis: Secondary | ICD-10-CM | POA: Insufficient documentation

## 2020-07-25 DIAGNOSIS — R140 Abdominal distension (gaseous): Secondary | ICD-10-CM | POA: Diagnosis not present

## 2020-07-25 NOTE — Patient Instructions (Addendum)
Your health issues we discussed today were:   GERD (reflux/heartburn): 1. I am glad your symptoms are doing well 2. Continue taking Prilosec as you have been 3. Call us for any worsening or severe symptoms  Constipation with bloating/discomfort: 1. We will send a prescription for Amitiza 24 mcg to the [place].  If they approve this and fill it, take it twice a day WITH FOOD 2. In the meantime continue taking Colace 100 mg.  Take it twice a day, in the morning and in the evening 3. Start taking MiraLAX 1 capful/1 dose once a day in the morning 4. After 1 week increase MiraLAX to twice a day (in the morning and in the evening) 5. Call us for any worsening or severe symptoms  Overall I recommend:  1. Continue your other current medications 2. Return for follow-up in 3 months 3. Call us if you have any questions or concerns   ---------------------------------------------------------------  I am glad you have gotten your COVID-19 vaccination!  Even though you are fully vaccinated you should continue to follow CDC and state/local guidelines.  ---------------------------------------------------------------   At [facility] we value your feedback. You may receive a survey about your visit today. Please share your experience as we strive to create trusting relationships with our patients to provide genuine, compassionate, quality care.  We appreciate your understanding and patience as we review any laboratory studies, imaging, and other diagnostic tests that are ordered as we care for you. Our office policy is 5 business days for review of these results, and any emergent or urgent results are addressed in a timely manner for your best interest. If you do not hear from our office in 1 week, please contact us.   We also encourage the use of MyChart, which contains your medical information for your review as well. If you are not enrolled in this feature, an access code is on this after  visit summary for your convenience. Thank you for allowing [place] to be involved in your care.  It was great to see you today!  I hope you have a great rest of your summer!!

## 2020-07-25 NOTE — Progress Notes (Signed)
Referring Provider: [name] Care Physician:  [name]:  Dr. Gala Romney  Chief Complaint  Patient presents with   Constipation    feels like stomach isn't emptying out, abd pain    HPI:   Roberto Sanchez. is a 74 y.o. male who presents for follow-up on constipation.  The patient was last seen in our office 04/20/2020 for constipation and family history of colon cancer.  [facility].  Previous ER visit including alternating constipation and diarrhea progressively worsening and failing medical management.  Stool studies were completed.  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  Known history of GERD on omeprazole.  Known history of esophageal stricture without new concerns.  At his last visit his main concern was constipation and bloating, take stool softener daily which helps him go and bloating improves with a bowel movement.  With daily stool softener has 1-2 bowel movements a day but sensation of incomplete emptying and second bowel movement of the day she is a return trip for more emptying.  Lower abdominal pain and bloating improves with a bowel movement.  Last colonoscopy about 12 years ago and was told it was normal and to repeat in 7 to 10 years.  No other overt GI complaints.  Recommended trial of Linzess 72 mcg daily, progress for 1 to 2 weeks, hold stool softener while on Linzess, colonoscopy, follow-up in 3 months.  He called our office indicating Linzess 72 mcg was not very effective so he was increased to 145 mcg of samples.  This also was not effective so he was switched to Amitiza 8 mcg twice daily with food.  This was not affordable so he was switched to Trulance.  Trulance was also pricey so a request was sent to the [place] for vouchers.  This prescription was [facility] to the [place]  [facility] and recommended trial of psyllium, MiraLAX, lactulose, Senokot, sorbitol, bisacodyl.   Recommended go back to stool softener daily and add psyllium once daily for a week then increase to twice daily.  Progress report in 2 to 3 weeks.  Colonoscopy completed 07/14/2020 which found diverticulosis in the sigmoid and descending colon, a single 14 mm polyp in the cecum status post mucosal resection and clips, a single 7 mm polyp in the cecum, two 1 to 2 mm polyps in the transverse colon, a single 7 mm polyp in the descending colon.  Recommended repeat colonoscopy in 1 year for surveillance.  The patient called our office a few days later indicating noted blood in his stool, bright red, heavy in amount.  We reached out to the patient and at that time his bleeding had slowed.  Recommended CBC and KUB.  The KUB was completed 07/17/2020 with no acute findings.  CBC was completed the same day with a normal hemoglobin at 13.3.  Results were discussed with the patient.  Today he states he is doing okay overall. He is still constipated. 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 He feels Linzess 145 mcg didn't help at all. He is currently using Colace two a day. He has not used Psyllium much. Amitiza was too expensive at the regular pharmacy and the he thinks the [place] wouldn't cover it either. Overall has tried Psyllium, MiraLAX, OTC laxatives (including Senokot). Has a bowel movement about every every 2 days, very hard stools and straining. Denies hematochezia. Has incomplete emptying and bloating/abdominal pressure.  Denies N/V, melena, fever, chills, unintentional weight loss. Denies URI or flu-like symptoms. Denies loss of sense of taste or smell. The patient has received COVID-19 vaccination(s). Denies chest pain, dyspnea, dizziness, lightheadedness, syncope, near syncope. Denies any other upper or lower GI symptoms.  GERD doing well on Prilosec.  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[place])    Rupture of  right triceps tendon 09/01/2015    Past Surgical History:  Procedure Laterality Date   COLONOSCOPY WITH PROPOFOL N/A 07/14/2020   Procedure: COLONOSCOPY WITH PROPOFOL;  Surgeon: Eloise Harman, DO;  [facility]: [facility];  Service: Endoscopy;  Laterality: N/A;  10:30   KNEE ARTHROSCOPY Right 1980s   NASAL SINUS SURGERY     POLYPECTOMY  07/14/2020   Procedure: POLYPECTOMY;  Surgeon: Eloise Harman, DO;  [facility]: [facility];  Service: Endoscopy;;   PROSTATE SURGERY     TONSILLECTOMY  age 17   TRICEPS TENDON REPAIR Right 09/01/2015   Procedure: RIGHT TRICEPS  REPAIR;  Surgeon: Marchia Bond, MD;  Location: [facility];  Service: Orthopedics;  Laterality: Right;    Current Outpatient Medications  Medication Sig Dispense Refill   albuterol (PROAIR HFA) 108 (90 Base) MCG/ACT inhaler Inhale 1-2 puffs into the lungs as needed for wheezing or shortness of breath.      ALPRAZolam (XANAX) 0.5 MG tablet Take 0.5 mg by mouth at bedtime.      aspirin EC 81 MG tablet Take 81 mg by mouth at bedtime.     calcium carbonate (OSCAL) 1500 (600 Ca) MG TABS tablet Take 600 mg of elemental calcium by mouth daily with breakfast.     docusate sodium (STOOL SOFTENER) 100 MG capsule Take 100-200 mg by mouth at bedtime.      ezetimibe (ZETIA) 10 MG tablet Take 10 mg by mouth daily.     Fluticasone-Salmeterol (ADVAIR) 250-50 MCG/DOSE AEPB Inhale 1 puff into the lungs every 12 (twelve) hours.     hydrochlorothiazide (HYDRODIURIL) 25 MG tablet Take 25 mg by mouth daily.     hydroxypropyl methylcellulose / hypromellose (ISOPTO TEARS / GONIOVISC) 2.5 % ophthalmic solution Place 1 drop into both eyes as needed for dry eyes.      losartan (COZAAR) 100 MG tablet Take 100 mg by mouth daily.     montelukast (SINGULAIR) 10 MG tablet Take 10 mg by mouth daily.     niacin 500 MG tablet Take 500 mg by mouth daily.      Omega-3 Fatty Acids (FISH OIL) 1000 MG CAPS Take 1,000 mg by mouth  daily.      omeprazole (PRILOSEC) 20 MG capsule Take 20 mg by mouth daily.  3   colestipol (COLESTID) 1 g tablet Take 1 g by mouth 2 (two) times daily. (Patient not taking: Reported on 07/25/2020)     No current facility-administered medications for this visit.    Allergies as of 07/25/2020 - Review Complete 07/25/2020  Allergen Reaction Noted   Penicillins Other (See Comments) 01/27/2012   Shellfish allergy Hives, Shortness Of Breath, and Swelling 10/04/2011    Family History  Problem Relation Age of Onset   Asthma Maternal Uncle    Allergic rhinitis Brother    Allergic rhinitis Brother    Colon cancer Father 24   Angioedema Neg Hx    Eczema Neg Hx  Urticaria Neg Hx    Immunodeficiency Neg Hx     Social History   Socioeconomic History   Marital status: Married    Spouse name: Not on file   Number of children: Not on file   Years of education: Not on file   Highest education level: Not on file  Occupational History   Not on file  Tobacco Use   Smoking status: Former Smoker    Packs/day: 3.00    Years: 20.00    Pack years: 60.00    Types: Cigarettes    Quit date: 08/30/1983    Years since quitting: 36.9   Smokeless tobacco: Former Systems developer    Types: [place] date: 08/29/1985  Vaping Use   Vaping Use: Never used  Substance and Sexual Activity   Alcohol use: Yes    Comment: 3 to 5 beers weekly   Drug use: No   Sexual activity: Not on file  Other Topics Concern   Not on file  Social History Narrative   Not on file   Social Determinants of Health   Financial Resource Strain:    Difficulty of Paying Living Expenses: Not on file  Food Insecurity:    Worried About Charity fundraiser in the Last Year: Not on file   [employer] of Food in the Last Year: Not on file  Transportation Needs:    Lack of Transportation (Medical): Not on file   Lack of Transportation (Non-Medical): Not on file  Physical Activity:    Days of Exercise per  Week: Not on file   Minutes of Exercise per Session: Not on file  Stress:    Feeling of Stress : Not on file  Social Connections:    Frequency of Communication with Friends and Family: Not on file   Frequency of Social Gatherings with Friends and Family: Not on file   Attends Religious Services: Not on file   Active Member of Clubs or Organizations: Not on file   Attends Archivist Meetings: Not on file   Marital Status: Not on file    Subjective: Review of Systems  Constitutional: Negative for chills, fever, malaise/fatigue and weight loss.  HENT: Negative for congestion and sore throat.   Respiratory: Negative for cough and shortness of breath.   Cardiovascular: Negative for chest pain and palpitations.  Gastrointestinal: Positive for abdominal pain (Lower abdomen) and constipation. Negative for blood in stool, diarrhea, heartburn, melena, nausea and vomiting.       Bloating  Musculoskeletal: Negative for joint pain and myalgias.  Skin: Negative for rash.  Neurological: Negative for dizziness and weakness.  Endo/Heme/Allergies: Does not bruise/bleed easily.  Psychiatric/Behavioral: Negative for depression. The patient is not nervous/anxious.   All other systems reviewed and are negative.    Objective: BP (!) 148/97    Pulse 65    Temp (!) 97.2 F (36.2 C) (Oral)    Ht 5\' 10"  (1.778 m)    Wt 205 lb 3.2 oz (93.1 kg)    BMI 29.44 kg/m  Physical Exam Vitals and nursing note reviewed.  Constitutional:      General: He is not in acute distress.    Appearance: Normal appearance. He is normal weight. He is not ill-appearing, toxic-appearing or diaphoretic.  HENT:     Head: Normocephalic and atraumatic.     Nose: No congestion or rhinorrhea.  Eyes:     General: No scleral icterus. Cardiovascular:     Rate and Rhythm: Normal  rate and regular rhythm.     Heart sounds: Normal heart sounds.  Pulmonary:     Effort: Pulmonary effort is normal.     Breath sounds:  Normal breath sounds.  Abdominal:     General: Bowel sounds are normal. There is no distension.     Palpations: Abdomen is soft. There is no hepatomegaly, splenomegaly or mass.     Tenderness: There is abdominal tenderness in the right lower quadrant, periumbilical area, suprapubic area and left lower quadrant. There is no guarding or rebound.     Hernia: No hernia is present.     Comments: Mild discomfort/bloating with palpation  Musculoskeletal:     Cervical back: Neck supple.  Skin:    General: Skin is warm and dry.     Coloration: Skin is not jaundiced.     Findings: No bruising or rash.  Neurological:     General: No focal deficit present.     Mental Status: He is alert and oriented to person, place, and time. Mental status is at baseline.  Psychiatric:        Mood and Affect: Mood normal.        Behavior: Behavior normal.        Thought Content: Thought content normal.      Assessment:  Very pleasant 74 year old male with chronic constipation.  We have previously tried multiple medications and I have difficulty getting these covered.  He has tried and failed psyllium, MiraLAX, over-the-counter stool softeners, Linzess had multiple doses.  Trulance was not covered by his pharmacy or the [place].  Amitiza was not covered well by the pharmacy, unsure if it has been sent to the [facility] for approval.  At this time we will try to get his Amitiza covered and provide further recommendations to help in the interim.  GERD doing well on PPI, no breakthrough symptoms.  No solid food dysphagia.  Plan: 1. Fax prescription for Amitiza 24 mcg to the [place] 2. In the meantime, Colace 100 mg twice daily (morning and evening) 3. Start MiraLAX 1 capful once daily 4. After 1 week increase MiraLAX to twice daily (morning and evening) 5. Over-the-counter laxative as needed 6. Call for any worsening or severe symptoms, especially rectal bleeding 7. Follow-up in 3 months.      07/25/2020 9:33  AM   Disclaimer: This note was dictated with voice recognition software. Similar sounding words can inadvertently be transcribed and may not be corrected upon review.

## 2020-08-07 ENCOUNTER — Telehealth: Payer: Self-pay | Admitting: Internal Medicine

## 2020-08-07 NOTE — Telephone Encounter (Signed)
Patient had colonoscopy on 07/14/2020 by Dr Abbey Chatters and he said he had clips placed. He is scheduled an MRI next Tuesday and wanted to know would that be ok. Please advise. [phone]

## 2020-08-07 NOTE — Telephone Encounter (Signed)
Routing to Eric Gill, NP 

## 2020-08-07 NOTE — Telephone Encounter (Signed)
Please tell the patient he should have an XRay to determine if the clips are still there. Whoever is ordering the MRI should be made aware of clip placement. If they have questions or want [place] to do to [facility] just let me know.

## 2020-08-07 NOTE — Telephone Encounter (Signed)
See note below

## 2020-08-08 NOTE — Telephone Encounter (Signed)
Spoke with pt. Pt was notified that he does need an Xray to determine if clips are still present. Pt is having the MRI at the [place]. [facility] going to [facility]. Pt will call back if our office needs to place the Xray order.

## 2020-10-25 ENCOUNTER — Ambulatory Visit: Payer: Non-veteran care | Admitting: Nurse Practitioner

## 2020-10-25 HISTORY — PX: KNEE ARTHROPLASTY: SHX992

## 2020-11-10 ENCOUNTER — Ambulatory Visit (HOSPITAL_COMMUNITY): Payer: Medicare Other | Attending: Orthopaedic Surgery | Admitting: Physical Therapy

## 2020-11-10 ENCOUNTER — Other Ambulatory Visit: Payer: Self-pay

## 2020-11-10 ENCOUNTER — Encounter (HOSPITAL_COMMUNITY): Payer: Self-pay | Admitting: Physical Therapy

## 2020-11-10 DIAGNOSIS — Z96652 Presence of left artificial knee joint: Secondary | ICD-10-CM | POA: Diagnosis not present

## 2020-11-10 DIAGNOSIS — M25662 Stiffness of left knee, not elsewhere classified: Secondary | ICD-10-CM | POA: Diagnosis present

## 2020-11-10 DIAGNOSIS — R262 Difficulty in walking, not elsewhere classified: Secondary | ICD-10-CM | POA: Insufficient documentation

## 2020-11-10 NOTE — Therapy (Addendum)
[facility] [address] [place], [place], [address] Phone: [phone]   Fax:  [phone]  Physical Therapy Evaluation  Patient Details  Name: Roberto [facility]. MRN: [id number] Date of Birth: 1946-06-09 Referring Provider (PT): Frazier Butt   Encounter Date: 11/10/2020   PT End of Session - 11/10/20 0815    Visit Number 1    Number of Visits 18    Date for PT Re-Evaluation 12/22/20    Authorization Type primary [facility] to send [facility] approval, secondary [employer] visit limit 30, 3 used, no auth    Authorization - Visit Number 1    Authorization - Number of Visits 27    Progress Note Due on Visit 10    PT Start Time 0751    PT Stop Time 0828    PT Time Calculation (min) 37 min           Past Medical History:  Diagnosis Date  . Anxiety   . Arthritis   . Asthma   . GERD (gastroesophageal reflux disease)   . Hypertension   . Prostate CA ([place])   . Rupture of right triceps tendon 09/01/2015    Past Surgical History:  Procedure Laterality Date  . COLONOSCOPY WITH PROPOFOL N/A 07/14/2020   Procedure: COLONOSCOPY WITH PROPOFOL;  Surgeon: Roberto Harman, DO;  [facility];  Service: Endoscopy;  Laterality: N/A;  10:30  . KNEE ARTHROSCOPY Right 1980s  . NASAL SINUS SURGERY    . POLYPECTOMY  07/14/2020   Procedure: POLYPECTOMY;  Surgeon: Roberto Harman, DO;  [facility];  Service: Endoscopy;;  . PROSTATE SURGERY    . TONSILLECTOMY  age 34  . TRICEPS TENDON REPAIR Right 09/01/2015   Procedure: RIGHT TRICEPS  REPAIR;  Surgeon: Marchia Bond, MD;  [facility]: [name];  Service: Orthopedics;  Laterality: Right;    There were no vitals filed for this visit.    Subjective Assessment - 11/10/20 0757    Subjective Patient with left TKR performed 10/17/20 and has been under home health PT services since. Presents to clinic today with chief complaint of stiffness in the morning and  continues to demonstrate difficulty with walking and reports limited activity tolerance requiring frequent rest periods with ambulation and difficulty performing mobility and ADL.  Patient reports he has been using cane for ambulation prior to surgery    Limitations Lifting;Standing;Walking;House hold activities    How long can you stand comfortably? 10-15    Currently in Pain? Yes    Pain Score 5     Pain Location Knee    Pain Orientation Left    Pain Type Acute pain              [facility] PT Assessment - 11/10/20 0001      Assessment   Medical Diagnosis L TKR    Referring Provider (PT) Frazier Butt    Onset Date/Surgical Date 10/17/20      Balance Screen   Has the patient fallen in the past 6 months No    Has the patient had a decrease in activity level because of a fear of falling?  No    Is the patient reluctant to leave their home because of a fear of falling?  No      Home Environment   Living Environment Private residence    Living Arrangements Spouse/significant other    Type of [place] to  enter    Entrance Stairs-Number of Steps 2-3    Entrance Stairs-Rails Can reach both    Home Layout One level      Observation/Other Assessments   Focus on Therapeutic Outcomes (FOTO)  39% function      ROM / Strength   AROM / PROM / Strength AROM;PROM;Strength      AROM   Right Knee Extension 0    Left Knee Extension 15   lacking   Left Knee Flexion 106      Strength   Right Knee Flexion 4/5    Right Knee Extension 4/5    Left Knee Flexion 3+/5    Left Knee Extension 3+/5      Ambulation/Gait   Ambulation/Gait Yes    Ambulation/Gait Assistance 6: Modified independent (Device/Increase time)    Ambulation Distance (Feet) 245 Feet    Assistive device Straight cane    Gait Pattern Decreased step length - left;Antalgic;Left foot flat;Left flexed knee in stance    Ambulation Surface Level    Gait velocity decreased    Gait Comments 2MWT                          Objective measurements completed on examination: See above findings.       [facility] Adult PT Treatment/Exercise - 11/10/20 0001      Exercises   Exercises Knee/Hip      Knee/Hip Exercises: Stretches   Other Knee/Hip Stretches QS 3x10, heel slides 3x10, prone knee hang x 3 min                  PT Education - 11/10/20 0827    Education Details pt education in HEP completion and LE elevation for edema mgmt    Person(s) Educated Patient    Methods Explanation;Demonstration    Comprehension Verbalized understanding;Returned demonstration            PT Short Term Goals - 11/10/20 0834      PT SHORT TERM GOAL #1   Title Patient will be independent with HEP in order to improve functional outcomes.    Baseline in development    Time 3    Period Weeks    Status New    Target Date 12/01/20      PT SHORT TERM GOAL #2   Title Patient will improve on FOTO score to meet predicted outcomes to improve functional indepdence    Baseline 39% function    Time 3    Period Weeks    Target Date 12/01/20      PT SHORT TERM GOAL #3   Title Patient will demonstrate left knee ROM lacking 5 extension and 115 flexion to improve gait mechanics    Baseline lacking 15 degrees extension, 106 flexion    Time 3    Period Weeks    Status New    Target Date 12/01/20      PT SHORT TERM GOAL #4   Title Patient will report at least 50% improvement in overall symptoms and function to demonstrate overall improved functional ability    Time 3    Period Weeks    Status New    Target Date 12/01/20             PT Long Term Goals - 11/10/20 0836      PT LONG TERM GOAL #1   Title Patient will improve functional ambulation as evidenced by 325 ft during 2MWT  Baseline 245 w/ cane    Time 6    Period Weeks    Status New    Target Date 12/22/20      PT LONG TERM GOAL #2   Title Patient will exhibit 0 degrees keft knee extension to normalize gait     Baseline lacking 15 and 106 flexion    Time 6    Period Weeks    Status New    Target Date 12/22/20                  Plan - 11/10/20 0827    Clinical Impression Statement Patient exhibits limited LLE ROM and strength with gait deviations and mobility impairments limiting activity tolerance and participation.  Patient would benefit from skilled PT services to restore LE ROM and strength and training to normalize gait pattern and improve body mechanics in order to return to PLOF and ADL/leisure participation    Personal Factors and Comorbidities Age;Comorbidity 1;Time since onset of injury/illness/exacerbation    Comorbidities hx of LLE injury    Examination-Activity Limitations Bend;Carry;Lift;Locomotion Level;Squat;Stairs;Stand;Transfers    Examination-Participation Restrictions Community Activity;Driving;Laundry;Yard Work;Shop    Stability/Clinical Decision Making Stable/Uncomplicated    Clinical Decision Making Low    Rehab Potential Good    PT Frequency 3x / week    PT Duration 6 weeks    PT Treatment/Interventions ADLs/Self Care Home Management;Aquatic Therapy;Cryotherapy;Electrical Stimulation;DME Instruction;Ultrasound;Moist Heat;Gait training;Stair training;Functional mobility training;Therapeutic activities;Therapeutic exercise;Balance training;Patient/family education;Neuromuscular re-education;Manual techniques;Manual lymph drainage;Compression bandaging;Passive range of motion;Taping;Dry needling;Spinal Manipulations;Joint Manipulations    PT Next Visit Plan continue with body mechanics and WBing exercises, and knee extension   PT Home Exercise Plan completed QS, heel slides, prone knee hang           Patient will benefit from skilled therapeutic intervention in order to improve the following deficits and impairments:  Abnormal gait,Decreased activity tolerance,Decreased balance,Decreased mobility,Decreased range of motion,Decreased safety awareness,Decreased  strength,Increased edema,Difficulty walking,Impaired perceived functional ability,Impaired flexibility,Improper body mechanics,Pain  Visit Diagnosis: Total knee replacement status, left  Difficulty in walking, not elsewhere classified  Decreased range of motion (ROM) of left knee     Problem List Patient Active Problem List   Diagnosis Date Noted  . Bloating 07/25/2020  . GERD (gastroesophageal reflux disease) 07/25/2020  . Constipation 04/20/2020  . Family history of colon cancer 04/20/2020  . Rupture of right triceps tendon 09/01/2015  . Anxiety 03/20/2015  . Asthma, chronic   . Vertigo 03/19/2015  . Essential hypertension 03/19/2015  . Adenocarcinoma of prostate ([place]) 10/04/2013  . Malignant neoplasm of prostate ([place]) 02/13/2012  . 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 Genuine stress incontinence, male 02/13/2012     9:45 AM, 11/10/20 M. Sherlyn Lees, PT, DPT Physical Therapist- [facility] Office Number: [phone]  [facility] [address] [place], [place], [address] Phone: [phone]   Fax:  [phone]  Name: [facility]. MRN: [id number] Date of Birth: [birth year]

## 2020-11-10 NOTE — Patient Instructions (Signed)
Access Code: [email or web address] URL: [email or web address] Date: 11/10/2020 Prepared by: Sherlyn Lees  Exercises Supine Quad Set - 1 x daily - 7 x weekly - 3 sets - 10 reps - 2 hold Supine Heel Slide with Strap - 1 x daily - 7 x weekly - 3 sets - 10 reps - 5 hold Prone Knee Extension Hang - 3 x daily - 7 x weekly - 5 min hold

## 2020-11-10 NOTE — Addendum Note (Signed)
Addended by: Toniann Fail on: 11/10/2020 09:52 AM   Modules accepted: Orders

## 2020-11-13 ENCOUNTER — Other Ambulatory Visit: Payer: Self-pay

## 2020-11-13 ENCOUNTER — Ambulatory Visit (HOSPITAL_COMMUNITY): Payer: Medicare Other | Admitting: Physical Therapy

## 2020-11-13 DIAGNOSIS — Z96652 Presence of left artificial knee joint: Secondary | ICD-10-CM | POA: Diagnosis not present

## 2020-11-13 DIAGNOSIS — R262 Difficulty in walking, not elsewhere classified: Secondary | ICD-10-CM

## 2020-11-13 DIAGNOSIS — M25662 Stiffness of left knee, not elsewhere classified: Secondary | ICD-10-CM

## 2020-11-13 NOTE — Therapy (Signed)
[facility] [place], [place], [address] Phone: [phone]   Fax:  [phone]  Physical Therapy Treatment  Patient Details  Name: [facility]. MRN: [id number] Date of Birth: Feb 19, 1946 Referring Provider (PT): Neyland Pettengill Butt   Encounter Date: 11/13/2020   PT End of Session - 11/13/20 1024    Visit Number 2    Number of Visits 18    Date for PT Re-Evaluation 12/22/20    Authorization Type primary [facility] to send VA approval, secondary [employer] visit limit 30, 3 used, no auth    Authorization - Visit Number 2    Authorization - Number of Visits 27    Progress Note Due on Visit 10    PT Start Time [phone]    PT Stop Time 0915    PT Time Calculation (min) 40 min           Past Medical History:  Diagnosis Date  . Anxiety   . Arthritis   . Asthma   . GERD (gastroesophageal reflux disease)   . Hypertension   . Prostate CA ([place])   . Rupture of right triceps tendon 09/01/2015    Past Surgical History:  Procedure Laterality Date  . COLONOSCOPY WITH PROPOFOL N/A 07/14/2020   Procedure: COLONOSCOPY WITH PROPOFOL;  Surgeon: Eloise Harman, DO;  [facility];  Service: Endoscopy;  Laterality: N/A;  10:30  . KNEE ARTHROSCOPY Right 1980s  . NASAL SINUS SURGERY    . POLYPECTOMY  07/14/2020   Procedure: POLYPECTOMY;  Surgeon: Eloise Harman, DO;  [facility];  Service: Endoscopy;;  . PROSTATE SURGERY    . TONSILLECTOMY  age 19  . TRICEPS TENDON REPAIR Right 09/01/2015   Procedure: RIGHT TRICEPS  REPAIR;  Surgeon: Marchia Bond, MD;  Location: [facility];  Service: Orthopedics;  Laterality: Right;    There were no vitals filed for this visit.   Subjective Assessment - 11/13/20 0917    Subjective Pt states he wakes alot at night due to knee pain.  STates his pain is currently 5/10 in his Lt knee.    Currently in Pain? Yes    Pain Score 5     Pain Location Knee     Pain Orientation Left    Pain Descriptors / Indicators Aching;Tightness    Pain Type Surgical pain                             [facility] Adult PT Treatment/Exercise - 11/13/20 0001      Knee/Hip Exercises: Stretches   Active Hamstring Stretch Right;3 reps;20 seconds    Active Hamstring Stretch Limitations standing    Knee: Self-Stretch to increase Flexion Left;10 seconds    Knee: Self-Stretch Limitations 10 reps      Knee/Hip Exercises: Aerobic   Stationary Bike seat 15 rocking 4 minutes      Knee/Hip Exercises: Standing   Knee Flexion Both;10 reps    Knee Flexion Limitations cues for correct form      Knee/Hip Exercises: Seated   Long Arc Quad Both;10 reps;Limitations    Long Arc Quad Limitations 5" holds      Manual Therapy   Manual Therapy Edema management    Manual therapy comments completed at end of session seperated from all other interventions    Edema Management to Lt distal LE and knee with elevation to reduce edema  PT Education - 11/13/20 6759    Education Details HEP, POC moving forward, goals, education on compressoin stockings    Person(s) Educated Patient    Methods Explanation;Demonstration;Tactile cues;Verbal cues    Comprehension Verbalized understanding;Returned demonstration;Verbal cues required;Tactile cues required            PT Short Term Goals - 11/13/20 0923      PT SHORT TERM GOAL #1   Title Patient will be independent with HEP in order to improve functional outcomes.    Baseline in development    Time 3    Period Weeks    Status On-going    Target Date 12/01/20      PT SHORT TERM GOAL #2   Title Patient will improve on FOTO score to meet predicted outcomes to improve functional indepdence    Baseline 39% function    Time 3    Period Weeks    Status On-going    Target Date 12/01/20      PT SHORT TERM GOAL #3   Title Patient will demonstrate left knee ROM lacking 5 extension and 115 flexion to  improve gait mechanics    Baseline lacking 15 degrees extension, 106 flexion    Time 3    Period Weeks    Status On-going    Target Date 12/01/20      PT SHORT TERM GOAL #4   Title Patient will report at least 50% improvement in overall symptoms and function to demonstrate overall improved functional ability    Time 3    Period Weeks    Status On-going    Target Date 12/01/20             PT Long Term Goals - 11/13/20 0923      PT LONG TERM GOAL #1   Title Patient will improve functional ambulation as evidenced by 325 ft during 2MWT    Baseline 245 ft w/ cane    Time 6    Period Weeks    Status On-going      PT LONG TERM GOAL #2   Title Patient will exhibit 0 degrees keft knee extension to normalize gait    Baseline lacking 15 and 106 flexion    Time 6    Period Weeks    Status On-going                 Plan - 11/13/20 1026    Clinical Impression Statement Pt returns today with reported compliance with HEP, however reports stiffness and difficulty sleeping at night.  Reveiwed goals and POC moving forward.  Began bike and standing stretches to improve ROM.  Pt with noted weakness in bil LE's when completing single leg WB in standing.  Cues for form and hold times with exercises.  Began retro massage to Lt LE to help reduce edema. Pt reported reduction of pain from 5 to 3/10 at end of session.  Recommended compression stockings and pt reported he already has these but when he puts them on they are too tight.  Educated that he must put these on first thing in the morning when the limb is the smallest so it remain compressed.  Pt verbalized understanding.  Did not take a ROM measurement this session due to time.    Personal Factors and Comorbidities Age;Comorbidity 1;Time since onset of injury/illness/exacerbation    Comorbidities hx of LLE injury    Examination-Activity Limitations Bend;Carry;Lift;Locomotion Level;Squat;Stairs;Stand;Transfers    Examination-Participation  Restrictions Community Activity;Driving;Laundry;Yard Work;Shop  Stability/Clinical Decision Making Stable/Uncomplicated    Rehab Potential Good    PT Frequency 3x / week    PT Duration 6 weeks    PT Treatment/Interventions ADLs/Self Care Home Management;Aquatic Therapy;Cryotherapy;Electrical Stimulation;DME Instruction;Ultrasound;Moist Heat;Gait training;Stair training;Functional mobility training;Therapeutic activities;Therapeutic exercise;Balance training;Patient/family education;Neuromuscular re-education;Manual techniques;Manual lymph drainage;Compression bandaging;Passive range of motion;Taping;Dry needling;Spinal Manipulations;Joint Manipulations    PT Next Visit Plan continue with body mechanics and WBing exercises as able (Rt knee is also bone on bone).  continue with manual to help reduce edema as needed.  FU on compression hose use.    PT Home Exercise Plan completed QS, heel slides, prone knee hang           Patient will benefit from skilled therapeutic intervention in order to improve the following deficits and impairments:  Abnormal gait,Decreased activity tolerance,Decreased balance,Decreased mobility,Decreased range of motion,Decreased safety awareness,Decreased strength,Increased edema,Difficulty walking,Impaired perceived functional ability,Impaired flexibility,Improper body mechanics,Pain  Visit Diagnosis: Total knee replacement status, left  Difficulty in walking, not elsewhere classified  Decreased range of motion (ROM) of left knee     Problem List Patient Active Problem List   Diagnosis Date Noted  . Bloating 07/25/2020  . GERD (gastroesophageal reflux disease) 07/25/2020  . Constipation 04/20/2020  . Family history of colon cancer 04/20/2020  . Rupture of right triceps tendon 09/01/2015  . Anxiety 03/20/2015  . Asthma, chronic   . Vertigo 03/19/2015  . Essential hypertension 03/19/2015  . Adenocarcinoma of prostate ([place]) 10/04/2013  . Malignant neoplasm  of prostate ([place]) 02/13/2012  . 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 Genuine stress incontinence, male 02/13/2012   Teena Irani, PTA/CLT [phone]  Teena Irani 11/13/2020, 10:27 AM  [facility] [address] [place], [place], [address] Phone: [phone]   Fax:  [phone]  Name: Dugan Vanhoesen. MRN: [id number] Date of Birth: 09-Sep-1946

## 2020-11-16 ENCOUNTER — Ambulatory Visit (HOSPITAL_COMMUNITY): Payer: Medicare Other | Admitting: Physical Therapy

## 2020-11-16 ENCOUNTER — Encounter (HOSPITAL_COMMUNITY): Payer: Self-pay | Admitting: Physical Therapy

## 2020-11-16 ENCOUNTER — Other Ambulatory Visit: Payer: Self-pay

## 2020-11-16 DIAGNOSIS — Z96652 Presence of left artificial knee joint: Secondary | ICD-10-CM

## 2020-11-16 DIAGNOSIS — M25662 Stiffness of left knee, not elsewhere classified: Secondary | ICD-10-CM

## 2020-11-16 DIAGNOSIS — R262 Difficulty in walking, not elsewhere classified: Secondary | ICD-10-CM

## 2020-11-16 NOTE — Therapy (Signed)
[place], [place], [address] Phone: [phone]   Fax:  [phone]  Physical Therapy Treatment  Patient Details  Name: Roberto Sanchez. MRN: [id number] Date of Birth: 12-05-1945 Referring Provider (PT): Frazier Butt   Encounter Date: 11/16/2020   PT End of Session - 11/16/20 1657    Visit Number 3    Number of Visits 18    Date for PT Re-Evaluation 12/22/20    Authorization Type primary [facility] to send VA approval, secondary [employer] visit limit 30, 3 used, no auth    Authorization - Visit Number 3    Authorization - Number of Visits 27    Progress Note Due on Visit 10    PT Start Time 1648    PT Stop Time 1730    PT Time Calculation (min) 42 min    Activity Tolerance Patient tolerated treatment well    Behavior During Therapy WFL for tasks assessed/performed           Past Medical History:  Diagnosis Date  . Anxiety   . Arthritis   . Asthma   . GERD (gastroesophageal reflux disease)   . Hypertension   . Prostate CA ([place])   . Rupture of right triceps tendon 09/01/2015    Past Surgical History:  Procedure Laterality Date  . COLONOSCOPY WITH PROPOFOL N/A 07/14/2020   Procedure: COLONOSCOPY WITH PROPOFOL;  Surgeon: Eloise Harman, DO;  [facility];  Service: Endoscopy;  Laterality: N/A;  10:30  . KNEE ARTHROSCOPY Right 1980s  . NASAL SINUS SURGERY    . POLYPECTOMY  07/14/2020   Procedure: POLYPECTOMY;  Surgeon: Eloise Harman, DO;  [facility];  Service: Endoscopy;;  . PROSTATE SURGERY    . TONSILLECTOMY  age 74  . TRICEPS TENDON REPAIR Right 09/01/2015   Procedure: RIGHT TRICEPS  REPAIR;  Surgeon: Marchia Bond, MD;  Location: [facility];  Service: Orthopedics;  Laterality: Right;    There were no vitals filed for this visit.   Subjective Assessment - 11/16/20 1658    Subjective Patient says he is doing better today. Says swelling is  down. Says his knee was hurting this morning. Always seems most stiff and painful in morning. Having trouble sleeping at night. Still taking pain meds. Hasn't been doing his exercises as much as he should.    Limitations Lifting;Standing;Walking;House hold activities    Currently in Pain? Yes    Pain Score 5     Pain Location Knee    Pain Orientation Left    Pain Descriptors / Indicators Aching;Tightness    Pain Type Surgical pain                             [facility] Adult PT Treatment/Exercise - 11/16/20 0001      Knee/Hip Exercises: Stretches   Active Hamstring Stretch 5 reps;10 seconds;Left    Active Hamstring Stretch Limitations on 12 inch box    Knee: Self-Stretch to increase Flexion Left;5 reps;10 seconds    Knee: Self-Stretch Limitations on 12 inch box    Gastroc Stretch Both;3 reps;30 seconds    Gastroc Stretch Limitations slant board      Knee/Hip Exercises: Aerobic   Stationary Bike Seat 13 full revolution 5 min EOS      Knee/Hip Exercises: Standing   Heel Raises Both;2 sets;10 reps    Gait Training 226 feet with  SPC. adjusted for height      Knee/Hip Exercises: Seated   Sit to Sand 1 set;10 reps;without UE support      Knee/Hip Exercises: Supine   Quad Sets Left;10 reps    Quad Sets Limitations 5" hold    Heel Slides Left;10 reps    Heel Slides Limitations 5" hold with strap    Bridges 2 sets;10 reps    Straight Leg Raises Left;1 set;10 reps    Knee Extension AROM;Left    Knee Extension Limitations 7    Knee Flexion Left;AROM    Knee Flexion Limitations 112                    PT Short Term Goals - 11/13/20 2229      PT SHORT TERM GOAL #1   Title Patient will be independent with HEP in order to improve functional outcomes.    Baseline in development    Time 3    Period Weeks    Status On-going    Target Date 12/01/20      PT SHORT TERM GOAL #2   Title Patient will improve on FOTO score to meet predicted outcomes to improve  functional indepdence    Baseline 39% function    Time 3    Period Weeks    Status On-going    Target Date 12/01/20      PT SHORT TERM GOAL #3   Title Patient will demonstrate left knee ROM lacking 5 extension and 115 flexion to improve gait mechanics    Baseline lacking 15 degrees extension, 106 flexion    Time 3    Period Weeks    Status On-going    Target Date 12/01/20      PT SHORT TERM GOAL #4   Title Patient will report at least 50% improvement in overall symptoms and function to demonstrate overall improved functional ability    Time 3    Period Weeks    Status On-going    Target Date 12/01/20             PT Long Term Goals - 11/13/20 0923      PT LONG TERM GOAL #1   Title Patient will improve functional ambulation as evidenced by 325 ft during 2MWT    Baseline 245 ft w/ cane    Time 6    Period Weeks    Status On-going      PT LONG TERM GOAL #2   Title Patient will exhibit 0 degrees keft knee extension to normalize gait    Baseline lacking 15 and 106 flexion    Time 6    Period Weeks    Status On-going                 Plan - 11/16/20 1750    Clinical Impression Statement Patient tolerated session well today. Added bridging and SLR to table exercise for LE strength progressions. Patient progressed to standing LE strengthening with added heel raise and sit to stand. Adjusted SPC for height and performed gait training for sequencing and improved functional mobility. Patient demoes decreased trunk lean with cane adjustment. Patient shows improving knee AROM, but still limited by moderate pain. Patient educated on proper form and function of all added exercise today. Issued updated HEP handout. Patient will continue to benefit form skilled therapy services to progress knee strength and mobility to reduce pain and improve LOF with ADLs.    Personal Factors and Comorbidities Age;Comorbidity 1;Time  since onset of injury/illness/exacerbation    Comorbidities hx  of LLE injury    Examination-Activity Limitations Bend;Carry;Lift;Locomotion Level;Squat;Stairs;Stand;Transfers    Examination-Participation Restrictions Community Activity;Driving;Laundry;Yard Work;Shop    Stability/Clinical Decision Making Stable/Uncomplicated    Rehab Potential Good    PT Frequency 3x / week    PT Duration 6 weeks    PT Treatment/Interventions ADLs/Self Care Home Management;Aquatic Therapy;Cryotherapy;Electrical Stimulation;DME Instruction;Ultrasound;Moist Heat;Gait training;Stair training;Functional mobility training;Therapeutic activities;Therapeutic exercise;Balance training;Patient/family education;Neuromuscular re-education;Manual techniques;Manual lymph drainage;Compression bandaging;Passive range of motion;Taping;Dry needling;Spinal Manipulations;Joint Manipulations    PT Next Visit Plan continue with body mechanics and WBing exercises as able (Rt knee is also bone on bone).  continue with manual to help reduce edema as needed.  FU on compression hose use.    PT Home Exercise Plan completed QS, heel slides, prone knee hang 11/16/20: bridge, SLR, seated HS stretch, heel raise    Consulted and Agree with Plan of Care Patient           Patient will benefit from skilled therapeutic intervention in order to improve the following deficits and impairments:  Abnormal gait,Decreased activity tolerance,Decreased balance,Decreased mobility,Decreased range of motion,Decreased safety awareness,Decreased strength,Increased edema,Difficulty walking,Impaired perceived functional ability,Impaired flexibility,Improper body mechanics,Pain  Visit Diagnosis: Total knee replacement status, left  Difficulty in walking, not elsewhere classified  Decreased range of motion (ROM) of left knee     Problem List Patient Active Problem List   Diagnosis Date Noted  . Bloating 07/25/2020  . GERD (gastroesophageal reflux disease) 07/25/2020  . Constipation 04/20/2020  . Family history of  colon cancer 04/20/2020  . Rupture of right triceps tendon 09/01/2015  . Anxiety 03/20/2015  . Asthma, chronic   . Vertigo 03/19/2015  . Essential hypertension 03/19/2015  . Adenocarcinoma of prostate ([place]) 10/04/2013  . Malignant neoplasm of prostate ([place]) 02/13/2012  . 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 Genuine stress incontinence, male 02/13/2012   5:52 PM, 11/16/20 Josue Hector PT DPT  Physical Therapist with [facility]  [phone] [place] [address] [place], [place], [address] Phone: [phone]   Fax:  [phone]  Name: Roberto Sanchez. MRN: [id number] Date of Birth: [birth year]

## 2020-11-16 NOTE — Patient Instructions (Signed)
Access Code: [email or web address] URL: [email or web address] Date: 11/16/2020 Prepared by: Josue Hector  Exercises Standing Heel Raise with Support - 3 x daily - 7 x weekly - 2 sets - 10 reps Seated Hamstring Stretch - 3 x daily - 7 x weekly - 1 sets - 5 reps - 10 seconds hold Active Straight Leg Raise with Quad Set - 3 x daily - 7 x weekly - 2 sets - 10 reps Supine Bridge - 3 x daily - 7 x weekly - 2 sets - 10 reps

## 2020-11-17 ENCOUNTER — Encounter: Payer: Self-pay | Admitting: Emergency Medicine

## 2020-11-17 ENCOUNTER — Ambulatory Visit
Admission: EM | Admit: 2020-11-17 | Discharge: 2020-11-17 | Disposition: A | Discharge status: Home / Self Care | Payer: Medicare Other | Attending: Family Medicine | Admitting: Family Medicine

## 2020-11-17 DIAGNOSIS — J0140 Acute pansinusitis, unspecified: Secondary | ICD-10-CM

## 2020-11-17 DIAGNOSIS — R0981 Nasal congestion: Secondary | ICD-10-CM | POA: Diagnosis not present

## 2020-11-17 DIAGNOSIS — J3489 Other specified disorders of nose and nasal sinuses: Secondary | ICD-10-CM | POA: Diagnosis not present

## 2020-11-17 MED ORDER — CEFUROXIME AXETIL 500 MG PO TABS: 500.0000 mg | ORAL_TABLET | Freq: Two times a day (BID) | ORAL | 0 refills | Status: AC

## 2020-11-17 NOTE — ED Triage Notes (Signed)
Sinus congestion and headache for past 2 days.  Pt states he had covid about a year ago.  States he thinks its a sinus infection , he has one every year.

## 2020-11-17 NOTE — Discharge Instructions (Addendum)
I have sent in Ceftin for you to take twice a day for 7 days for your sinus infection  Follow up with this office or with primary care if symptoms are persisting.  Follow up in the ER for high fever, trouble swallowing, trouble breathing, other concerning symptoms.

## 2020-11-17 NOTE — ED Provider Notes (Signed)
[facility]   [id number] 11/17/20 Arrival Time: 4268  TM:HDQQ THROAT  SUBJECTIVE: History from: patient.  Roberto Sanchez. is a 74 y.o. male who presents with abrupt onset of nasal congestion, headache, fatigue for the last week. Reports worsening congestion and sinus pain over the last 2 days.  Denies sick exposure to Covid, strep, flu or mono, or precipitating event. Has positive history of Covid. Has had Covid vaccines. Has tried mucinex without relief.  There are no aggravating symptoms. Denies previous symptoms in the past.     Denies fever, chills, ear pain, rhinorrhea,  SOB, wheezing, chest pain, nausea, rash, changes in bowel or bladder habits.    ROS: As per HPI.  All other pertinent ROS negative.     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[place])    Rupture of right triceps tendon 09/01/2015   Past Surgical History:  Procedure Laterality Date   COLONOSCOPY WITH PROPOFOL N/A 07/14/2020   Procedure: COLONOSCOPY WITH PROPOFOL;  Surgeon: Eloise Harman, DO;  [facility];  Service: Endoscopy;  Laterality: N/A;  10:30   KNEE ARTHROSCOPY Right 1980s   NASAL SINUS SURGERY     POLYPECTOMY  07/14/2020   Procedure: POLYPECTOMY;  Surgeon: Eloise Harman, DO;  [facility];  Service: Endoscopy;;   PROSTATE SURGERY     TONSILLECTOMY  age 24   TRICEPS TENDON REPAIR Right 09/01/2015   Procedure: RIGHT TRICEPS  REPAIR;  Surgeon: Marchia Bond, MD;  Location: [facility];  Service: Orthopedics;  Laterality: Right;   Allergies  Allergen Reactions   Penicillins Other (See Comments)    Causes patient to "pass out"DID THE REACTION INVOLVE: Swelling of the face/tongue/throat, SOB, or low BP? No Sudden or severe rash/hives, skin peeling, or the inside of the mouth or nose? No Did it require medical treatment? No When  did it last happen? If all above answers are NO, may proceed with cephalosporin use.    Shellfish Allergy Hives, Shortness Of Breath and Swelling    Facial Swelling  ears turn red   No current facility-administered medications on file prior to encounter.   Current Outpatient Medications on File Prior to Encounter  Medication Sig Dispense Refill   albuterol (PROAIR HFA) 108 (90 Base) MCG/ACT inhaler Inhale 1-2 puffs into the lungs as needed for wheezing or shortness of breath.      ALPRAZolam (XANAX) 0.5 MG tablet Take 0.5 mg by mouth at bedtime.      aspirin EC 81 MG tablet Take 81 mg by mouth at bedtime.     calcium carbonate (OSCAL) 1500 (600 Ca) MG TABS tablet Take 600 mg of elemental calcium by mouth daily with breakfast.     docusate sodium (STOOL SOFTENER) 100 MG capsule Take 100-200 mg by mouth at bedtime.      ezetimibe (ZETIA) 10 MG tablet Take 10 mg by mouth daily.     Fluticasone-Salmeterol (ADVAIR) 250-50 MCG/DOSE AEPB Inhale 1 puff into the lungs every 12 (twelve) hours.     hydrochlorothiazide (HYDRODIURIL) 25 MG tablet Take 25 mg by mouth daily.     hydroxypropyl methylcellulose / hypromellose (ISOPTO TEARS / GONIOVISC) 2.5 % ophthalmic solution Place 1 drop into both eyes as needed for dry eyes.      losartan (COZAAR) 100 MG tablet Take 100  mg by mouth daily.     montelukast (SINGULAIR) 10 MG tablet Take 10 mg by mouth daily.     niacin 500 MG tablet Take 500 mg by mouth daily.      Omega-3 Fatty Acids (FISH OIL) 1000 MG CAPS Take 1,000 mg by mouth daily.      omeprazole (PRILOSEC) 20 MG capsule Take 20 mg by mouth daily.  3   Social History   Socioeconomic History   Marital status: Married    Spouse name: Not on file   Number of children: Not on file   Years of education: Not on file   Highest education level: Not on file  Occupational History   Not on file  Tobacco Use   Smoking status: Former Smoker    Packs/day: 3.00    Years:  20.00    Pack years: 60.00    Types: Cigarettes    Quit date: 08/30/1983    Years since quitting: 37.2   Smokeless tobacco: Former Neurosurgeon    Types: Chew    Quit date: 08/29/1985  Vaping Use   Vaping Use: Never used  Substance and Sexual Activity   Alcohol use: Yes    Comment: 3 to 5 beers weekly   Drug use: No   Sexual activity: Not on file  Other Topics Concern   Not on file  Social History Narrative   Not on file   Social Determinants of Health   Financial Resource Strain: Not on file  Food Insecurity: Not on file  Transportation Needs: Not on file  Physical Activity: Not on file  Stress: Not on file  Social Connections: Not on file  Intimate Partner Violence: Not on file   Family History  Problem Relation Age of Onset   Asthma Maternal Uncle    Allergic rhinitis Brother    Allergic rhinitis Brother    Colon cancer Father 50   Angioedema Neg Hx    Eczema Neg Hx    Urticaria Neg Hx    Immunodeficiency Neg Hx     OBJECTIVE:  Vitals:   11/17/20 1252 11/17/20 1253  BP:  (!) 143/80  Pulse:  81  Resp:  17  Temp:  98.9 F (37.2 C)  TempSrc:  Oral  SpO2:  98%  Weight: 195 lb (88.5 kg)   Height: 5\' 10"  (1.778 m)      General appearance: alert; appears fatigued, but nontoxic, speaking in full sentences and managing own secretions HEENT: NCAT; Ears: EACs clear, TMs pearly gray with visible cone of light, without erythema; Eyes: PERRL, EOMI grossly; Nose: no obvious rhinorrhea; Throat: oropharynx mildly erythematous, cobblestoning present, tonsils 1+ and mildly erythematous without white tonsillar exudates, uvula midline; Sinuses: frontal and maxillary sinuses tender to palpation Neck: supple with LAD Lungs: CTA bilaterally without adventitious breath sounds; mild cough present Heart: regular rate and rhythm.  Radial pulses 2+ symmetrical bilaterally Skin: warm and dry Psychological: alert and cooperative; normal mood and affect  LABS: No results  found for this or any previous visit (from the past 24 hour(s)).   ASSESSMENT & PLAN:  1. Acute non-recurrent pansinusitis   2. Nasal congestion   3. Sinus pain     Meds ordered this encounter  Medications   cefUROXime (CEFTIN) 500 MG tablet    Sig: Take 1 tablet (500 mg total) by mouth 2 (two) times daily with a meal for 7 days.    Dispense:  14 tablet    Refill:  0  Order Specific Question:   Supervising Provider    Answer:   Chase Picket [[id number]]    Acute Sinusitis Push fluids and get rest Prescribed cefuroxime BID x 7 days Take as directed and to completion.  Declines Covid and flu testing today Drink warm or cool liquids, use throat lozenges, or popsicles to help alleviate symptoms Take OTC ibuprofen or tylenol as needed for pain May use Zyrtec D and flonase to help alleviate symptoms Follow up with PCP if symptoms persist Return or go to ER if you have any new or worsening symptoms such as fever, chills, nausea, vomiting, worsening sore throat, cough, abdominal pain, chest pain, changes in bowel or bladder habits.   Reviewed expectations re: course of current medical issues. Questions answered. Outlined signs and symptoms indicating need for more acute intervention. Patient verbalized understanding. After Visit Summary given.          Faustino Congress, NP 11/20/20 [phone]

## 2020-11-20 ENCOUNTER — Ambulatory Visit (HOSPITAL_COMMUNITY): Payer: Medicare Other

## 2020-11-20 ENCOUNTER — Telehealth (HOSPITAL_COMMUNITY): Payer: Self-pay

## 2020-11-20 NOTE — Telephone Encounter (Signed)
He is sick today and will not be here - plans to come in on Wed

## 2020-11-22 ENCOUNTER — Other Ambulatory Visit: Payer: Self-pay

## 2020-11-22 ENCOUNTER — Encounter (HOSPITAL_COMMUNITY): Payer: Self-pay

## 2020-11-22 ENCOUNTER — Ambulatory Visit (HOSPITAL_COMMUNITY): Payer: Medicare Other

## 2020-11-22 DIAGNOSIS — M25662 Stiffness of left knee, not elsewhere classified: Secondary | ICD-10-CM

## 2020-11-22 DIAGNOSIS — R262 Difficulty in walking, not elsewhere classified: Secondary | ICD-10-CM

## 2020-11-22 DIAGNOSIS — Z96652 Presence of left artificial knee joint: Secondary | ICD-10-CM | POA: Diagnosis not present

## 2020-11-22 NOTE — Therapy (Signed)
[facility] [address] [place], [place], [address] Phone: [phone]   Fax:  [phone]  Physical Therapy Treatment  Patient Details  Name: Roberto Sanchez. MRN: [id number] Date of Birth: 04/19/1946 Referring Provider (PT): Elbert Ewings   Encounter Date: 11/22/2020   PT End of Session - 11/22/20 1434    Visit Number 4    Number of Visits 18    Date for PT Re-Evaluation 12/22/20    Authorization Type primary [facility] to send VA approval, secondary [employer] visit limit 30, 3 used, no auth    Authorization - Visit Number 4    Authorization - Number of Visits 27    Progress Note Due on Visit 10    PT Start Time 1430    PT Stop Time 1515    PT Time Calculation (min) 45 min    Activity Tolerance Patient tolerated treatment well    Behavior During Therapy WFL for tasks assessed/performed           Past Medical History:  Diagnosis Date  . Anxiety   . Arthritis   . Asthma   . GERD (gastroesophageal reflux disease)   . Hypertension   . Prostate CA (HCC)   . Rupture of right triceps tendon 09/01/2015    Past Surgical History:  Procedure Laterality Date  . COLONOSCOPY WITH PROPOFOL N/A 07/14/2020   Procedure: COLONOSCOPY WITH PROPOFOL;  Surgeon: Lanelle Bal, DO;  [facility];  Service: Endoscopy;  Laterality: N/A;  10:30  . KNEE ARTHROSCOPY Right 1980s  . NASAL SINUS SURGERY    . POLYPECTOMY  07/14/2020   Procedure: POLYPECTOMY;  Surgeon: Lanelle Bal, DO;  [facility];  Service: Endoscopy;;  . PROSTATE SURGERY    . TONSILLECTOMY  age 74  . TRICEPS TENDON REPAIR Right 09/01/2015   Procedure: RIGHT TRICEPS  REPAIR;  Surgeon: Teryl Lucy, MD;  Location: [facility];  Service: Orthopedics;  Laterality: Right;    There were no vitals filed for this visit.   Subjective Assessment - 11/22/20 1431    Subjective Patient reports knee has been feeling stiff today but he has  been completing HEP at home.  Reports he can only tolerate 5 minutes of prone knee hang    Limitations Lifting;Standing;Walking;House hold activities    How long can you stand comfortably? 10-15    Currently in Pain? No/denies   stiff/sore more than pain   Pain Score 0-No pain              [facility] PT Assessment - 11/22/20 0001      Assessment   Medical Diagnosis L TKR    Referring Provider (PT) Elbert Ewings    Onset Date/Surgical Date 10/17/20                         [facility] Adult PT Treatment/Exercise - 11/22/20 0001      Knee/Hip Exercises: Aerobic   Stationary Bike Seat 13 full revolution 5 min EOS      Knee/Hip Exercises: Standing   Knee Flexion Strengthening;Left;3 sets;10 reps   10 lbs     Knee/Hip Exercises: Seated   Long Arc Quad Left;Strengthening;Weights   20 lbs 5x5, 10 lbs 3x5     Knee/Hip Exercises: Supine   Knee Extension AROM;Left;20 reps   with bolster under heel for terminal knee extension     Manual Therapy   Manual Therapy Joint  mobilization    Manual therapy comments Completed separate from all other interventions    Joint Mobilization Anterior and posterior grade III translations to improve terminal knee extension with limb positioned with foam bolster to place knee in mid-range.                  PT Education - 11/22/20 1453    Education Details educated on HEP additions and positioning techniques to facilitate terminal knee extension LLE    Person(s) Educated Patient    Methods Explanation    Comprehension Verbalized understanding            PT Short Term Goals - 11/13/20 [id number]      PT SHORT TERM GOAL #1   Title Patient will be independent with HEP in order to improve functional outcomes.    Baseline in development    Time 3    Period Weeks    Status On-going    Target Date 12/01/20      PT SHORT TERM GOAL #2   Title Patient will improve on FOTO score to meet predicted outcomes to improve functional  indepdence    Baseline 39% function    Time 3    Period Weeks    Status On-going    Target Date 12/01/20      PT SHORT TERM GOAL #3   Title Patient will demonstrate left knee ROM lacking 5 extension and 115 flexion to improve gait mechanics    Baseline lacking 15 degrees extension, 106 flexion    Time 3    Period Weeks    Status On-going    Target Date 12/01/20      PT SHORT TERM GOAL #4   Title Patient will report at least 50% improvement in overall symptoms and function to demonstrate overall improved functional ability    Time 3    Period Weeks    Status On-going    Target Date 12/01/20             PT Long Term Goals - 11/13/20 0923      PT LONG TERM GOAL #1   Title Patient will improve functional ambulation as evidenced by 325 ft during 2MWT    Baseline 245 ft w/ cane    Time 6    Period Weeks    Status On-going      PT LONG TERM GOAL #2   Title Patient will exhibit 0 degrees keft knee extension to normalize gait    Baseline lacking 15 and 106 flexion    Time 6    Period Weeks    Status On-going                 Plan - 11/22/20 1453    Clinical Impression Statement Patient tolerating tx sessions well and increased resistance with PRE without pain, but requiring cues for sequence and proper form due to weakness.  Continues to lack terminal left knee extension with appreciable joint mobility noted during manual with increase in knee extension with passive stretch/mobilization.  Continue with ROM and PRE to progress for fitness-facility based program for self-managment    Personal Factors and Comorbidities Age;Comorbidity 1;Time since onset of injury/illness/exacerbation    Comorbidities hx of LLE injury    Examination-Activity Limitations Bend;Carry;Lift;Locomotion Level;Squat;Stairs;Stand;Transfers    Examination-Participation Restrictions Community Activity;Driving;Laundry;Yard Work;Shop    Stability/Clinical Decision Making Stable/Uncomplicated    Rehab  Potential Good    PT Frequency 3x / week    PT Duration 6 weeks  PT Treatment/Interventions ADLs/Self Care Home Management;Aquatic Therapy;Cryotherapy;Electrical Stimulation;DME Instruction;Ultrasound;Moist Heat;Gait training;Stair training;Functional mobility training;Therapeutic activities;Therapeutic exercise;Balance training;Patient/family education;Neuromuscular re-education;Manual techniques;Manual lymph drainage;Compression bandaging;Passive range of motion;Taping;Dry needling;Spinal Manipulations;Joint Manipulations    PT Next Visit Plan continue with body mechanics and WBing exercises as able (Rt knee is also bone on bone).  continue with manual to help reduce edema as needed.  FU on compression hose use.    PT Home Exercise Plan completed QS, heel slides, prone knee hang 11/16/20: bridge, SLR, seated HS stretch, heel raise    Consulted and Agree with Plan of Care Patient           Patient will benefit from skilled therapeutic intervention in order to improve the following deficits and impairments:  Abnormal gait,Decreased activity tolerance,Decreased balance,Decreased mobility,Decreased range of motion,Decreased safety awareness,Decreased strength,Increased edema,Difficulty walking,Impaired perceived functional ability,Impaired flexibility,Improper body mechanics,Pain  Visit Diagnosis: Total knee replacement status, left  Difficulty in walking, not elsewhere classified  Decreased range of motion (ROM) of left knee     Problem List Patient Active Problem List   Diagnosis Date Noted  . Bloating 07/25/2020  . GERD (gastroesophageal reflux disease) 07/25/2020  . Constipation 04/20/2020  . Family history of colon cancer 04/20/2020  . Rupture of right triceps tendon 09/01/2015  . Anxiety 03/20/2015  . Asthma, chronic   . Vertigo 03/19/2015  . Essential hypertension 03/19/2015  . Adenocarcinoma of prostate ([place]) 10/04/2013  . Malignant neoplasm of prostate ([place]) 02/13/2012   . 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 Genuine stress incontinence, male 02/13/2012    3:20 PM, 11/22/20 M. Sherlyn Lees, PT, DPT Physical Therapist- [place] Office Number: [phone]  [facility] [address] [place], [place], [address] Phone: [phone]   Fax:  [phone]  Name: Xavius Sangha. MRN: [id number] Date of Birth: 1945/11/29

## 2020-11-23 ENCOUNTER — Ambulatory Visit (HOSPITAL_COMMUNITY): Payer: Medicare Other

## 2020-11-23 DIAGNOSIS — Z96652 Presence of left artificial knee joint: Secondary | ICD-10-CM

## 2020-11-23 DIAGNOSIS — R262 Difficulty in walking, not elsewhere classified: Secondary | ICD-10-CM

## 2020-11-23 DIAGNOSIS — M25662 Stiffness of left knee, not elsewhere classified: Secondary | ICD-10-CM

## 2020-11-23 NOTE — Therapy (Signed)
[facility] [address] [place], [place], [address] Phone: [phone]   Fax:  [phone]  Physical Therapy Treatment  Patient Details  Name: Roberto Sanchez. MRN: [id number] Date of Birth: 1946/11/22 Referring Provider (PT): Elbert Ewings   Encounter Date: 11/23/2020   PT End of Session - 11/23/20 1440    Visit Number 5    Number of Visits 18    Date for PT Re-Evaluation 12/22/20    Authorization Type primary [facility] to send VA approval, secondary [employer] visit limit 30, 3 used, no auth    Authorization - Visit Number 5    Authorization - Number of Visits 27    Progress Note Due on Visit 10    PT Start Time 1430    PT Stop Time 1510    PT Time Calculation (min) 40 min    Activity Tolerance Patient tolerated treatment well    Behavior During Therapy WFL for tasks assessed/performed           Past Medical History:  Diagnosis Date  . Anxiety   . Arthritis   . Asthma   . GERD (gastroesophageal reflux disease)   . Hypertension   . Prostate CA (HCC)   . Rupture of right triceps tendon 09/01/2015    Past Surgical History:  Procedure Laterality Date  . COLONOSCOPY WITH PROPOFOL N/A 07/14/2020   Procedure: COLONOSCOPY WITH PROPOFOL;  Surgeon: Lanelle Bal, DO;  [facility];  Service: Endoscopy;  Laterality: N/A;  10:30  . KNEE ARTHROSCOPY Right 1980s  . NASAL SINUS SURGERY    . POLYPECTOMY  07/14/2020   Procedure: POLYPECTOMY;  Surgeon: Lanelle Bal, DO;  [facility];  Service: Endoscopy;;  . PROSTATE SURGERY    . TONSILLECTOMY  age 70  . TRICEPS TENDON REPAIR Right 09/01/2015   Procedure: RIGHT TRICEPS  REPAIR;  Surgeon: Teryl Lucy, MD;  Location: [facility];  Service: Orthopedics;  Laterality: Right;    There were no vitals filed for this visit.   Subjective Assessment - 11/23/20 1444    Subjective Patient reports soreness along anterior thigh which he  attributes to yesterday's PT session with strong focus on open chain strengthening but no other complaints and reports his knee feels more sore than painful    Limitations Lifting;Standing;Walking;House hold activities    How long can you stand comfortably? 10-15    Currently in Pain? No/denies    Pain Score 0-No pain    Pain Location Knee    Pain Orientation Left              [facility] PT Assessment - 11/23/20 0001      Assessment   Medical Diagnosis L TKR    Referring Provider (PT) Elbert Ewings    Onset Date/Surgical Date 10/17/20                         [facility] Adult PT Treatment/Exercise - 11/23/20 0001      Knee/Hip Exercises: Stretches   Active Hamstring Stretch Left   3x10 with 2 sec hold in supine   Quad Stretch 4 reps;60 seconds;Left   prone     Knee/Hip Exercises: Aerobic   Stationary Bike Seat 13 full revolution 5 min EOS      Knee/Hip Exercises: Standing   Hip Flexion 3 sets;10 reps;Stengthening;Left   red t-loop for resistance   Other Standing Knee Exercises side stepping with  red t-loop x 2 min      Knee/Hip Exercises: Seated   Other Seated Knee/Hip Exercises seated knee flexion with red t-loop 3x10                  PT Education - 11/23/20 1458    Education Details Pt educated in HEP additions and stretching techniuqes to improve carryover    Person(s) Educated Patient    Methods Explanation    Comprehension Verbalized understanding;Returned demonstration            PT Short Term Goals - 11/13/20 0923      PT SHORT TERM GOAL #1   Title Patient will be independent with HEP in order to improve functional outcomes.    Baseline in development    Time 3    Period Weeks    Status On-going    Target Date 12/01/20      PT SHORT TERM GOAL #2   Title Patient will improve on FOTO score to meet predicted outcomes to improve functional indepdence    Baseline 39% function    Time 3    Period Weeks    Status On-going    Target  Date 12/01/20      PT SHORT TERM GOAL #3   Title Patient will demonstrate left knee ROM lacking 5 extension and 115 flexion to improve gait mechanics    Baseline lacking 15 degrees extension, 106 flexion    Time 3    Period Weeks    Status On-going    Target Date 12/01/20      PT SHORT TERM GOAL #4   Title Patient will report at least 50% improvement in overall symptoms and function to demonstrate overall improved functional ability    Time 3    Period Weeks    Status On-going    Target Date 12/01/20             PT Long Term Goals - 11/13/20 0923      PT LONG TERM GOAL #1   Title Patient will improve functional ambulation as evidenced by 325 ft during 2MWT    Baseline 245 ft w/ cane    Time 6    Period Weeks    Status On-going      PT LONG TERM GOAL #2   Title Patient will exhibit 0 degrees keft knee extension to normalize gait    Baseline lacking 15 and 106 flexion    Time 6    Period Weeks    Status On-going                 Plan - 11/23/20 1459    Clinical Impression Statement Patient reports muscle soreness in quadriceps today but no increase in joint pain/discomfort.  Tolerating tx sessions well and able to achieve 90 degrees flexion in prone position manifesting improved ROM.  Patient motivated to participate and improve ambulation to dsicontinue use of AD if possible.  Continued services required to improve strength, knee ROM, and facilitate normalized gait pattern.    Personal Factors and Comorbidities Age;Comorbidity 1;Time since onset of injury/illness/exacerbation    Comorbidities hx of LLE injury    Examination-Activity Limitations Bend;Carry;Lift;Locomotion Level;Squat;Stairs;Stand;Transfers    Examination-Participation Restrictions Community Activity;Driving;Laundry;Yard Work;Shop    Stability/Clinical Decision Making Stable/Uncomplicated    Rehab Potential Good    PT Frequency 3x / week    PT Duration 6 weeks    PT Treatment/Interventions  ADLs/Self Care Home Management;Aquatic Therapy;Cryotherapy;Electrical Stimulation;DME Instruction;Ultrasound;Moist Heat;Gait training;Stair training;Functional  mobility training;Therapeutic activities;Therapeutic exercise;Balance training;Patient/family education;Neuromuscular re-education;Manual techniques;Manual lymph drainage;Compression bandaging;Passive range of motion;Taping;Dry needling;Spinal Manipulations;Joint Manipulations    PT Next Visit Plan continue with body mechanics and WBing exercises as able (Rt knee is also bone on bone).  continue with manual to help reduce edema as needed.  FU on compression hose use.    PT Home Exercise Plan completed QS, heel slides, prone knee hang 11/16/20: bridge, SLR, seated HS stretch, heel raise. With red t-loop: Side stepping, standing hip flexion, hamstring curls, prone quad stretch    Consulted and Agree with Plan of Care Patient           Patient will benefit from skilled therapeutic intervention in order to improve the following deficits and impairments:  Abnormal gait,Decreased activity tolerance,Decreased balance,Decreased mobility,Decreased range of motion,Decreased safety awareness,Decreased strength,Increased edema,Difficulty walking,Impaired perceived functional ability,Impaired flexibility,Improper body mechanics,Pain  Visit Diagnosis: Total knee replacement status, left  Difficulty in walking, not elsewhere classified  Decreased range of motion (ROM) of left knee     Problem List Patient Active Problem List   Diagnosis Date Noted  . Bloating 07/25/2020  . GERD (gastroesophageal reflux disease) 07/25/2020  . Constipation 04/20/2020  . Family history of colon cancer 04/20/2020  . Rupture of right triceps tendon 09/01/2015  . Anxiety 03/20/2015  . Asthma, chronic   . Vertigo 03/19/2015  . Essential hypertension 03/19/2015  . Adenocarcinoma of prostate ([place]) 10/04/2013  . Malignant neoplasm of prostate ([place]) 02/13/2012  . ED  (erectile dysfunction) of organic origin 02/13/2012  . Genuine stress incontinence, male 02/13/2012   3:12 PM, 11/23/20 M. Sherlyn Lees, PT, DPT Physical Therapist- [place] Office Number: [phone]  [facility] [address] [place], [place], [address] Phone: [phone]   Fax:  [phone]  Name: Roberto Sanchez. MRN: [id number] Date of Birth: 1946/06/10

## 2020-11-23 NOTE — Patient Instructions (Signed)
Access Code: [email or web address] URL: [email or web address] Date: 11/23/2020 Prepared by: Shary Decamp  Exercises Side Stepping with Resistance at Ankles - 2 x daily - 7 x weekly - 3 sets Supine Hip Flexion with Resistance Loop - 1 x daily - 7 x weekly - 3 sets - 10 reps Seated Knee Flexion with Anchored Resistance - 1 x daily - 7 x weekly - 3 sets - 10 reps Prone Quadriceps Stretch with Strap - 1 x daily - 7 x weekly - 3 sets - 3 reps - 60 hold

## 2020-11-27 ENCOUNTER — Ambulatory Visit (HOSPITAL_COMMUNITY): Payer: Medicare Other

## 2020-11-27 ENCOUNTER — Telehealth (HOSPITAL_COMMUNITY): Payer: Self-pay

## 2020-11-27 NOTE — Telephone Encounter (Signed)
pt cancelled because his son passed away

## 2020-11-29 ENCOUNTER — Encounter (HOSPITAL_COMMUNITY): Payer: No Typology Code available for payment source | Admitting: Physical Therapy

## 2020-11-30 ENCOUNTER — Encounter (HOSPITAL_COMMUNITY): Payer: No Typology Code available for payment source

## 2020-12-01 ENCOUNTER — Encounter (HOSPITAL_COMMUNITY): Payer: Self-pay

## 2020-12-01 ENCOUNTER — Ambulatory Visit (HOSPITAL_COMMUNITY): Payer: Medicare Other | Attending: Orthopaedic Surgery

## 2020-12-01 ENCOUNTER — Other Ambulatory Visit: Payer: Self-pay

## 2020-12-01 DIAGNOSIS — R262 Difficulty in walking, not elsewhere classified: Secondary | ICD-10-CM | POA: Insufficient documentation

## 2020-12-01 DIAGNOSIS — Z96652 Presence of left artificial knee joint: Secondary | ICD-10-CM | POA: Insufficient documentation

## 2020-12-01 DIAGNOSIS — M25662 Stiffness of left knee, not elsewhere classified: Secondary | ICD-10-CM | POA: Diagnosis present

## 2020-12-01 NOTE — Patient Instructions (Signed)
Access Code: [email or web address] URL: [email or web address] Date: 12/01/2020 Prepared by: Sherlyn Lees  Exercises Long Sitting Calf Stretch with Strap - 1 x daily - 7 x weekly - 3 sets - 3 reps - 60 sec hold Single Leg Stance - 1 x daily - 7 x weekly - 3 sets - 5 reps - 10 sec hold

## 2020-12-01 NOTE — Therapy (Signed)
[place], [place], [address] Phone: [phone]   Fax:  [phone]  Physical Therapy Treatment  Patient Details  Name: Roberto Sanchez. MRN: [id number] Date of Birth: 07-Dec-1945 Referring Provider (PT): Frazier Butt   Encounter Date: 12/01/2020   PT End of Session - 12/01/20 1513    Visit Number 6    Number of Visits 18    Date for PT Re-Evaluation 12/22/20    Authorization Type primary [facility] to send VA approval, secondary [employer] visit limit 30, 3 used, no auth    Authorization - Visit Number 6    Authorization - Number of Visits 27    Progress Note Due on Visit 10    PT Start Time 1514    PT Stop Time 1600    PT Time Calculation (min) 46 min    Activity Tolerance Patient tolerated treatment well    Behavior During Therapy WFL for tasks assessed/performed           Past Medical History:  Diagnosis Date  . Anxiety   . Arthritis   . Asthma   . GERD (gastroesophageal reflux disease)   . Hypertension   . Prostate CA ([place])   . Rupture of right triceps tendon 09/01/2015    Past Surgical History:  Procedure Laterality Date  . COLONOSCOPY WITH PROPOFOL N/A 07/14/2020   Procedure: COLONOSCOPY WITH PROPOFOL;  Surgeon: Eloise Harman, DO;  [facility];  Service: Endoscopy;  Laterality: N/A;  10:30  . KNEE ARTHROSCOPY Right 1980s  . NASAL SINUS SURGERY    . POLYPECTOMY  07/14/2020   Procedure: POLYPECTOMY;  Surgeon: Eloise Harman, DO;  [facility];  Service: Endoscopy;;  . PROSTATE SURGERY    . TONSILLECTOMY  age 40  . TRICEPS TENDON REPAIR Right 09/01/2015   Procedure: RIGHT TRICEPS  REPAIR;  Surgeon: Marchia Bond, MD;  Location: [facility];  Service: Orthopedics;  Laterality: Right;    There were no vitals filed for this visit.   Subjective Assessment - 12/01/20 1517    Subjective Patient reports mainly focusing on stretching for his HEP and  has been able to walk without cane more frequently and has noticed less of a limp when ambulating.  Pt denies pain and rates chief complaint of "stiffness"    Limitations Lifting;Standing;Walking;House hold activities    How long can you stand comfortably? 10-15    Currently in Pain? No/denies    Pain Score 0-No pain              [facility] PT Assessment - 12/01/20 0001      Assessment   Medical Diagnosis L TKR    Referring Provider (PT) Frazier Butt    Onset Date/Surgical Date 10/17/20                         [facility] Adult PT Treatment/Exercise - 12/01/20 0001      Exercises   Exercises Ankle      Knee/Hip Exercises: Aerobic   Stationary Bike Seat 13 full revolution 5 min EOS      Knee/Hip Exercises: Standing   Knee Flexion Strengthening;Left;2 sets;15 reps   5 lbs   Functional Squat 2 sets;10 reps   8 lbs goblet squat: sit-stand   SLS 3x10 sec right/left    Other Standing Knee Exercises side stepping with 5 lbs ankle weights x 2 min  Other Standing Knee Exercises stair taps with 5 lbs x 2 min      Knee/Hip Exercises: Seated   Long Arc Quad Strengthening;Both;2 sets;15 reps    Long Arc Quad Weight 5 lbs.      Ankle Exercises: Stretches   Gastroc Stretch 2 reps;60 seconds   with towel   Slant Board Stretch 2 reps;60 seconds                  PT Education - 12/01/20 1531    Education Details Pt education in continuation of HEP with emphasis on body weight PRE to improve functional performance    Person(s) Educated Patient    Methods Explanation    Comprehension Verbalized understanding;Returned demonstration            PT Short Term Goals - 11/13/20 7829      PT SHORT TERM GOAL #1   Title Patient will be independent with HEP in order to improve functional outcomes.    Baseline in development    Time 3    Period Weeks    Status On-going    Target Date 12/01/20      PT SHORT TERM GOAL #2   Title Patient will improve on FOTO score  to meet predicted outcomes to improve functional indepdence    Baseline 39% function    Time 3    Period Weeks    Status On-going    Target Date 12/01/20      PT SHORT TERM GOAL #3   Title Patient will demonstrate left knee ROM lacking 5 extension and 115 flexion to improve gait mechanics    Baseline lacking 15 degrees extension, 106 flexion    Time 3    Period Weeks    Status On-going    Target Date 12/01/20      PT SHORT TERM GOAL #4   Title Patient will report at least 50% improvement in overall symptoms and function to demonstrate overall improved functional ability    Time 3    Period Weeks    Status On-going    Target Date 12/01/20             PT Long Term Goals - 11/13/20 0923      PT LONG TERM GOAL #1   Title Patient will improve functional ambulation as evidenced by 325 ft during 2MWT    Baseline 245 ft w/ cane    Time 6    Period Weeks    Status On-going      PT LONG TERM GOAL #2   Title Patient will exhibit 0 degrees keft knee extension to normalize gait    Baseline lacking 15 and 106 flexion    Time 6    Period Weeks    Status On-going                 Plan - 12/01/20 1518    Clinical Impression Statement Patient continues to demo improved performance and able to walk with decrease in antalgia noted and improved LLE weight acceptance with gait cycle and progressing well with PRE activities to increase strength. Patient able to tolerate increased activities with decreased need for rest periods.    Personal Factors and Comorbidities Age;Comorbidity 1;Time since onset of injury/illness/exacerbation    Comorbidities hx of LLE injury    Examination-Activity Limitations Bend;Carry;Lift;Locomotion Level;Squat;Stairs;Stand;Transfers    Examination-Participation Restrictions Community Activity;Driving;Laundry;Yard Work;Shop    Stability/Clinical Decision Making Stable/Uncomplicated    Rehab Potential Good    PT  Frequency 3x / week    PT Duration 6 weeks     PT Treatment/Interventions ADLs/Self Care Home Management;Aquatic Therapy;Cryotherapy;Electrical Stimulation;DME Instruction;Ultrasound;Moist Heat;Gait training;Stair training;Functional mobility training;Therapeutic activities;Therapeutic exercise;Balance training;Patient/family education;Neuromuscular re-education;Manual techniques;Manual lymph drainage;Compression bandaging;Passive range of motion;Taping;Dry needling;Spinal Manipulations;Joint Manipulations    PT Next Visit Plan continue with body mechanics and WBing exercises as able (Rt knee is also bone on bone).  continue with manual to help reduce edema as needed.  FU on compression hose use.    PT Home Exercise Plan completed QS, heel slides, prone knee hang 11/16/20: bridge, SLR, seated HS stretch, heel raise. With red t-loop: Side stepping, standing hip flexion, hamstring curls, prone quad stretch    Consulted and Agree with Plan of Care Patient           Patient will benefit from skilled therapeutic intervention in order to improve the following deficits and impairments:  Abnormal gait,Decreased activity tolerance,Decreased balance,Decreased mobility,Decreased range of motion,Decreased safety awareness,Decreased strength,Increased edema,Difficulty walking,Impaired perceived functional ability,Impaired flexibility,Improper body mechanics,Pain  Visit Diagnosis: Total knee replacement status, left  Difficulty in walking, not elsewhere classified  Decreased range of motion (ROM) of left knee     Problem List Patient Active Problem List   Diagnosis Date Noted  . Bloating 07/25/2020  . GERD (gastroesophageal reflux disease) 07/25/2020  . Constipation 04/20/2020  . Family history of colon cancer 04/20/2020  . Rupture of right triceps tendon 09/01/2015  . Anxiety 03/20/2015  . Asthma, chronic   . Vertigo 03/19/2015  . Essential hypertension 03/19/2015  . Adenocarcinoma of prostate ([place]) 10/04/2013  . Malignant neoplasm of  prostate ([place]) 02/13/2012  . 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 Genuine stress incontinence, male 02/13/2012     3:59 PM, 12/01/20 M. Sherlyn Lees, PT, DPT Physical Therapist- [place] Office Number: [phone]  [facility] [address] [place], [place], [address] Phone: [phone]   Fax:  [phone]  Name: Roberto Sanchez. MRN: [id number] Date of Birth: 30-Jun-1946

## 2020-12-04 ENCOUNTER — Other Ambulatory Visit: Payer: Self-pay

## 2020-12-04 ENCOUNTER — Ambulatory Visit (HOSPITAL_COMMUNITY): Payer: Medicare Other

## 2020-12-04 ENCOUNTER — Encounter (HOSPITAL_COMMUNITY): Payer: Self-pay

## 2020-12-04 DIAGNOSIS — Z96652 Presence of left artificial knee joint: Secondary | ICD-10-CM

## 2020-12-04 DIAGNOSIS — M25662 Stiffness of left knee, not elsewhere classified: Secondary | ICD-10-CM

## 2020-12-04 DIAGNOSIS — R262 Difficulty in walking, not elsewhere classified: Secondary | ICD-10-CM

## 2020-12-04 NOTE — Therapy (Signed)
[place], [place], [address] Phone: [phone]   Fax:  [phone]  Physical Therapy Treatment  Patient Details  Name: Roberto Sanchez. MRN: [id number] Date of Birth: [birth year] Referring Provider (PT): Frazier Butt   Encounter Date: 12/04/2020   PT End of Session - 12/04/20 1439    Visit Number 7    Number of Visits 18    Date for PT Re-Evaluation 12/22/20    Authorization Type primary [facility] to send VA approval, secondary [employer] visit limit 30, 3 used, no auth    Authorization - Visit Number 7    Authorization - Number of Visits 27    Progress Note Due on Visit 10    PT Start Time 1432    PT Stop Time 1514    PT Time Calculation (min) 42 min    Activity Tolerance Patient tolerated treatment well    Behavior During Therapy WFL for tasks assessed/performed           Past Medical History:  Diagnosis Date  . Anxiety   . Arthritis   . Asthma   . GERD (gastroesophageal reflux disease)   . Hypertension   . Prostate CA ([place])   . Rupture of right triceps tendon 09/01/2015    Past Surgical History:  Procedure Laterality Date  . COLONOSCOPY WITH PROPOFOL N/A 07/14/2020   Procedure: COLONOSCOPY WITH PROPOFOL;  Surgeon: Eloise Harman, DO;  [facility];  Service: Endoscopy;  Laterality: N/A;  10:30  . KNEE ARTHROSCOPY Right 1980s  . NASAL SINUS SURGERY    . POLYPECTOMY  07/14/2020   Procedure: POLYPECTOMY;  Surgeon: Eloise Harman, DO;  [facility];  Service: Endoscopy;;  . PROSTATE SURGERY    . TONSILLECTOMY  age 39  . TRICEPS TENDON REPAIR Right 09/01/2015   Procedure: RIGHT TRICEPS  REPAIR;  Surgeon: Marchia Bond, MD;  Location: [facility];  Service: Orthopedics;  Laterality: Right;    There were no vitals filed for this visit.   Subjective Assessment - 12/04/20 1438    Subjective Patient reports improved ambulation as he is able to walk  household distances without dyfunction or need for AD.  PAtient reports he has now joined [employer] and has been able to initiate cycling exercise    Limitations Lifting;Standing;Walking;House hold activities    How long can you stand comfortably? 10-15    Currently in Pain? No/denies    Pain Score 0-No pain              [facility] PT Assessment - 12/04/20 0001      Assessment   Medical Diagnosis L TKR    Referring Provider (PT) Frazier Butt    Onset Date/Surgical Date 10/17/20    Next MD Visit 12/06/20                         [facility] Adult PT Treatment/Exercise - 12/04/20 0001      Knee/Hip Exercises: Aerobic   Stationary Bike Seat 13 full revolution 5 min EOS      Knee/Hip Exercises: Machines for Strengthening   Cybex Knee Extension 3x10 1 plate      Knee/Hip Exercises: Standing   Functional Squat 2 sets;10 reps   front squats 8 lbs dumbells, deadlift 8 lbs dumbells, goblet squats 8 lbs   Other Standing Knee Exercises standing hamstring curls with blue loop 3x10, hip flexion with blue  loop 2x10    Other Standing Knee Exercises side stepping with blue loop 2x2 min      Ankle Exercises: Stretches   Slant Board Stretch 2 reps;60 seconds                  PT Education - 12/04/20 1439    Education Details Pt education in gym machine use to improve knee strength    Person(s) Educated Patient    Methods Explanation    Comprehension Verbalized understanding;Returned demonstration            PT Short Term Goals - 11/13/20 0102      PT SHORT TERM GOAL #1   Title Patient will be independent with HEP in order to improve functional outcomes.    Baseline in development    Time 3    Period Weeks    Status On-going    Target Date 12/01/20      PT SHORT TERM GOAL #2   Title Patient will improve on FOTO score to meet predicted outcomes to improve functional indepdence    Baseline 39% function    Time 3    Period Weeks    Status On-going    Target  Date 12/01/20      PT SHORT TERM GOAL #3   Title Patient will demonstrate left knee ROM lacking 5 extension and 115 flexion to improve gait mechanics    Baseline lacking 15 degrees extension, 106 flexion    Time 3    Period Weeks    Status On-going    Target Date 12/01/20      PT SHORT TERM GOAL #4   Title Patient will report at least 50% improvement in overall symptoms and function to demonstrate overall improved functional ability    Time 3    Period Weeks    Status On-going    Target Date 12/01/20             PT Long Term Goals - 11/13/20 0923      PT LONG TERM GOAL #1   Title Patient will improve functional ambulation as evidenced by 325 ft during 2MWT    Baseline 245 ft w/ cane    Time 6    Period Weeks    Status On-going      PT LONG TERM GOAL #2   Title Patient will exhibit 0 degrees keft knee extension to normalize gait    Baseline lacking 15 and 106 flexion    Time 6    Period Weeks    Status On-going                 Plan - 12/04/20 1453    Clinical Impression Statement Patient presents to therapy with improved functional status and is able to ambulate on level surfaces without AD and minimal gait dysfunction appreciated.  Patient reports chief complaint of knee stiffness. Patient demonstrating improved functional strength and progressing well with routine for body weight support standing PRE to replicate in gym environment for long-term carryover    Personal Factors and Comorbidities Age;Comorbidity 1;Time since onset of injury/illness/exacerbation    Comorbidities hx of LLE injury    Examination-Activity Limitations Bend;Carry;Lift;Locomotion Level;Squat;Stairs;Stand;Transfers    Examination-Participation Restrictions Community Activity;Driving;Laundry;Yard Work;Shop    Stability/Clinical Decision Making Stable/Uncomplicated    Rehab Potential Good    PT Frequency 3x / week    PT Duration 6 weeks    PT Treatment/Interventions ADLs/Self Care Home  Management;Aquatic Therapy;Cryotherapy;Electrical Stimulation;DME Instruction;Ultrasound;Moist Heat;Gait training;Stair training;Functional  mobility training;Therapeutic activities;Therapeutic exercise;Balance training;Patient/family education;Neuromuscular re-education;Manual techniques;Manual lymph drainage;Compression bandaging;Passive range of motion;Taping;Dry needling;Spinal Manipulations;Joint Manipulations    PT Next Visit Plan continue with body mechanics and WBing exercises as able (Rt knee is also bone on bone).  continue with manual to help reduce edema as needed.  FU on compression hose use.    PT Home Exercise Plan completed QS, heel slides, prone knee hang 11/16/20: bridge, SLR, seated HS stretch, heel raise. With red t-loop: Side stepping, standing hip flexion, hamstring curls, prone quad stretch    Consulted and Agree with Plan of Care Patient           Patient will benefit from skilled therapeutic intervention in order to improve the following deficits and impairments:  Abnormal gait,Decreased activity tolerance,Decreased balance,Decreased mobility,Decreased range of motion,Decreased safety awareness,Decreased strength,Increased edema,Difficulty walking,Impaired perceived functional ability,Impaired flexibility,Improper body mechanics,Pain  Visit Diagnosis: Total knee replacement status, left  Difficulty in walking, not elsewhere classified  Decreased range of motion (ROM) of left knee     Problem List Patient Active Problem List   Diagnosis Date Noted  . Bloating 07/25/2020  . GERD (gastroesophageal reflux disease) 07/25/2020  . Constipation 04/20/2020  . Family history of colon cancer 04/20/2020  . Rupture of right triceps tendon 09/01/2015  . Anxiety 03/20/2015  . Asthma, chronic   . Vertigo 03/19/2015  . Essential hypertension 03/19/2015  . Adenocarcinoma of prostate ([place]) 10/04/2013  . Malignant neoplasm of prostate ([place]) 02/13/2012  . ED (erectile  dysfunction) of organic origin 02/13/2012  . Genuine stress incontinence, male 02/13/2012    3:13 PM, 12/04/20 M. Sherlyn Lees, PT, DPT Physical Therapist- [place] Office Number: [phone]  [facility] [address] [place], [place], [address] Phone: [phone]   Fax:  [phone]  Name: Bachir Mable. MRN: [id number] Date of Birth: 05-05-1946

## 2020-12-04 NOTE — Patient Instructions (Signed)
Access Code: [email or web address] URL: [email or web address] Date: 12/04/2020 Prepared by: Sherlyn Lees  Exercises Goblet Squat with Kettlebell - 1 x daily - 7 x weekly - 3 sets - 10 reps Standing Hamstring Curl with Resistance - 1 x daily - 7 x weekly - 3 sets - 10 reps - 2 hold Supine Hip Flexion with Resistance Loop - 1 x daily - 7 x weekly - 3 sets - 10 reps Side Stepping with Resistance at Ankles - 1 x daily - 7 x weekly

## 2020-12-06 ENCOUNTER — Other Ambulatory Visit: Payer: Self-pay

## 2020-12-06 ENCOUNTER — Encounter (HOSPITAL_COMMUNITY): Payer: Self-pay

## 2020-12-06 ENCOUNTER — Ambulatory Visit (HOSPITAL_COMMUNITY): Payer: Medicare Other

## 2020-12-06 DIAGNOSIS — R262 Difficulty in walking, not elsewhere classified: Secondary | ICD-10-CM

## 2020-12-06 DIAGNOSIS — Z96652 Presence of left artificial knee joint: Secondary | ICD-10-CM | POA: Diagnosis not present

## 2020-12-06 DIAGNOSIS — M25662 Stiffness of left knee, not elsewhere classified: Secondary | ICD-10-CM

## 2020-12-06 NOTE — Therapy (Signed)
[facility] [address] [place], [place], [address] Phone: [phone]   Fax:  [phone]  Physical Therapy Treatment and Progress Note  Patient Details  Name: Roberto Sanchez. MRN: [id number] Date of Birth: [birth year] Referring Provider (PT): Frazier Butt  Progress Note Reporting Period 11/10/20 to 12/06/20  See note below for Objective Data and Assessment of Progress/Goals.      Encounter Date: 12/06/2020   PT End of Session - 12/06/20 0948    Visit Number 8    Number of Visits 18    Date for PT Re-Evaluation 12/22/20    Authorization Type primary [facility] to send VA approval, secondary [employer] visit limit 30, 3 used, no auth    Authorization - Visit Number 8    Authorization - Number of Visits 27    Progress Note Due on Visit 18    PT Start Time 0945    PT Stop Time 1024    PT Time Calculation (min) 39 min    Activity Tolerance Patient tolerated treatment well    Behavior During Therapy WFL for tasks assessed/performed           Past Medical History:  Diagnosis Date  . Anxiety   . Arthritis   . Asthma   . GERD (gastroesophageal reflux disease)   . Hypertension   . Prostate CA ([place])   . Rupture of right triceps tendon 09/01/2015    Past Surgical History:  Procedure Laterality Date  . COLONOSCOPY WITH PROPOFOL N/A 07/14/2020   Procedure: COLONOSCOPY WITH PROPOFOL;  Surgeon: Eloise Harman, DO;  [facility];  Service: Endoscopy;  Laterality: N/A;  10:30  . KNEE ARTHROSCOPY Right 1980s  . NASAL SINUS SURGERY    . POLYPECTOMY  07/14/2020   Procedure: POLYPECTOMY;  Surgeon: Eloise Harman, DO;  [facility];  Service: Endoscopy;;  . PROSTATE SURGERY    . TONSILLECTOMY  age 75  . TRICEPS TENDON REPAIR Right 09/01/2015   Procedure: RIGHT TRICEPS  REPAIR;  Surgeon: Marchia Bond, MD;  Location: [facility];  Service: Orthopedics;  Laterality: Right;    There were  no vitals filed for this visit.   Subjective Assessment - 12/06/20 0947    Subjective Patient reports overall improved status and has ortho MD f/u today regarding left knee    Limitations Lifting;Standing;Walking;House hold activities    How long can you stand comfortably? 10-15    Currently in Pain? No/denies    Pain Score 0-No pain              [facility] PT Assessment - 12/06/20 0001      Assessment   Medical Diagnosis L TKR    Referring Provider (PT) Frazier Butt    Next MD Visit 12/06/20      Observation/Other Assessments   Focus on Therapeutic Outcomes (FOTO)  63% function      AROM   Left Knee Extension 5   lacking   Left Knee Flexion 115      Ambulation/Gait   Ambulation/Gait Yes    Ambulation/Gait Assistance 7: Independent    Ambulation Distance (Feet) 350 Feet    Assistive device None    Gait Pattern Left flexed knee in stance    Ambulation Surface Level    Gait Comments 2MWT                         [facility] Adult PT  Treatment/Exercise - 12/06/20 0001      Knee/Hip Exercises: Aerobic   Stationary Bike Seat 13 full revolution 5 min EOS      Knee/Hip Exercises: Standing   Knee Flexion Strengthening;Left;3 sets;10 reps   10 lbs LLE   Hip Flexion Stengthening;Left;3 sets;10 reps   10 lbs LLE     Knee/Hip Exercises: Seated   Long Arc Quad Strengthening;Left;3 sets;10 reps   10 lbs LLE     Knee/Hip Exercises: Supine   Quad Sets Strengthening;3 sets;10 reps   while performing SKTC with RLE to increase hip extension                 PT Education - 12/06/20 1010    Education Details pt educated in progress with POC details and review of STG with pt achieving 3/4    Person(s) Educated Patient    Methods Explanation;Demonstration    Comprehension Verbalized understanding;Returned demonstration            PT Short Term Goals - 12/06/20 0953      PT SHORT TERM GOAL #1   Title Patient will be independent with HEP in order to improve  functional outcomes.    Baseline in development    Time 3    Period Weeks    Status Achieved    Target Date 12/01/20      PT SHORT TERM GOAL #2   Title Patient will improve on FOTO score to meet predicted outcomes to improve functional indepdence    Baseline 39% function. 12/06/20: 63% function    Time 3    Period Weeks    Status On-going    Target Date 12/01/20      PT SHORT TERM GOAL #3   Title Patient will demonstrate left knee ROM lacking 5 extension and 115 flexion to improve gait mechanics    Baseline lacking 5 degrees extension, 115 flexion    Time 3    Period Weeks    Status Achieved    Target Date 12/01/20      PT SHORT TERM GOAL #4   Title Patient will report at least 50% improvement in overall symptoms and function to demonstrate overall improved functional ability    Baseline pt reports 90-95% improvement since start of care.    Time 3    Period Weeks    Status Achieved    Target Date 12/01/20             PT Long Term Goals - 12/06/20 1023      PT LONG TERM GOAL #1   Title Patient will improve functional ambulation as evidenced by 325 ft during 2MWT    Baseline 350 without AD    Time 6    Period Weeks    Status Achieved      PT LONG TERM GOAL #2   Title Patient will exhibit 0 degrees keft knee extension to normalize gait    Baseline lacking 15 and 106 flexion    Time 6    Period Weeks    Status On-going                 Plan - 12/06/20 0950    Clinical Impression Statement Patient demonstrates global improvements in overall function as evidenced by decreased pain and ambulation dysfunction and has been ambulating without AD when on level surfaces. Discussed with patient regarding transition from formal PT sessions to progress to independent gym-based program    Personal Factors and Comorbidities Age;Comorbidity 1;Time  since onset of injury/illness/exacerbation    Comorbidities hx of LLE injury    Examination-Activity Limitations  Bend;Carry;Lift;Locomotion Level;Squat;Stairs;Stand;Transfers    Examination-Participation Restrictions Community Activity;Driving;Laundry;Yard Work;Shop    Stability/Clinical Decision Making Stable/Uncomplicated    Rehab Potential Good    PT Frequency 3x / week    PT Duration 6 weeks    PT Treatment/Interventions ADLs/Self Care Home Management;Aquatic Therapy;Cryotherapy;Electrical Stimulation;DME Instruction;Ultrasound;Moist Heat;Gait training;Stair training;Functional mobility training;Therapeutic activities;Therapeutic exercise;Balance training;Patient/family education;Neuromuscular re-education;Manual techniques;Manual lymph drainage;Compression bandaging;Passive range of motion;Taping;Dry needling;Spinal Manipulations;Joint Manipulations    PT Next Visit Plan continue with body mechanics and WBing exercises as able (Rt knee is also bone on bone).  continue with manual to help reduce edema as needed.  FU on compression hose use.    PT Home Exercise Plan completed QS, heel slides, prone knee hang 11/16/20: bridge, SLR, seated HS stretch, heel raise. With red t-loop: Side stepping, standing hip flexion, hamstring curls, prone quad stretch    Consulted and Agree with Plan of Care Patient           Patient will benefit from skilled therapeutic intervention in order to improve the following deficits and impairments:  Abnormal gait,Decreased activity tolerance,Decreased balance,Decreased mobility,Decreased range of motion,Decreased safety awareness,Decreased strength,Increased edema,Difficulty walking,Impaired perceived functional ability,Impaired flexibility,Improper body mechanics,Pain  Visit Diagnosis: Total knee replacement status, left  Difficulty in walking, not elsewhere classified  Decreased range of motion (ROM) of left knee     Problem List Patient Active Problem List   Diagnosis Date Noted  . Bloating 07/25/2020  . GERD (gastroesophageal reflux disease) 07/25/2020  .  Constipation 04/20/2020  . Family history of colon cancer 04/20/2020  . Rupture of right triceps tendon 09/01/2015  . Anxiety 03/20/2015  . Asthma, chronic   . Vertigo 03/19/2015  . Essential hypertension 03/19/2015  . Adenocarcinoma of prostate ([place]) 10/04/2013  . Malignant neoplasm of prostate ([place]) 02/13/2012  . 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 Genuine stress incontinence, male 02/13/2012    Toniann Fail 12/06/2020, 10:28 AM  [facility] [address] [place], [place], [address] Phone: [phone]   Fax:  [phone]  Name: Roberto Sanchez. MRN: [id number] Date of Birth: 1946-09-15

## 2020-12-08 ENCOUNTER — Telehealth (HOSPITAL_COMMUNITY): Payer: Self-pay | Admitting: Physical Therapy

## 2020-12-08 ENCOUNTER — Ambulatory Visit (HOSPITAL_COMMUNITY): Payer: Medicare Other | Admitting: Physical Therapy

## 2020-12-08 NOTE — Telephone Encounter (Signed)
No show #1, called patient and he stated he thought his appt was at 2:30 not 8:30. Informed of next PT visit on 1/17 at 8:30 am.   8:51 AM,12/08/20 Domenic Moras, PT, DPT Physical Therapist at [facility]

## 2020-12-11 ENCOUNTER — Encounter (HOSPITAL_COMMUNITY): Payer: No Typology Code available for payment source | Admitting: Physical Therapy

## 2020-12-13 ENCOUNTER — Ambulatory Visit (HOSPITAL_COMMUNITY): Payer: Medicare Other

## 2020-12-14 ENCOUNTER — Encounter (HOSPITAL_COMMUNITY): Payer: No Typology Code available for payment source

## 2020-12-15 ENCOUNTER — Encounter (HOSPITAL_COMMUNITY): Payer: Self-pay

## 2020-12-15 ENCOUNTER — Telehealth (HOSPITAL_COMMUNITY): Payer: Self-pay

## 2020-12-15 ENCOUNTER — Ambulatory Visit (HOSPITAL_COMMUNITY): Payer: Medicare Other

## 2020-12-15 DIAGNOSIS — M25662 Stiffness of left knee, not elsewhere classified: Secondary | ICD-10-CM

## 2020-12-15 DIAGNOSIS — Z96652 Presence of left artificial knee joint: Secondary | ICD-10-CM

## 2020-12-15 DIAGNOSIS — R262 Difficulty in walking, not elsewhere classified: Secondary | ICD-10-CM

## 2020-12-15 NOTE — Telephone Encounter (Signed)
pt cancelled the remainder of his appts and asked to be discharged

## 2020-12-15 NOTE — Therapy (Signed)
[facility] [address] [place], [place], [address] Phone: [phone]   Fax:  [phone]  Patient Details  Name: Roberto Sanchez. MRN: [id number] Date of Birth: 03/31/1946 Referring Provider:  No ref. provider found  Encounter Date: 12/15/2020   PHYSICAL THERAPY DISCHARGE SUMMARY  Visits from Start of Care: 8 (11/10/20- 12/06/20)  Current functional level related to goals / functional outcomes: See previous progress note.  Patient initiated discharge from therapy services at this time   Remaining deficits: See previous note   Education / Equipment: Pt. Educated in HEP for self-progression Plan: Patient agrees to discharge.  Patient goals were partially met. Patient is being discharged due to the patient's request.  ?????        8:25 AM, 12/15/20 M. Sherlyn Lees, PT, DPT Physical Therapist-  Office Number: [phone]  [facility] [address] [place], [place], [address] Phone: [phone]   Fax:  [phone]

## 2020-12-18 ENCOUNTER — Ambulatory Visit (HOSPITAL_COMMUNITY): Payer: Medicare Other

## 2020-12-20 ENCOUNTER — Encounter (HOSPITAL_COMMUNITY): Payer: No Typology Code available for payment source | Admitting: Physical Therapy

## 2020-12-22 ENCOUNTER — Encounter (HOSPITAL_COMMUNITY): Payer: No Typology Code available for payment source

## 2021-01-31 ENCOUNTER — Encounter: Payer: Self-pay | Admitting: Nurse Practitioner

## 2021-01-31 ENCOUNTER — Ambulatory Visit (INDEPENDENT_AMBULATORY_CARE_PROVIDER_SITE_OTHER): Payer: Medicare Other | Admitting: Nurse Practitioner

## 2021-01-31 VITALS — BP 140/80 | HR 66 | Temp 97.1°F | Ht 70.0 in | Wt 209.8 lb

## 2021-01-31 DIAGNOSIS — Z800 Family history of malignant neoplasm of digestive organs: Secondary | ICD-10-CM

## 2021-01-31 DIAGNOSIS — K5900 Constipation, unspecified: Secondary | ICD-10-CM | POA: Diagnosis not present

## 2021-01-31 DIAGNOSIS — Z860100 Personal history of colon polyps, unspecified: Secondary | ICD-10-CM

## 2021-01-31 DIAGNOSIS — R140 Abdominal distension (gaseous): Secondary | ICD-10-CM | POA: Diagnosis not present

## 2021-01-31 DIAGNOSIS — K219 Gastro-esophageal reflux disease without esophagitis: Secondary | ICD-10-CM | POA: Diagnosis not present

## 2021-01-31 NOTE — Progress Notes (Signed)
Referring Provider: [name] Primary Care Physician:  [name] Primary GI:  Dr. Gala Romney  Chief Complaint  Patient presents with  . Bloated    W/ gas  . Constipation    Taking 2 stool softners daily and has BM every morning but does not feel he empties completely.    HPI:   Roberto Sanchez. is a 75 y.o. male who presents for follow-up on bloating and gas.  The patient was last seen in our office 07/25/2020 for constipation, bloating, GERD.  Initial referral from the [facility].  Previous ER visit with constipation and diarrhea alternating, progressively worse and failing medical management.  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  Known history of GERD on omeprazole and known history of esophageal stricture without any concerns.  Previous constipation management with stool softener but continued incomplete emptying.  Lower abdominal pain and bloating improved with bowel movement.  Trial of Linzess 72 mcg not effective and so increased to 145 which is also not effective so switch to Amitiza 8 mcg twice daily which is not affordable so we switched Trulance.  Trulance was pricey so requested vouchers from the [place].  However, the [place] states this is not covered and recommended for psyllium, MiraLAX, lactulose, Senokot, sorbitol, bisacodyl.  Subsequently recommended back to stool softeners daily and add psyllium once a day for a week then increase to twice daily.  Updated colonoscopy 07/14/2020 with diverticulosis, 5 polyps (the largest of which was 14 mm in the cecum) and recommended repeat in 1 year.  At his last visit noted still constipated, currently using Colace twice daily but not for psyllium.Marland Kitchen  Has tried multiple options including psyllium, MiraLAX, OTC laxatives including Senokot.  Has a bowel movement about every day to every 2 days with very hard stools and straining.  No ongoing hematochezia.  GERD doing well on Prilosec.  Recommended  reattempt a prescription for Amitiza 24 mcg to the [place], in the interim continue Colace 100 mg twice daily and start MiraLAX 1 capful once daily.  Can increase MiraLAX to twice daily after 1 week.  Over-the-counter laxative as needed, follow-up in 3 months.  Today he states he's doing ok overall. He tried MiraLAX as discussed last time but didn't really help much over several days. He missed his last appointment because his eldest son recently passed away as did his nephew. He states the two stool softeners worked for a while, but has stopped working as well. Has associated gas and bloating. Has a daily bowel movement but incomplete emptying. Admits he doesn't eat as much fiber as he should. He typically drinks a lot of water, but has slacked off recently. He hasn't tried Linzess 290 mcg yet. Does have associated abdominal discomfort with constipation and bloating, typically improves with a bowel movement. Denies N/V, hematochezia, melena, fever, chills, unintentional weight loss. Denies URI or flu-like symptoms. Denies loss of sense of taste or smell. The patient has received COVID-19 vaccination(s). Denies chest pain, dyspnea, dizziness, lightheadedness, syncope, near syncope. Denies any other upper or lower GI symptoms.   GERD doing well on Prilosec. Discussed due for colonoscopy in August this year. States his [place] for TCS has expired, but he will contact the [place] to request reauth.  Past Medical History:  Diagnosis Date  . Anxiety   . Arthritis   . Asthma   . GERD (gastroesophageal reflux disease)   . Hypertension   . Prostate CA ([place])   .  Rupture of right triceps tendon 09/01/2015    Past Surgical History:  Procedure Laterality Date  . COLONOSCOPY WITH PROPOFOL N/A 07/14/2020   Procedure: COLONOSCOPY WITH PROPOFOL;  Surgeon: Eloise Harman, DO;  [facility];  Service: Endoscopy;  Laterality: N/A;  10:30  . KNEE ARTHROPLASTY Left 10/2020  . KNEE ARTHROSCOPY Right 1980s  .  NASAL SINUS SURGERY    . POLYPECTOMY  07/14/2020   Procedure: POLYPECTOMY;  Surgeon: Eloise Harman, DO;  [facility];  Service: Endoscopy;;  . PROSTATE SURGERY    . TONSILLECTOMY  age 59  . TRICEPS TENDON REPAIR Right 09/01/2015   Procedure: RIGHT TRICEPS  REPAIR;  Surgeon: Marchia Bond, MD;  Location: [facility];  Service: Orthopedics;  Laterality: Right;    Current Outpatient Medications  Medication Sig Dispense Refill  . albuterol (VENTOLIN HFA) 108 (90 Base) MCG/ACT inhaler Inhale 1-2 puffs into the lungs as needed for wheezing or shortness of breath.     . ALPRAZolam (XANAX) 0.5 MG tablet Take 0.5 mg by mouth at bedtime.     Marland Kitchen aspirin EC 81 MG tablet Take 81 mg by mouth at bedtime.    . calcium carbonate (OSCAL) 1500 (600 Ca) MG TABS tablet Take 600 mg of elemental calcium by mouth daily with breakfast.    . docusate sodium (COLACE) 100 MG capsule Take 100-200 mg by mouth at bedtime.     Marland Kitchen ezetimibe (ZETIA) 10 MG tablet Take 10 mg by mouth daily.    . Fluticasone-Salmeterol (ADVAIR) 250-50 MCG/DOSE AEPB Inhale 1 puff into the lungs every 12 (twelve) hours.    . hydrochlorothiazide (HYDRODIURIL) 25 MG tablet Take 25 mg by mouth daily.    . hydroxypropyl methylcellulose / hypromellose (ISOPTO TEARS / GONIOVISC) 2.5 % ophthalmic solution Place 1 drop into both eyes as needed for dry eyes.     Marland Kitchen losartan (COZAAR) 100 MG tablet Take 100 mg by mouth daily.    . montelukast (SINGULAIR) 10 MG tablet Take 10 mg by mouth daily.    . niacin 500 MG tablet Take 500 mg by mouth daily.     . Omega-3 Fatty Acids (FISH OIL) 1000 MG CAPS Take 1,000 mg by mouth daily.     Marland Kitchen omeprazole (PRILOSEC) 20 MG capsule Take 20 mg by mouth daily.  3   No current facility-administered medications for this visit.    Allergies as of 01/31/2021 - Review Complete 01/31/2021  Allergen Reaction Noted  . Penicillins Other (See Comments) 01/27/2012  . Shellfish allergy Hives, Shortness Of  Breath, and Swelling 10/04/2011    Family History  Problem Relation Age of Onset  . Asthma Maternal Uncle   . Allergic rhinitis Brother   . Allergic rhinitis Brother   . Colon cancer Father 52  . Angioedema Neg Hx   . Eczema Neg Hx   . Urticaria Neg Hx   . Immunodeficiency Neg Hx     Social History   Socioeconomic History  . Marital status: Married    Spouse name: Not on file  . Number of children: Not on file  . Years of education: Not on file  . Highest education level: Not on file  Occupational History  . Not on file  Tobacco Use  . Smoking status: Former Smoker    Packs/day: 3.00    Years: 20.00    Pack years: 60.00    Types: Cigarettes    Quit date: 08/30/1983    Years since  quitting: 37.4  . Smokeless tobacco: Former Systems developer    Types: [place] date: 08/29/1985  Vaping Use  . Vaping Use: Never used  Substance and Sexual Activity  . Alcohol use: Yes    Comment: 3 to 5 beers weekly  . Drug use: No  . Sexual activity: Not on file  Other Topics Concern  . Not on file  Social History Narrative  . Not on file   Social Determinants of Health   Financial Resource Strain: Not on file  Food Insecurity: Not on file  Transportation Needs: Not on file  Physical Activity: Not on file  Stress: Not on file  Social Connections: Not on file    Subjective: Review of Systems  Constitutional: Negative for chills, fever, malaise/fatigue and weight loss.  HENT: Negative for congestion and sore throat.   Respiratory: Negative for cough and shortness of breath.   Cardiovascular: Negative for chest pain and palpitations.  Gastrointestinal: Positive for constipation. Negative for abdominal pain, blood in stool, diarrhea, heartburn, melena, nausea and vomiting.       Bloating and gas  Musculoskeletal: Negative for joint pain and myalgias.  Skin: Negative for rash.  Neurological: Negative for dizziness and weakness.  Endo/Heme/Allergies: Does not bruise/bleed easily.   Psychiatric/Behavioral: Negative for depression. The patient is not nervous/anxious.   All other systems reviewed and are negative.    Objective: BP 140/80   Pulse 66   Temp (!) 97.1 F (36.2 C)   Ht 5\' 10"  (1.778 m)   Wt 209 lb 12.8 oz (95.2 kg)   BMI 30.10 kg/m  Physical Exam Vitals and nursing note reviewed.  Constitutional:      General: He is not in acute distress.    Appearance: Normal appearance. He is obese. He is not ill-appearing, toxic-appearing or diaphoretic.  HENT:     Head: Normocephalic and atraumatic.     Nose: No congestion or rhinorrhea.  Eyes:     General: No scleral icterus. Cardiovascular:     Rate and Rhythm: Normal rate and regular rhythm.     Heart sounds: Normal heart sounds.  Pulmonary:     Effort: Pulmonary effort is normal.     Breath sounds: Normal breath sounds.  Abdominal:     General: Bowel sounds are normal. There is no distension.     Palpations: Abdomen is soft. There is no hepatomegaly, splenomegaly or mass.     Tenderness: There is no abdominal tenderness. There is no guarding or rebound.     Hernia: No hernia is present.  Musculoskeletal:     Cervical back: Neck supple.  Skin:    General: Skin is warm and dry.     Coloration: Skin is not jaundiced.     Findings: No bruising or rash.  Neurological:     General: No focal deficit present.     Mental Status: He is alert and oriented to person, place, and time. Mental status is at baseline.  Psychiatric:        Mood and Affect: Mood normal.        Behavior: Behavior normal.        Thought Content: Thought content normal.      Assessment:  Very pleasant 75 year old male presents to follow-up on GERD, constipation, bloating, previous colon polyps.  No red flag/warning signs or symptoms at this time.  Constipation with bloating and gas: I feel the symptoms are all interrelated likely due to his ongoing constipation.  He has  previously tried Linzess 72 mcg, 145 mcg.  We were going  to try Amitiza but the VA pocket covering and recommended over-the-counter options and lactulose (as outlined in HPI).  He has not tried Linzess 290 mcg.  He is currently on Colace twice a day and initially this worked well for him but not so much lately.  He has had a drop off in his water consumption.  He admits he does not eat enough fiber.  At this point I would like to have him continue his stool softeners we will try high-dose Linzess 290 mcg.  If he develops diarrhea he can hold the stool softeners to see if Linzess is effective on exam.  Request progress report in 1 to 2 weeks.  If Linzess 290 mcg does not make an impact, further options could include fiber supplement, increase water intake, possible lactulose.  We will also contact the [facility] to find out which constipation medications they would cover so he can be aware of options.  History of colon polyps: Previous colonoscopy about 9 months ago as outlined in HPI.  He did have several polyps including a large polyp and recommended 1 year repeat.  He states his authorization from the [place] has expired but he will contact them to get another authorization for repeat colonoscopy which will be due around August of this year.  Further recommendations will follow.  GERD: Currently doing well on Prilosec.  No breakthrough symptoms.  Recommend he continue his current medications   Plan: 1. Trial samples of Linzess 290 mcg 2. Contact the [facility] to inquire about coverage of constipation medications 3. Further recommendations for constipation to follow-up 4. Continue other medications 5. Follow-up in 2 months for general follow-up and scheduling of colonoscopy    Thank you for allowing [place] to participate in the care of Roberto Sanchez.  Walden Field, DNP, AGNP-C Adult & Gerontological Nurse Practitioner [facility]   01/31/2021 11:24 AM   Disclaimer: This note was dictated with voice recognition software. Similar sounding words  can inadvertently be transcribed and may not be corrected upon review.

## 2021-01-31 NOTE — Progress Notes (Signed)
Cc'ed to pcp °

## 2021-01-31 NOTE — Patient Instructions (Signed)
Your health issues we discussed today were:   GERD (reflux/heartburn): 1. I am glad you are doing well with this! 2. Continue taking omeprazole (Prilosec) as you have been 3. Call us if you have any worsening or severe symptoms  Constipation with bloating and gas: 1. Continue taking stool softener twice a day for now 2. I am giving you samples of Linzess 290 mcg.  Take this once a day on an empty stomach, preferably in the morning 3. If you develop diarrhea that lasts more than a couple days stop taking the stool softeners 4. If diarrhea still persists, stop Linzess and notify our office 5. We can make further recommendations depending on how you react to the Linzess 6. We will also contact the [place] to find out what medications they will cover for constipation so we will know our options  Need for repeat colonoscopy: 1. As we discussed, you will be due for repeat colonoscopy in August of this year 2. Contact the [facility] so they can reauthorize a repeat colonoscopy for you 3. Call us with any problems  Overall I recommend:  1. Continue other current medications 2. Return for follow-up in 2 months 3. Call us for any questions or concerns   ---------------------------------------------------------------  I am glad you have gotten your COVID-19 vaccination!  Even though you are fully vaccinated you should continue to follow CDC and state/local guidelines.  ---------------------------------------------------------------   At [facility] we value your feedback. You may receive a survey about your visit today. Please share your experience as we strive to create trusting relationships with our patients to provide genuine, compassionate, quality care.  We appreciate your understanding and patience as we review any laboratory studies, imaging, and other diagnostic tests that are ordered as we care for you. Our office policy is 5 business days for review of these results, and any  emergent or urgent results are addressed in a timely manner for your best interest. If you do not hear from our office in 1 week, please contact us.   We also encourage the use of MyChart, which contains your medical information for your review as well. If you are not enrolled in this feature, an access code is on this after visit summary for your convenience. Thank you for allowing [place] to be involved in your care.  It was great to see you today!  I hope you have a great spring!!  Again, I am sorry to hear about your son's passing.

## 2021-02-25 ENCOUNTER — Other Ambulatory Visit: Payer: Self-pay

## 2021-02-25 ENCOUNTER — Ambulatory Visit
Admission: EM | Admit: 2021-02-25 | Discharge: 2021-02-25 | Disposition: A | Discharge status: Home / Self Care | Payer: Medicare Other | Attending: Family Medicine | Admitting: Family Medicine

## 2021-02-25 ENCOUNTER — Encounter: Payer: Self-pay | Admitting: Emergency Medicine

## 2021-02-25 DIAGNOSIS — J0140 Acute pansinusitis, unspecified: Secondary | ICD-10-CM | POA: Diagnosis not present

## 2021-02-25 MED ORDER — DOXYCYCLINE HYCLATE 100 MG PO CAPS: 100.0000 mg | ORAL_CAPSULE | Freq: Two times a day (BID) | ORAL | 0 refills | Status: DC

## 2021-02-25 MED ORDER — BENZONATATE 100 MG PO CAPS: 100.0000 mg | ORAL_CAPSULE | Freq: Three times a day (TID) | ORAL | 0 refills | Status: DC | PRN

## 2021-02-25 NOTE — ED Triage Notes (Signed)
Sinus congestion, sore throat and cough x 1 week.

## 2021-02-25 NOTE — ED Provider Notes (Signed)
[facility]    CSN: [id number] Arrival date & time: 02/25/21  7673      History   Chief Complaint No chief complaint on file.   HPI Roberto Sanchez. is a 75 y.o. male.   HPI  SINUSITIS Patient presents with concern for possible sinus infection. Onset: >1 week.. This is a recurrent problem that patient reports occurring at least 1-2 times per year. Endorse facial pressure, frontal headache,  copious mucus,  , ST. Denies shortness of breath, chest tightness, eye irritation,  or GI symptoms. Attempted relief with OTC and chronic allergy medication without relief of symptoms. Patient has chronic asthma and allergies.  Remainder of Review of Systems negative except as noted in the HPI.   Past Medical History:  Diagnosis Date  . Anxiety   . Arthritis   . Asthma   . GERD (gastroesophageal reflux disease)   . Hypertension   . Prostate CA ([place])   . Rupture of right triceps tendon 09/01/2015    Patient Active Problem List   Diagnosis Date Noted  . History of colonic polyps 01/31/2021  . Bloating 07/25/2020  . GERD (gastroesophageal reflux disease) 07/25/2020  . Constipation 04/20/2020  . Family history of colon cancer 04/20/2020  . Rupture of right triceps tendon 09/01/2015  . Anxiety 03/20/2015  . Asthma, chronic   . Vertigo 03/19/2015  . Essential hypertension 03/19/2015  . Adenocarcinoma of prostate ([place]) 10/04/2013  . Malignant neoplasm of prostate ([place]) 02/13/2012  . 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 Genuine stress incontinence, male 02/13/2012    Past Surgical History:  Procedure Laterality Date  . COLONOSCOPY WITH PROPOFOL N/A 07/14/2020   Procedure: COLONOSCOPY WITH PROPOFOL;  Surgeon: Eloise Harman, DO;  [facility];  Service: Endoscopy;  Laterality: N/A;  10:30  . KNEE ARTHROPLASTY Left 10/2020  . KNEE ARTHROSCOPY Right 1980s  . NASAL SINUS SURGERY    . POLYPECTOMY  07/14/2020   Procedure: POLYPECTOMY;   Surgeon: Eloise Harman, DO;  [facility];  Service: Endoscopy;;  . PROSTATE SURGERY    . TONSILLECTOMY  age 68  . TRICEPS TENDON REPAIR Right 09/01/2015   Procedure: RIGHT TRICEPS  REPAIR;  Surgeon: Marchia Bond, MD;  Location: [facility];  Service: Orthopedics;  Laterality: Right;       Home Medications    Prior to Admission medications   Medication Sig Start Date End Date Taking? Authorizing Provider  benzonatate (TESSALON) 100 MG capsule Take 1 capsule (100 mg total) by mouth 3 (three) times daily as needed for cough. 02/25/21  Yes Scot Jun, FNP  doxycycline (VIBRAMYCIN) 100 MG capsule Take 1 capsule (100 mg total) by mouth 2 (two) times daily. 02/25/21  Yes Scot Jun, FNP  albuterol (VENTOLIN HFA) 108 (90 Base) MCG/ACT inhaler Inhale 1-2 puffs into the lungs as needed for wheezing or shortness of breath.  03/16/08   [provider]  ALPRAZolam Duanne Moron) 0.5 MG tablet Take 0.5 mg by mouth at bedtime.     [provider]  aspirin EC 81 MG tablet Take 81 mg by mouth at bedtime.    [provider]  calcium carbonate (OSCAL) 1500 (600 Ca) MG TABS tablet Take 600 mg of elemental calcium by mouth daily with breakfast.    [provider]  docusate sodium (COLACE) 100 MG capsule Take 100-200 mg by mouth at bedtime.     [provider]  ezetimibe (ZETIA) 10  MG tablet Take 10 mg by mouth daily.    [provider]  Fluticasone-Salmeterol (ADVAIR) 250-50 MCG/DOSE AEPB Inhale 1 puff into the lungs every 12 (twelve) hours.    [provider]  hydrochlorothiazide (HYDRODIURIL) 25 MG tablet Take 25 mg by mouth daily.    [provider]  hydroxypropyl methylcellulose / hypromellose (ISOPTO TEARS / GONIOVISC) 2.5 % ophthalmic solution Place 1 drop into both eyes as needed for dry eyes.     [provider]  losartan (COZAAR) 100 MG tablet Take 100 mg by mouth daily.    [provider]  montelukast (SINGULAIR) 10 MG tablet Take 10 mg by mouth daily. 03/18/15   [provider]  niacin 500 MG tablet Take 500 mg by mouth daily.     [provider]  Omega-3 Fatty Acids (FISH OIL) 1000 MG CAPS Take 1,000 mg by mouth daily.     [provider]  omeprazole (PRILOSEC) 20 MG capsule Take 20 mg by mouth daily. 01/27/15   [provider]    Family History Family History  Problem Relation Age of Onset  . Asthma Maternal Uncle   . Allergic rhinitis Brother   . Allergic rhinitis Brother   . Colon cancer Father 97  . Angioedema Neg Hx   . Eczema Neg Hx   . Urticaria Neg Hx   . Immunodeficiency Neg Hx     Social History Social History   Tobacco Use  . Smoking status: Former Smoker    Packs/day: 3.00    Years: 20.00    Pack years: 60.00    Types: Cigarettes    Quit date: 08/30/1983    Years since quitting: 37.5  . Smokeless tobacco: Former Systems developer    Types: [place] date: 08/29/1985  Vaping Use  . Vaping Use: Never used  Substance Use Topics  . Alcohol use: Yes    Comment: 3 to 5 beers weekly  . Drug use: No     Allergies   Penicillins and Shellfish allergy   Review of Systems Review of Systems Pertinent negatives listed in HPI   Physical Exam Triage Vital Signs ED Triage Vitals  Enc Vitals Group     BP 02/25/21 1204 (!) 155/82     Pulse Rate 02/25/21 1204 70     Resp 02/25/21 1204 16     Temp 02/25/21 1204 98.8 F (37.1 C)     Temp Source 02/25/21 1204 Oral     SpO2 02/25/21 1204 94 %     Weight --      Height --      Head Circumference --      Peak Flow --      Pain Score 02/25/21 1111 7     Pain Loc --      Pain Edu? --      Excl. in [place]? --    No data found.  Updated Vital Signs BP (!) 155/82 (BP Location: Right Arm)   Pulse 70   Temp 98.8 F (37.1 C) (Oral)   Resp 16   SpO2 94%   Visual Acuity Right Eye Distance:   Left Eye Distance:   Bilateral Distance:    Right Eye Near:    Left Eye Near:    Bilateral Near:     Physical Exam  General Appearance:    Alert, acutely ill appearing, cooperative, no distress  HENT:   Normocephalic, ears normal, nares mucosal edema with congestion, rhinorrhea, oropharynx  erythematous w/o exudate  Eyes:    PERRL, conjunctiva/corneas clear, EOM's intact       Lungs:     Diminished CTABL  respirations unlabored, normal air entry   Heart:    Regular rate and rhythm  Neurologic:   Awake, alert, oriented x 3. No apparent focal neurological           defect.      UC Treatments / Results  Labs (all labs ordered are listed, but only abnormal results are displayed) Labs Reviewed - No data to display  EKG   Radiology No results found.  Procedures Procedures (including critical care time)  Medications Ordered in UC Medications - No data to display  Initial Impression / Assessment and Plan / UC Course  I have reviewed the triage vital signs and the nursing notes.  Pertinent labs & imaging results that were available during my care of the patient were reviewed by me and considered in my medical decision making (see chart for details).    Acute sinusitis without asthma exacerbation.  Start doxycycline twice daily for 10 days.  For acute cough benzonatate [place].  Continue current allergy regimen.  Red flags discussed if symptoms worsen or do not improve.  Patient verbalized understanding and agreement with today's plan. Final Clinical Impressions(s) / UC Diagnoses   Final diagnoses:  Acute non-recurrent pansinusitis   Discharge Instructions   None    ED Prescriptions    Medication Sig Dispense Auth. Provider   doxycycline (VIBRAMYCIN) 100 MG capsule Take 1 capsule (100 mg total) by mouth 2 (two) times daily. 20 capsule Scot Jun, FNP   benzonatate (TESSALON) 100 MG capsule Take 1 capsule (100 mg total) by mouth 3 (three) times daily as needed for cough. 40 capsule Scot Jun, FNP     PDMP not reviewed  this encounter.   Scot Jun, FNP 02/25/21 1214

## 2021-03-05 ENCOUNTER — Encounter: Payer: Self-pay | Admitting: Internal Medicine

## 2021-03-10 ENCOUNTER — Other Ambulatory Visit: Payer: Self-pay

## 2021-03-10 ENCOUNTER — Ambulatory Visit
Admission: EM | Admit: 2021-03-10 | Discharge: 2021-03-10 | Disposition: A | Discharge status: Home / Self Care | Payer: Medicare Other | Attending: Emergency Medicine | Admitting: Emergency Medicine

## 2021-03-10 ENCOUNTER — Encounter: Payer: Self-pay | Admitting: Emergency Medicine

## 2021-03-10 DIAGNOSIS — J329 Chronic sinusitis, unspecified: Secondary | ICD-10-CM | POA: Diagnosis not present

## 2021-03-10 DIAGNOSIS — J310 Chronic rhinitis: Secondary | ICD-10-CM

## 2021-03-10 MED ORDER — LEVOFLOXACIN 500 MG PO TABS: 500.0000 mg | ORAL_TABLET | Freq: Every day | ORAL | 0 refills | Status: DC

## 2021-03-10 MED ORDER — DEXAMETHASONE SODIUM PHOSPHATE 10 MG/ML IJ SOLN
10.0000 mg | Freq: Once | INTRAMUSCULAR | Status: AC
  Administered 2021-03-10: 10 mg via INTRAMUSCULAR

## 2021-03-10 MED ORDER — PREDNISONE 20 MG PO TABS: 20.0000 mg | ORAL_TABLET | Freq: Two times a day (BID) | ORAL | 0 refills | Status: AC

## 2021-03-10 NOTE — ED Triage Notes (Signed)
Was seen April 3rd for same compliant.  States he is not feeling any better.  Sinus congestion and headache.  States he is aching all over.

## 2021-03-10 NOTE — ED Provider Notes (Signed)
Roberto Sanchez   [id number] 03/10/21 Arrival Time: 0932   CC: sinus infection  SUBJECTIVE: History from: patient.  Roberto Sanchez. is a 75 y.o. male who presents with sinus pain and pressure x 2 weeks.  Seen here on 4/3 and treated with doxycycline with minimal improvement.  Reports previous symptoms in the past and treated with steroid with relief.  Complains of fatigued, and body aches.  Denies fever, chills, SOB, wheezing, chest pain, nausea, changes in bowel or bladder habits.     ROS: As per HPI.  All other pertinent ROS negative.     Past Medical History:  Diagnosis Date  . Anxiety   . Arthritis   . Asthma   . GERD (gastroesophageal reflux disease)   . Hypertension   . Prostate CA ([place])   . Rupture of right triceps tendon 09/01/2015   Past Surgical History:  Procedure Laterality Date  . COLONOSCOPY WITH PROPOFOL N/A 07/14/2020   Procedure: COLONOSCOPY WITH PROPOFOL;  Surgeon: Eloise Harman, DO;  [facility];  Service: Endoscopy;  Laterality: N/A;  10:30  . KNEE ARTHROPLASTY Left 10/2020  . KNEE ARTHROSCOPY Right 1980s  . NASAL SINUS SURGERY    . POLYPECTOMY  07/14/2020   Procedure: POLYPECTOMY;  Surgeon: Eloise Harman, DO;  [facility];  Service: Endoscopy;;  . PROSTATE SURGERY    . TONSILLECTOMY  age 72  . TRICEPS TENDON REPAIR Right 09/01/2015   Procedure: RIGHT TRICEPS  REPAIR;  Surgeon: Marchia Bond, MD;  Location: [facility];  Service: Orthopedics;  Laterality: Right;   Allergies  Allergen Reactions  . Penicillins Other (See Comments)    Causes patient to "pass out"DID THE REACTION INVOLVE: Swelling of the face/tongue/throat, SOB, or low BP? No Sudden or severe rash/hives, skin peeling, or the inside of the mouth or nose? No Did it require medical treatment? No When did it last happen? If all above answers are "NO", may proceed with cephalosporin use.   . Shellfish Allergy Hives, Shortness Of  Breath and Swelling    Facial Swelling  ears turn red   No current facility-administered medications on file prior to encounter.   Current Outpatient Medications on File Prior to Encounter  Medication Sig Dispense Refill  . albuterol (VENTOLIN HFA) 108 (90 Base) MCG/ACT inhaler Inhale 1-2 puffs into the lungs as needed for wheezing or shortness of breath.     . ALPRAZolam (XANAX) 0.5 MG tablet Take 0.5 mg by mouth at bedtime.     Marland Kitchen aspirin EC 81 MG tablet Take 81 mg by mouth at bedtime.    . benzonatate (TESSALON) 100 MG capsule Take 1 capsule (100 mg total) by mouth 3 (three) times daily as needed for cough. 40 capsule 0  . calcium carbonate (OSCAL) 1500 (600 Ca) MG TABS tablet Take 600 mg of elemental calcium by mouth daily with breakfast.    . docusate sodium (COLACE) 100 MG capsule Take 100-200 mg by mouth at bedtime.     Marland Kitchen doxycycline (VIBRAMYCIN) 100 MG capsule Take 1 capsule (100 mg total) by mouth 2 (two) times daily. 20 capsule 0  . ezetimibe (ZETIA) 10 MG tablet Take 10 mg by mouth daily.    . Fluticasone-Salmeterol (ADVAIR) 250-50 MCG/DOSE AEPB Inhale 1 puff into the lungs every 12 (twelve) hours.    . hydrochlorothiazide (HYDRODIURIL) 25 MG tablet Take 25 mg by mouth daily.    . hydroxypropyl methylcellulose / hypromellose (ISOPTO TEARS / GONIOVISC) 2.5 %  ophthalmic solution Place 1 drop into both eyes as needed for dry eyes.     Marland Kitchen losartan (COZAAR) 100 MG tablet Take 100 mg by mouth daily.    . montelukast (SINGULAIR) 10 MG tablet Take 10 mg by mouth daily.    . niacin 500 MG tablet Take 500 mg by mouth daily.     . Omega-3 Fatty Acids (FISH OIL) 1000 MG CAPS Take 1,000 mg by mouth daily.     Marland Kitchen omeprazole (PRILOSEC) 20 MG capsule Take 20 mg by mouth daily.  3   Social History   Socioeconomic History  . Marital status: Married    Spouse name: Not on file  . Number of children: Not on file  . Years of education: Not on file  . Highest education level: Not on file   Occupational History  . Not on file  Tobacco Use  . Smoking status: Former Smoker    Packs/day: 3.00    Years: 20.00    Pack years: 60.00    Types: Cigarettes    Quit date: 08/30/1983    Years since quitting: 37.5  . Smokeless tobacco: Former Systems developer    Types: [place] date: 08/29/1985  Vaping Use  . Vaping Use: Never used  Substance and Sexual Activity  . Alcohol use: Yes    Comment: 3 to 5 beers weekly  . Drug use: No  . Sexual activity: Not on file  Other Topics Concern  . Not on file  Social History Narrative  . Not on file   Social Determinants of Health   Financial Resource Strain: Not on file  Food Insecurity: Not on file  Transportation Needs: Not on file  Physical Activity: Not on file  Stress: Not on file  Social Connections: Not on file  Intimate Partner Violence: Not on file   Family History  Problem Relation Age of Onset  . Asthma Maternal Uncle   . Allergic rhinitis Brother   . Allergic rhinitis Brother   . Colon cancer Father 45  . Angioedema Neg Hx   . Eczema Neg Hx   . Urticaria Neg Hx   . Immunodeficiency Neg Hx     OBJECTIVE:  Vitals:   03/10/21 1408  BP: 131/77  Pulse: 83  Resp: 18  Temp: 98.1 F (36.7 C)  TempSrc: Oral  SpO2: 96%     General appearance: alert; appears fatigued, but nontoxic; speaking in full sentences and tolerating own secretions HEENT: NCAT; Ears: EACs clear, TMs pearly gray; Eyes: PERRL.  EOM grossly intact. Sinuses: TTP; Nose: nares patent without rhinorrhea, Throat: oropharynx clear, tonsils non erythematous or enlarged, uvula midline  Neck: supple without LAD Lungs: unlabored respirations, symmetrical air entry; cough: absent; no respiratory distress; CTAB Heart: regular rate and rhythm. Skin: warm and dry Psychological: alert and cooperative; normal mood and affect  ASSESSMENT & PLAN:  1. Rhinosinusitis     Meds ordered this encounter  Medications  . dexamethasone (DECADRON) injection 10 mg  .  predniSONE (DELTASONE) 20 MG tablet    Sig: Take 1 tablet (20 mg total) by mouth 2 (two) times daily with a meal for 5 days.    Dispense:  10 tablet    Refill:  0    Order Specific Question:   Supervising Provider    Answer:   Raylene Everts [[id number]]  . levofloxacin (LEVAQUIN) 500 MG tablet    Sig: Take 1 tablet (500 mg total) by mouth daily.  Dispense:  5 tablet    Refill:  0    Order Specific Question:   Supervising Provider    Answer:   Raylene Everts [[id number]]   Steroid shot given in office Rest and push fluids Prednisone prescribed.  Take as directed and to completion Levaquin prescribed.  Take as directed and to completion Continue with OTC ibuprofen/tylenol as needed for pain Follow up with PCP or [facility] if symptoms persists Return or go to the ED if you have any new or worsening symptoms such as fever, chills, worsening sinus pain/pressure, cough, sore throat, chest pain, shortness of breath, abdominal pain, changes in bowel or bladder habits, etc...   Reviewed expectations re: course of current medical issues. Questions answered. Outlined signs and symptoms indicating need for more acute intervention. Patient verbalized understanding. After Visit Summary given.         Lestine Box, PA-C 03/10/21 1443

## 2021-03-10 NOTE — Discharge Instructions (Signed)
Steroid shot given in office Rest and push fluids Prednisone prescribed.  Take as directed and to completion Levaquin prescribed.  Take as directed and to completion Continue with OTC ibuprofen/tylenol as needed for pain Follow up with PCP or [facility] if symptoms persists Return or go to the ED if you have any new or worsening symptoms such as fever, chills, worsening sinus pain/pressure, cough, sore throat, chest pain, shortness of breath, abdominal pain, changes in bowel or bladder habits, etc..Marland Kitchen

## 2021-03-11 LAB — COVID-19, FLU A+B NAA
Influenza A, NAA: NOT DETECTED
Influenza B, NAA: NOT DETECTED
SARS-CoV-2, NAA: NOT DETECTED

## 2021-03-18 ENCOUNTER — Encounter (HOSPITAL_COMMUNITY): Payer: Self-pay | Admitting: Emergency Medicine

## 2021-03-18 ENCOUNTER — Other Ambulatory Visit: Payer: Self-pay

## 2021-03-18 ENCOUNTER — Emergency Department (HOSPITAL_COMMUNITY): Payer: No Typology Code available for payment source

## 2021-03-18 ENCOUNTER — Emergency Department (HOSPITAL_COMMUNITY)
Admission: EM | Admit: 2021-03-18 | Discharge: 2021-03-18 | Disposition: A | Discharge status: Home / Self Care | Payer: No Typology Code available for payment source | Attending: Emergency Medicine | Admitting: Emergency Medicine

## 2021-03-18 DIAGNOSIS — Z7982 Long term (current) use of aspirin: Secondary | ICD-10-CM | POA: Diagnosis not present

## 2021-03-18 DIAGNOSIS — J45909 Unspecified asthma, uncomplicated: Secondary | ICD-10-CM | POA: Diagnosis not present

## 2021-03-18 DIAGNOSIS — R202 Paresthesia of skin: Secondary | ICD-10-CM | POA: Insufficient documentation

## 2021-03-18 DIAGNOSIS — Z79899 Other long term (current) drug therapy: Secondary | ICD-10-CM | POA: Diagnosis not present

## 2021-03-18 DIAGNOSIS — Z7951 Long term (current) use of inhaled steroids: Secondary | ICD-10-CM | POA: Diagnosis not present

## 2021-03-18 DIAGNOSIS — R531 Weakness: Secondary | ICD-10-CM | POA: Diagnosis present

## 2021-03-18 DIAGNOSIS — I10 Essential (primary) hypertension: Secondary | ICD-10-CM | POA: Diagnosis not present

## 2021-03-18 DIAGNOSIS — Z8546 Personal history of malignant neoplasm of prostate: Secondary | ICD-10-CM | POA: Diagnosis not present

## 2021-03-18 DIAGNOSIS — Z87891 Personal history of nicotine dependence: Secondary | ICD-10-CM | POA: Diagnosis not present

## 2021-03-18 LAB — COMPREHENSIVE METABOLIC PANEL
ALT: 21 U/L (ref 0–44)
AST: 17 U/L (ref 15–41)
Albumin: 3.7 g/dL (ref 3.5–5.0)
Alkaline Phosphatase: 75 U/L (ref 38–126)
Anion gap: 8 (ref 5–15)
BUN: 21 mg/dL (ref 8–23)
CO2: 28 mmol/L (ref 22–32)
Calcium: 9.1 mg/dL (ref 8.9–10.3)
Chloride: 102 mmol/L (ref 98–111)
Creatinine, Ser: 0.89 mg/dL (ref 0.61–1.24)
GFR, Estimated: >60 mL/min (ref >60)
Glucose, Bld: 97 mg/dL (ref 70–99)
Potassium: 3.8 mmol/L (ref 3.5–5.1)
Sodium: 138 mmol/L (ref 135–145)
Total Bilirubin: 0.6 mg/dL (ref 0.3–1.2)
Total Protein: 6.4 g/dL — ABNORMAL LOW (ref 6.5–8.1)

## 2021-03-18 LAB — LIPASE, BLOOD: Lipase: 34 U/L (ref 11–51)

## 2021-03-18 LAB — CBC WITH DIFFERENTIAL/PLATELET
Abs Immature Granulocytes: 0.08 10*3/uL — ABNORMAL HIGH (ref 0.00–0.07)
Basophils Absolute: 0.1 10*3/uL (ref 0.0–0.1)
Basophils Relative: 1 %
Eosinophils Absolute: 0.2 10*3/uL (ref 0.0–0.5)
Eosinophils Relative: 2 %
HCT: 42 % (ref 39.0–52.0)
Hemoglobin: 14.2 g/dL (ref 13.0–17.0)
Immature Granulocytes: 1 %
Lymphocytes Relative: 19 %
Lymphs Abs: 2 10*3/uL (ref 0.7–4.0)
MCH: 32.9 pg (ref 26.0–34.0)
MCHC: 33.8 g/dL (ref 30.0–36.0)
MCV: 97.4 fL (ref 80.0–100.0)
Monocytes Absolute: 0.6 10*3/uL (ref 0.1–1.0)
Monocytes Relative: 6 %
Neutro Abs: 7.3 10*3/uL (ref 1.7–7.7)
Neutrophils Relative %: 71 %
Platelets: 390 10*3/uL (ref 150–400)
RBC: 4.31 MIL/uL (ref 4.22–5.81)
RDW: 13.2 % (ref 11.5–15.5)
WBC: 10.2 10*3/uL (ref 4.0–10.5)
nRBC: 0 % (ref 0.0–0.2)

## 2021-03-18 LAB — URINALYSIS, ROUTINE W REFLEX MICROSCOPIC
Bilirubin Urine: NEGATIVE
Glucose, UA: NEGATIVE mg/dL
Hgb urine dipstick: NEGATIVE
Ketones, ur: NEGATIVE mg/dL
Leukocytes,Ua: NEGATIVE
Nitrite: NEGATIVE
Protein, ur: NEGATIVE mg/dL
Specific Gravity, Urine: 1.024 (ref 1.005–1.030)
pH: 6 (ref 5.0–8.0)

## 2021-03-18 LAB — TSH: TSH: 1.705 u[IU]/mL (ref 0.350–4.500)

## 2021-03-18 LAB — TROPONIN I (HIGH SENSITIVITY): Troponin I (High Sensitivity): 3 ng/L (ref <18)

## 2021-03-18 NOTE — ED Triage Notes (Signed)
Pt c/o of generalized weakness and body aches x 1 week

## 2021-03-18 NOTE — Discharge Instructions (Addendum)
Work-up here today without any acute findings.  Have printed your lab results to go with your discharge instructions.  Would recommend following up with the [facility] clinic for additional evaluation.  Return for any new or worse symptoms.

## 2021-03-18 NOTE — ED Provider Notes (Signed)
[facility] EMERGENCY DEPARTMENT Provider Note   CSN: [id number] Arrival date & time: 03/18/21  1012     History Chief Complaint  Patient presents with  . Weakness    Roberto Sanchez. is a 75 y.o. male.  Patient with complaint of some generalized weakness and bilateral lower extremity weakness at least ongoing since April 16.  Patient was seen early April and then April 16 at urgent care with concerns for may be sinus infection.  Was treated with antibiotics on April 16 they talked about this weakness.  Patient was treated with doxycycline most recently.  Patient is followed by the [facility] at [place].  Past medical history sniffing for hypertension anxiety reflux disease and a history of prostate cancer.  Patient denies any speech problems visual problems upper extremity weakness or any unilateral weakness to the lower extremities.  Has associated may be with some mild tingling to the bottom of both feet.  No incontinence.        Past Medical History:  Diagnosis Date  . Anxiety   . Arthritis   . Asthma   . GERD (gastroesophageal reflux disease)   . Hypertension   . Prostate CA ([place])   . Rupture of right triceps tendon 09/01/2015    Patient Active Problem List   Diagnosis Date Noted  . History of colonic polyps 01/31/2021  . Bloating 07/25/2020  . GERD (gastroesophageal reflux disease) 07/25/2020  . Constipation 04/20/2020  . Family history of colon cancer 04/20/2020  . Rupture of right triceps tendon 09/01/2015  . Anxiety 03/20/2015  . Asthma, chronic   . Vertigo 03/19/2015  . Essential hypertension 03/19/2015  . Adenocarcinoma of prostate ([place]) 10/04/2013  . Malignant neoplasm of prostate ([place]) 02/13/2012  . 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 Genuine stress incontinence, male 02/13/2012    Past Surgical History:  Procedure Laterality Date  . COLONOSCOPY WITH PROPOFOL N/A 07/14/2020   Procedure: COLONOSCOPY WITH PROPOFOL;  Surgeon:  Eloise Harman, DO;  [facility];  Service: Endoscopy;  Laterality: N/A;  10:30  . KNEE ARTHROPLASTY Left 10/2020  . KNEE ARTHROSCOPY Right 1980s  . NASAL SINUS SURGERY    . POLYPECTOMY  07/14/2020   Procedure: POLYPECTOMY;  Surgeon: Eloise Harman, DO;  [facility];  Service: Endoscopy;;  . PROSTATE SURGERY    . TONSILLECTOMY  age 70  . TRICEPS TENDON REPAIR Right 09/01/2015   Procedure: RIGHT TRICEPS  REPAIR;  Surgeon: Marchia Bond, MD;  Location: [place];  Service: Orthopedics;  Laterality: Right;       Family History  Problem Relation Age of Onset  . Asthma Maternal Uncle   . Allergic rhinitis Brother   . Allergic rhinitis Brother   . Colon cancer Father 31  . Angioedema Neg Hx   . Eczema Neg Hx   . Urticaria Neg Hx   . Immunodeficiency Neg Hx     Social History   Tobacco Use  . Smoking status: Former Smoker    Packs/day: 3.00    Years: 20.00    Pack years: 60.00    Types: Cigarettes    Quit date: 08/30/1983    Years since quitting: 37.5  . Smokeless tobacco: Former Systems developer    Types: [place] date: 08/29/1985  Vaping Use  . Vaping Use: Never used  Substance Use Topics  . Alcohol use: Yes    Comment: 3 to 5 beers weekly  .  Drug use: No    Home Medications Prior to Admission medications   Medication Sig Start Date End Date Taking? Authorizing Provider  albuterol (VENTOLIN HFA) 108 (90 Base) MCG/ACT inhaler Inhale 1-2 puffs into the lungs as needed for wheezing or shortness of breath.  03/16/08   [provider]  ALPRAZolam Duanne Moron) 0.5 MG tablet Take 0.5 mg by mouth at bedtime.     [provider]  aspirin EC 81 MG tablet Take 81 mg by mouth at bedtime.    [provider]  benzonatate (TESSALON) 100 MG capsule Take 1 capsule (100 mg total) by mouth 3 (three) times daily as needed for cough. 02/25/21   Scot Jun, FNP  calcium carbonate (OSCAL) 1500 (600 Ca) MG TABS tablet Take 600 mg of  elemental calcium by mouth daily with breakfast.    [provider]  docusate sodium (COLACE) 100 MG capsule Take 100-200 mg by mouth at bedtime.     [provider]  doxycycline (VIBRAMYCIN) 100 MG capsule Take 1 capsule (100 mg total) by mouth 2 (two) times daily. 02/25/21   Scot Jun, FNP  ezetimibe (ZETIA) 10 MG tablet Take 10 mg by mouth daily.    [provider]  Fluticasone-Salmeterol (ADVAIR) 250-50 MCG/DOSE AEPB Inhale 1 puff into the lungs every 12 (twelve) hours.    [provider]  hydrochlorothiazide (HYDRODIURIL) 25 MG tablet Take 25 mg by mouth daily.    [provider]  hydroxypropyl methylcellulose / hypromellose (ISOPTO TEARS / GONIOVISC) 2.5 % ophthalmic solution Place 1 drop into both eyes as needed for dry eyes.     [provider]  levofloxacin (LEVAQUIN) 500 MG tablet Take 1 tablet (500 mg total) by mouth daily. 03/10/21   [place], [place], PA-C  losartan (COZAAR) 100 MG tablet Take 100 mg by mouth daily.    [provider]  montelukast (SINGULAIR) 10 MG tablet Take 10 mg by mouth daily. 03/18/15   [provider]  niacin 500 MG tablet Take 500 mg by mouth daily.     [provider]  Omega-3 Fatty Acids (FISH OIL) 1000 MG CAPS Take 1,000 mg by mouth daily.     [provider]  omeprazole (PRILOSEC) 20 MG capsule Take 20 mg by mouth daily. 01/27/15   [provider]    Allergies    Penicillins and Shellfish allergy  Review of Systems   Review of Systems  Constitutional: Positive for fatigue. Negative for chills and fever.  HENT: Negative for rhinorrhea and sore throat.   Eyes: Negative for visual disturbance.  Respiratory: Negative for cough and shortness of breath.   Cardiovascular: Negative for chest pain and leg swelling.  Gastrointestinal: Negative for abdominal pain, diarrhea, nausea and vomiting.  Genitourinary: Negative for dysuria.  Musculoskeletal: Negative  for back pain and neck pain.  Skin: Negative for rash.  Neurological: Positive for weakness. Negative for dizziness, syncope, facial asymmetry, speech difficulty, light-headedness, numbness and headaches.  Hematological: Does not bruise/bleed easily.  Psychiatric/Behavioral: Negative for confusion.    Physical Exam Updated Vital Signs BP (!) 164/74   Pulse (!) 55   Temp 98.1 F (36.7 C)   Resp 16   Ht 1.778 m (5\' 10" )   Wt 90.7 kg   SpO2 99%   BMI 28.70 kg/m   Physical Exam Vitals and nursing note reviewed.  Constitutional:      Appearance: Normal appearance. He is well-developed.  HENT:     Head: Normocephalic and  atraumatic.  Eyes:     Extraocular Movements: Extraocular movements intact.     Conjunctiva/sclera: Conjunctivae normal.     Pupils: Pupils are equal, round, and reactive to light.  Cardiovascular:     Rate and Rhythm: Normal rate and regular rhythm.     Heart sounds: No murmur heard.   Pulmonary:     Effort: Pulmonary effort is normal. No respiratory distress.     Breath sounds: Normal breath sounds.  Abdominal:     Palpations: Abdomen is soft.     Tenderness: There is no abdominal tenderness.  Musculoskeletal:        General: No swelling. Normal range of motion.     Cervical back: Normal range of motion and neck supple.  Skin:    General: Skin is warm and dry.  Neurological:     General: No focal deficit present.     Mental Status: He is alert and oriented to person, place, and time.     Cranial Nerves: No cranial nerve deficit.     Sensory: No sensory deficit.     Motor: No weakness.     Coordination: Coordination normal.     ED Results / Procedures / Treatments   Labs (all labs ordered are listed, but only abnormal results are displayed) Labs Reviewed  CBC WITH DIFFERENTIAL/PLATELET - Abnormal; Notable for the following components:      Result Value   Abs Immature Granulocytes 0.08 (*)    All other components within normal limits   COMPREHENSIVE METABOLIC PANEL - Abnormal; Notable for the following components:   Total Protein 6.4 (*)    All other components within normal limits  URINALYSIS, ROUTINE W REFLEX MICROSCOPIC - Abnormal; Notable for the following components:   APPearance HAZY (*)    All other components within normal limits  LIPASE, BLOOD  TSH  TROPONIN I (HIGH SENSITIVITY)  TROPONIN I (HIGH SENSITIVITY)    EKG EKG Interpretation  Date/Time:  Sunday March 18 2021 10:58:29 EDT Ventricular Rate:  62 PR Interval:  151 QRS Duration: 135 QT Interval:  457 QTC Calculation: 465 R Axis:   -81 Text Interpretation: 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 EXAM: CT HEAD WITHOUT CONTRAST TECHNIQUE: Contiguous axial images were obtained from the base of the skull through the vertex without intravenous contrast. COMPARISON:  CT head dated 03/19/2015. FINDINGS: Brain: No evidence of acute infarction, hemorrhage, hydrocephalus, extra-axial collection or mass lesion/mass effect. There is mild cerebral volume loss with associated ex vacuo dilatation. 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 Vascular: There are vascular calcifications in the carotid siphons. Skull: Normal. Negative for fracture or focal lesion. Sinuses/Orbits: No acute finding. Other: None. IMPRESSION: No acute intracranial process. Electronically Signed   By: Zerita Boers M.D.   On: 03/18/2021 14:42   DG Chest Port 1 View  Result Date: 03/18/2021 CLINICAL DATA:  Weakness. EXAM: PORTABLE CHEST 1 VIEW COMPARISON:  11/13/18 FINDINGS: The heart size and mediastinal contours are within normal limits. Both lungs are clear. The visualized skeletal structures are unremarkable. IMPRESSION: No active disease.  Electronically Signed   By: Kerby Moors M.D.   On: 03/18/2021 14:36    Procedures Procedures   Medications Ordered in ED Medications - No data to display  ED  Course  I have reviewed the triage vital signs and the nursing notes.  Pertinent labs & imaging results that were available during my care of the patient were reviewed by me and considered in my medical decision making (see chart for details).    MDM Rules/Calculators/A&P                         Work-up without evidence of any urinary tract infection leukocytosis or anemia.  Complete metabolic panel normal.  Lipase normal.  Troponin is 3.  Delta troponin not required because symptoms been ongoing at least since April 16.  And his thyroid-stimulating hormone is normal 2. CT head negative chest x-ray negative.  MRI not available here today.  We do not feel strongly that needs to be done acutely.  We will have patient follow-up with the [facility] clinic for further evaluation.  He will return for any new or worse symptoms.  And also the CT head showed no abnormalities in the sinuses.   Final Clinical Impression(s) / ED Diagnoses Final diagnoses:  Generalized weakness    Rx / DC Orders ED Discharge Orders    None       Fredia Sorrow, MD 03/18/21 1534

## 2021-03-18 NOTE — ED Notes (Signed)
Denies diarrhea, vomiting or decreased po intake. Just weak and tired.

## 2021-04-04 ENCOUNTER — Ambulatory Visit: Payer: No Typology Code available for payment source | Admitting: Nurse Practitioner

## 2021-04-05 ENCOUNTER — Other Ambulatory Visit: Payer: Self-pay

## 2021-04-05 ENCOUNTER — Ambulatory Visit (INDEPENDENT_AMBULATORY_CARE_PROVIDER_SITE_OTHER): Payer: Medicare Other | Admitting: Gastroenterology

## 2021-04-05 ENCOUNTER — Encounter: Payer: Self-pay | Admitting: Gastroenterology

## 2021-04-05 VITALS — BP 143/74 | HR 68 | Temp 96.8°F | Ht 70.0 in | Wt 212.0 lb

## 2021-04-05 DIAGNOSIS — K219 Gastro-esophageal reflux disease without esophagitis: Secondary | ICD-10-CM | POA: Diagnosis not present

## 2021-04-05 DIAGNOSIS — Z86010 Personal history of colonic polyps: Secondary | ICD-10-CM

## 2021-04-05 DIAGNOSIS — R1310 Dysphagia, unspecified: Secondary | ICD-10-CM | POA: Insufficient documentation

## 2021-04-05 NOTE — Progress Notes (Signed)
CC'ED TO PCP 

## 2021-04-05 NOTE — Patient Instructions (Signed)
Continue with Metamucil as you are doing. I am having you restart Linzess 72 micrograms, taking once each morning, 30 minutes before breakfast. Please let me know how this works for you, and we can send in a prescription or adjust as needed.  Continue omeprazole (Prilosec) but make sure to take on an empty stomach, 30 minutes before eating.  We are arranging a colonoscopy/endoscopy/dilation in August 2022.  We will see you back in November 2022!  It was a pleasure to see you today. I want to create trusting relationships with patients to provide genuine, compassionate, and quality care. I value your feedback. If you receive a survey regarding your visit,  I greatly appreciate you taking time to fill this out.   Annitta Needs, PhD, ANP-BC [facility]

## 2021-04-05 NOTE — Progress Notes (Addendum)
Primary Care Physician:  [name]  [facility]: [facility] Gastroenterologist:  Dr. Abbey Chatters  Chief Complaint  Patient presents with   Constipation   Gastroesophageal Reflux    HPI:   Roberto Sanchez. is a 75 y.o. male presenting today at the request of [facility] [place] for consideration of endoscopy. He has a remote history of EGD at least 20 years ago with dilation several times in remote past for stricture. Chronic GERD on omeprazole. He is actually due for one year surveillance colonoscopy Aug 2022 as last one in Aug 2021 had numerous polyps, with the larges 14 mm in cecum (Tubular adenomas). Family history of colon cancer in father in his 20s. Chronic constipation also noted.    GERD: on Prilosec. Not taking on an empty stomach all the time. Will have flares and then it gets better.Chronic pill dysphagia that waxes and wanes. No solid food dysphagia. No liquid dysphagia.   Taking Metamucil now and seems to be helping some. BM usually about every day but doesn't feel like he can completely empty his gut. Sometimes abdominal bloating. Good appetite. Has trialed Linzess 72 and 145 in the past but was not on fiber at that time and was not effective. Amitiza 8 mcg not affordable. Trulance pricey.   States he was told he had liver and lung lesion per the [place]. They are handling the liver biopsy and following the lung lesion. [facility] We do not have records but will request if possible.     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[place])    Rupture of right triceps tendon 09/01/2015    Past Surgical History:  Procedure Laterality Date   COLONOSCOPY WITH PROPOFOL N/A 07/14/2020   diverticulosis, 5 polyps (the largest of which was 14 mm in the cecum) and recommended repeat in 1 year (tubular adenomas).    KNEE ARTHROPLASTY Left  10/2020   KNEE ARTHROSCOPY Right 1980s   NASAL SINUS SURGERY     POLYPECTOMY  07/14/2020   Procedure: POLYPECTOMY;  Surgeon: Eloise Harman, DO;  [facility];  Service: Endoscopy;;   PROSTATE SURGERY     TONSILLECTOMY  age 11   TRICEPS TENDON REPAIR Right 09/01/2015   Procedure: RIGHT TRICEPS  REPAIR;  Surgeon: Marchia Bond, MD;  Location: [place];  Service: Orthopedics;  Laterality: Right;    Current Outpatient Medications  Medication Sig Dispense Refill   albuterol (VENTOLIN HFA) 108 (90 Base) MCG/ACT inhaler Inhale 1-2 puffs into the lungs as needed for wheezing or shortness of breath.      ALPRAZolam (XANAX) 0.5 MG tablet Take 0.5 mg by mouth at bedtime.      aspirin EC 81 MG tablet Take 81 mg by mouth at bedtime.     calcium carbonate (OSCAL) 1500 (600 Ca) MG TABS tablet Take 600 mg of elemental calcium by mouth daily with breakfast.     ezetimibe (ZETIA) 10 MG tablet Take 10 mg by mouth daily.     Fluticasone-Salmeterol (ADVAIR) 250-50 MCG/DOSE AEPB Inhale 1 puff into the lungs every 12 (twelve) hours.     hydrochlorothiazide (HYDRODIURIL) 25 MG tablet Take 25 mg by mouth daily.     hydroxypropyl methylcellulose / hypromellose (ISOPTO TEARS / GONIOVISC) 2.5 % ophthalmic solution  Place 1 drop into both eyes as needed for dry eyes.      losartan (COZAAR) 100 MG tablet Take 100 mg by mouth daily.     METAMUCIL FIBER PO Take by mouth daily. Takes 1 tablespoon daily     montelukast (SINGULAIR) 10 MG tablet Take 10 mg by mouth daily.     niacin 500 MG tablet Take 500 mg by mouth daily.      Omega-3 Fatty Acids (FISH OIL) 1000 MG CAPS Take 1,000 mg by mouth daily.      omeprazole (PRILOSEC) 20 MG capsule Take 20 mg by mouth daily.  3   No current facility-administered medications for this visit.    Allergies as of 04/05/2021 - Review Complete 04/05/2021  Allergen Reaction Noted   Penicillins Other (See Comments) 01/27/2012   Shellfish allergy Hives,  Shortness Of Breath, and Swelling 10/04/2011    Family History  Problem Relation Age of Onset   Asthma Maternal Uncle    Allergic rhinitis Brother    Allergic rhinitis Brother    Colon cancer Father 26   Angioedema Neg Hx    Eczema Neg Hx    Urticaria Neg Hx    Immunodeficiency Neg Hx     Social History   Socioeconomic History   Marital status: Married    Spouse name: Not on file   Number of children: Not on file   Years of education: Not on file   Highest education level: Not on file  Occupational History   Not on file  Tobacco Use   Smoking status: Former Smoker    Packs/day: 3.00    Years: 20.00    Pack years: 60.00    Types: Cigarettes    Quit date: 08/30/1983    Years since quitting: 37.6   Smokeless tobacco: Former Systems developer    Types: Chew    Quit date: 08/29/1985  Vaping Use   Vaping Use: Never used  Substance and Sexual Activity   Alcohol use: Yes    Comment: 3 to 5 beers weekly   Drug use: No   Sexual activity: Not on file  Other Topics Concern   Not on file  Social History Narrative   Not on file   Social Determinants of Health   Financial Resource Strain: Not on file  Food Insecurity: Not on file  Transportation Needs: Not on file  Physical Activity: Not on file  Stress: Not on file  Social Connections: Not on file  Intimate Partner Violence: Not on file    Review of Systems: Gen: Denies any fever, chills, fatigue, weight loss, lack of appetite.  CV: Denies chest pain, heart palpitations, peripheral edema, syncope.  Resp: Denies shortness of breath at rest or with exertion. Denies wheezing or cough.  GI: see HPI GU : Denies urinary burning, urinary frequency, urinary hesitancy MS: Denies joint pain, muscle weakness, cramps, or limitation of movement.  Derm: Denies rash, itching, dry skin Psych: Denies depression, anxiety, memory loss, and confusion Heme: Denies bruising, bleeding, and enlarged lymph nodes.  Physical Exam: BP (!) 143/74    Pulse 68   Temp (!) 96.8 F (36 C) (Temporal)   Ht 5\' 10"  (1.778 m)   Wt 212 lb (96.2 kg)   BMI 30.42 kg/m  General:   Alert and oriented. Pleasant and cooperative. Well-nourished and well-developed.  Head:  Normocephalic and atraumatic. Eyes:  Without icterus, sclera clear and conjunctiva pink.  Ears:  Normal auditory acuity. Mouth:  Mask in place Lungs:  Clear to auscultation bilaterally. No wheezes, rales, or rhonchi. No distress.  Heart:  S1, S2 present without murmurs appreciated.  Abdomen:  +BS, soft, non-tender and non-distended. No HSM noted. No guarding or rebound. No masses appreciated.  Rectal:  Deferred  Msk:  Symmetrical without gross deformities. Normal posture. Extremities:  Without edema. Neurologic:  Alert and  oriented x4;  grossly normal neurologically. Skin:  Intact without significant lesions or rashes. Psych:  Alert and cooperative. Normal mood and affect.  ASSESSMENT: Roberto Sanchez. is a 75 y.o. male presenting today with with history of chronic GERD, constipation, family history of colon cancer in father at age 81, and personal history of polyps with Aug 2021 colonoscopy revealing multiple polyps, the larges 14 mm in cecum (tubular adenomas). High risk surveillance due Aug 2022.   GERD: on omeprazole with flares occasionally. Notes pill dysphagia but no solid food dysphagia. EGD over 20 years ago with dilations in remote past. [facility], which I feel is certainly appropriate in light of chronic GERD and pill dysphagia. May have motility disorder but unable to exclude mild stricture.   Constipation: improved on Metamucil but still unproductive at times. Will add Linzess 72 mcg daily. Although this wasn't effective in past, he was not on fiber at that time. I feel he needs just a little "push" at this time as he notes overall improvement with fiber. Samples provided and he is to call with update.   Reported liver and lung lesion: states [facility] just found  this. We will request records if possible. [facility] is [facility].   PLAN:  Proceed with colonoscopy/EGD/dilation by Dr. Abbey Chatters  in near future: the risks, benefits, and alternatives have been discussed with the patient in detail. The patient states understanding and desires to proceed.   Omeprazole daily, 30 minutes before breakfast  Continue Metamucil, add Linzess 72 mcg, call with update  Return in Nov 2022  Attempt to retrieve outside records re: liver and lung lesion   Annitta Needs, PhD, ANP-BC [facility]   Addendum: received outside records. [place] May 2022 with fatty liver and benign hepatic cyst. No suspicious mass.

## 2021-04-16 ENCOUNTER — Telehealth: Payer: Self-pay | Admitting: *Deleted

## 2021-04-16 MED ORDER — LINACLOTIDE 72 MCG PO CAPS: 72.0000 ug | ORAL_CAPSULE | Freq: Every day | ORAL | 1 refills | Status: DC

## 2021-04-16 MED ORDER — LINACLOTIDE 72 MCG PO CAPS: 72.0000 ug | ORAL_CAPSULE | Freq: Every day | ORAL | 3 refills | Status: DC

## 2021-04-16 NOTE — Addendum Note (Signed)
Addended by: Annitta Needs on: 04/16/2021 03:01 PM   Modules accepted: Orders

## 2021-04-16 NOTE — Telephone Encounter (Signed)
Patient states linzess 21mcg has worked well for him. Wants Rx sent to [facility] in [place] for long term and a short term sent to [facility] in [place] until [place] can fill his Rx

## 2021-04-16 NOTE — Telephone Encounter (Signed)
Completed.

## 2021-04-19 ENCOUNTER — Ambulatory Visit: Payer: No Typology Code available for payment source | Admitting: Internal Medicine

## 2021-04-25 ENCOUNTER — Telehealth: Payer: Self-pay | Admitting: *Deleted

## 2021-04-25 NOTE — Telephone Encounter (Signed)
Spoke to pt and made him aware of recommendations.  He was made aware that he could add one capful of Miralax in 8 ounces of water each evening and see if it helps.  [place] but they said that it didn't require PA.

## 2021-04-25 NOTE — Telephone Encounter (Signed)
Unfortunately, I believe everything will be too pricey. Looking back at notes, Amitiza and Trulance were expensive, too.   I recommend continuing with fiber daily, titrating up as he can tolerate. He can add one capful of Miralax in 8 ounces of water each evening and see if this helps overall.   I am not sure if a PA needs to be done, but it still may be expensive out of pocket depending on insurance plan.

## 2021-04-25 NOTE — Telephone Encounter (Signed)
Pt said that Linzess is too expensive.  He wants to know if we can send something similar to [facility].

## 2021-05-16 ENCOUNTER — Telehealth: Payer: Self-pay

## 2021-05-16 ENCOUNTER — Other Ambulatory Visit: Payer: Self-pay

## 2021-05-16 DIAGNOSIS — R1310 Dysphagia, unspecified: Secondary | ICD-10-CM

## 2021-05-16 DIAGNOSIS — K219 Gastro-esophageal reflux disease without esophagitis: Secondary | ICD-10-CM

## 2021-05-16 DIAGNOSIS — Z86010 Personal history of colonic polyps: Secondary | ICD-10-CM

## 2021-05-16 MED ORDER — PEG 3350-KCL-NA BICARB-NACL 420 G PO SOLR: 4000.0000 mL | ORAL | 0 refills | Status: DC

## 2021-05-16 NOTE — Telephone Encounter (Signed)
Called pt, TCS/EGD/DIL ASA 2 w/Dr. Abbey Chatters scheduled for 06/26/21 at 8:30am. Informed pt to go to lab 06/22/21 for blood work. Rx for prep sent to [facility] per pt request. Orders entered. Lab letter and procedure instructions mailed.

## 2021-05-27 ENCOUNTER — Ambulatory Visit
Admission: EM | Admit: 2021-05-27 | Discharge: 2021-05-27 | Disposition: A | Discharge status: Home / Self Care | Payer: Medicare Other | Attending: Family Medicine | Admitting: Family Medicine

## 2021-05-27 ENCOUNTER — Other Ambulatory Visit: Payer: Self-pay

## 2021-05-27 DIAGNOSIS — J0140 Acute pansinusitis, unspecified: Secondary | ICD-10-CM | POA: Diagnosis not present

## 2021-05-27 MED ORDER — SULFAMETHOXAZOLE-TRIMETHOPRIM 800-160 MG PO TABS: 1.0000 | ORAL_TABLET | Freq: Two times a day (BID) | ORAL | 0 refills | Status: AC

## 2021-05-27 MED ORDER — DEXAMETHASONE SODIUM PHOSPHATE 10 MG/ML IJ SOLN
10.0000 mg | Freq: Once | INTRAMUSCULAR | Status: AC
  Administered 2021-05-27: 10 mg via INTRAMUSCULAR

## 2021-05-27 NOTE — ED Provider Notes (Signed)
[facility]    CSN: [id number] Arrival date & time: 05/27/21  1045      History   Chief Complaint Chief Complaint  Patient presents with   Facial Pain    HPI Roberto Sanchez. is a 75 y.o. male.   HPI Patient with a known history of recurrent sinusitis and asthma presents today with facial pressure and pain along with nasal congestion.  Symptoms have been present for nearly 2 weeks.  Patient is afebrile. Reports he has seen the ENT and they recommended reconstructive nasal surgery to reconstruct nasal cartilage as to resolve recurrent sinus infection. Patient averages a sinus every 3-4 months.  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[place])    Rupture of right triceps tendon 09/01/2015    Patient Active Problem List   Diagnosis Date Noted   Pill dysphagia 04/05/2021   History of colonic polyps 01/31/2021   Bloating 07/25/2020   GERD (gastroesophageal reflux disease) 07/25/2020   Constipation 04/20/2020   Family history of colon cancer 04/20/2020   Rupture of right triceps tendon 09/01/2015   Anxiety 03/20/2015   Asthma, chronic    Vertigo 03/19/2015   Essential hypertension 03/19/2015   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    KNEE ARTHROPLASTY Left 10/2020   KNEE ARTHROSCOPY Right 1980s   NASAL SINUS SURGERY     POLYPECTOMY  07/14/2020   Procedure: POLYPECTOMY;  Surgeon: Eloise Harman, DO;  [facility];  Service: Endoscopy;;   PROSTATE SURGERY      TONSILLECTOMY  age 79   TRICEPS TENDON REPAIR Right 09/01/2015   Procedure: RIGHT TRICEPS  REPAIR;  Surgeon: Marchia Bond, MD;  Location: [place];  Service: Orthopedics;  Laterality: Right;       Home Medications    Prior to Admission medications   Medication Sig Start Date End Date Taking? Authorizing Provider  sulfamethoxazole-trimethoprim (BACTRIM DS) 800-160 MG tablet Take 1 tablet by mouth 2 (two) times daily for 10 days. 05/27/21 06/06/21 Yes Scot Jun, FNP  albuterol (VENTOLIN HFA) 108 (90 Base) MCG/ACT inhaler Inhale 1-2 puffs into the lungs as needed for wheezing or shortness of breath.  03/16/08   [provider]  ALPRAZolam Duanne Moron) 0.5 MG tablet Take 0.5 mg by mouth at bedtime.     [provider]  aspirin EC 81 MG tablet Take 81 mg by mouth at bedtime.    [provider]  calcium carbonate (OSCAL) 1500 (600 Ca) MG TABS tablet Take 600 mg of elemental calcium by mouth daily with breakfast.    [provider]  ezetimibe (ZETIA) 10 MG tablet Take 10 mg by mouth daily.    [provider]  Fluticasone-Salmeterol (ADVAIR) 250-50 MCG/DOSE AEPB Inhale 1 puff into the lungs every 12 (twelve) hours.    [provider]  hydrochlorothiazide (HYDRODIURIL) 25 MG tablet Take 25 mg by mouth daily.    [provider]  hydroxypropyl methylcellulose / hypromellose (  ISOPTO TEARS / GONIOVISC) 2.5 % ophthalmic solution Place 1 drop into both eyes as needed for dry eyes.     [provider]  linaclotide Rolan Lipa) 72 MCG capsule Take 1 capsule (72 mcg total) by mouth daily before breakfast. 04/16/21   Annitta Needs, NP  linaclotide [facility]) 72 MCG capsule Take 1 capsule (72 mcg total) by mouth daily before breakfast. 04/16/21   Annitta Needs, NP  losartan (COZAAR) 100 MG tablet Take 100 mg by mouth daily.    [provider]  METAMUCIL FIBER PO Take by mouth daily. Takes 1 tablespoon daily    [provider]  montelukast (SINGULAIR) 10 MG tablet Take 10 mg by mouth daily. 03/18/15   [provider]  niacin 500 MG tablet Take 500 mg by mouth daily.     [provider]  Omega-3 Fatty Acids (FISH OIL) 1000 MG CAPS Take 1,000 mg by mouth daily.     [provider]  omeprazole (PRILOSEC) 20 MG capsule Take 20 mg by mouth daily. 01/27/15   [provider]  polyethylene glycol-electrolytes (TRILYTE) 420 g solution Take 4,000 mLs by mouth as directed. 05/16/21   Eloise Harman, DO    Family History Family History  Problem Relation Age of Onset   Asthma Maternal Uncle    Allergic rhinitis Brother    Allergic rhinitis Brother    Colon cancer Father 85   Angioedema Neg Hx    Eczema Neg Hx    Urticaria Neg Hx    Immunodeficiency Neg Hx     Social History Social History   Tobacco Use   Smoking status: Former    Packs/day: 3.00    Years: 20.00    Pack years: 60.00    Types: Cigarettes    Quit date: 08/30/1983    Years since quitting: 37.7   Smokeless tobacco: Former    Types: Chew    Quit date: 08/29/1985  Vaping Use   Vaping Use: Never used  Substance Use Topics   Alcohol use: Yes    Comment: 3 to 5 beers weekly   Drug use: No     Allergies   Penicillins and Shellfish allergy   Review of Systems Review of Systems Pertinent negatives listed in HPI  Physical Exam Triage Vital Signs ED Triage Vitals  Enc Vitals Group     BP 05/27/21 1054 (!) 145/86     Pulse Rate 05/27/21 1054 71     Resp 05/27/21 1054 16     Temp 05/27/21 1054 98.4 F (36.9 C)     Temp Source 05/27/21 1054 Oral     SpO2 05/27/21 1054 96 %     Weight --      Height --      Head Circumference --      Peak Flow --      Pain Score 05/27/21 1056 7     Pain Loc --      Pain Edu? --      Excl. in [place]? --    No data found.  Updated Vital Signs BP (!) 145/86 (BP Location: Right Arm)   Pulse 71   Temp 98.4 F (36.9 C) (Oral)   Resp 16   SpO2 96%    Visual Acuity Right Eye Distance:   Left Eye Distance:   Bilateral Distance:    Right Eye Near:   Left Eye Near:    Bilateral Near:     Physical Exam  General  Appearance:    Alert, cooperative, no distress  HENT:   Normocephalic, ears normal, nares mucosal edema with congestion, rhinorrhea, oropharynx normal   Eyes:    PERRL, conjunctiva/corneas clear, EOM's intact       Lungs:     Clear to auscultation bilaterally, respirations unlabored  Heart:    Regular rate and rhythm  Neurologic:   Awake, alert, oriented x 3. No apparent focal neurological defect.      UC Treatments / Results  Labs (all labs ordered are listed, but only abnormal results are displayed) Labs Reviewed - No data to display  EKG   Radiology No results found.  Procedures Procedures (including critical care time)  Medications Ordered in UC Medications  dexamethasone (DECADRON) injection 10 mg (has no administration in time range)    Initial Impression / Assessment and Plan / UC Course  I have reviewed the triage vital signs and the nursing notes.  Pertinent labs & imaging results that were available during my care of the patient were reviewed by me and considered in my medical decision making (see chart for details).     Acute sinusitis treating with Bactrim twice daily for 10 days.  Patient also given a dose of Decadron IM here in clinic. Patient advised to continue his current allergy regimen. Final Clinical Impressions(s) / UC Diagnoses   Final diagnoses:  Acute non-recurrent pansinusitis   Discharge Instructions   None    ED Prescriptions     Medication Sig Dispense Auth. Provider   sulfamethoxazole-trimethoprim (BACTRIM DS) 800-160 MG tablet Take 1 tablet by mouth 2 (two) times daily for 10 days. 20 tablet Scot Jun, FNP      PDMP not reviewed this encounter.   Scot Jun, FNP 05/27/21 1136

## 2021-05-27 NOTE — ED Triage Notes (Signed)
Patient presents to Urgent Care with complaints of headache and sinus pressure x 2 weeks. He states he has a hx of sinus infections. Treating pain with Tylenol.  Denies fever.

## 2021-06-25 ENCOUNTER — Other Ambulatory Visit (HOSPITAL_COMMUNITY)
Admission: RE | Admit: 2021-06-25 | Discharge: 2021-06-25 | Disposition: A | Discharge status: Home / Self Care | Payer: Medicare Other | Source: Ambulatory Visit | Attending: Internal Medicine | Admitting: Internal Medicine

## 2021-06-25 DIAGNOSIS — R1310 Dysphagia, unspecified: Secondary | ICD-10-CM | POA: Insufficient documentation

## 2021-06-25 DIAGNOSIS — Z86010 Personal history of colonic polyps: Secondary | ICD-10-CM | POA: Diagnosis present

## 2021-06-25 DIAGNOSIS — K219 Gastro-esophageal reflux disease without esophagitis: Secondary | ICD-10-CM | POA: Diagnosis present

## 2021-06-25 LAB — BASIC METABOLIC PANEL
Anion gap: 8 (ref 5–15)
BUN: 17 mg/dL (ref 8–23)
CO2: 28 mmol/L (ref 22–32)
Calcium: 9.1 mg/dL (ref 8.9–10.3)
Chloride: 100 mmol/L (ref 98–111)
Creatinine, Ser: 0.75 mg/dL (ref 0.61–1.24)
GFR, Estimated: >60 mL/min (ref >60)
Glucose, Bld: 106 mg/dL — ABNORMAL HIGH (ref 70–99)
Potassium: 4.5 mmol/L (ref 3.5–5.1)
Sodium: 136 mmol/L (ref 135–145)

## 2021-06-26 ENCOUNTER — Ambulatory Visit (HOSPITAL_COMMUNITY)
Admission: RE | Admit: 2021-06-26 | Discharge: 2021-06-26 | Disposition: A | Discharge status: Home / Self Care | Payer: Medicare Other | Attending: Internal Medicine | Admitting: Internal Medicine

## 2021-06-26 ENCOUNTER — Encounter (HOSPITAL_COMMUNITY)
Admission: RE | Disposition: A | Discharge status: Home / Self Care | Payer: Self-pay | Source: Home / Self Care | Attending: Internal Medicine

## 2021-06-26 ENCOUNTER — Other Ambulatory Visit: Payer: Self-pay

## 2021-06-26 ENCOUNTER — Encounter (HOSPITAL_COMMUNITY): Payer: Self-pay

## 2021-06-26 ENCOUNTER — Ambulatory Visit (HOSPITAL_COMMUNITY): Payer: Medicare Other | Admitting: Anesthesiology

## 2021-06-26 DIAGNOSIS — K222 Esophageal obstruction: Secondary | ICD-10-CM

## 2021-06-26 DIAGNOSIS — K648 Other hemorrhoids: Secondary | ICD-10-CM | POA: Insufficient documentation

## 2021-06-26 DIAGNOSIS — R1310 Dysphagia, unspecified: Secondary | ICD-10-CM | POA: Diagnosis not present

## 2021-06-26 DIAGNOSIS — Z800 Family history of malignant neoplasm of digestive organs: Secondary | ICD-10-CM | POA: Insufficient documentation

## 2021-06-26 DIAGNOSIS — K635 Polyp of colon: Secondary | ICD-10-CM | POA: Diagnosis not present

## 2021-06-26 DIAGNOSIS — R12 Heartburn: Secondary | ICD-10-CM | POA: Insufficient documentation

## 2021-06-26 DIAGNOSIS — Z7982 Long term (current) use of aspirin: Secondary | ICD-10-CM | POA: Insufficient documentation

## 2021-06-26 DIAGNOSIS — Z87891 Personal history of nicotine dependence: Secondary | ICD-10-CM | POA: Diagnosis not present

## 2021-06-26 DIAGNOSIS — D120 Benign neoplasm of cecum: Secondary | ICD-10-CM | POA: Diagnosis not present

## 2021-06-26 DIAGNOSIS — Z79899 Other long term (current) drug therapy: Secondary | ICD-10-CM | POA: Diagnosis not present

## 2021-06-26 DIAGNOSIS — Z86010 Personal history of colonic polyps: Secondary | ICD-10-CM

## 2021-06-26 DIAGNOSIS — K319 Disease of stomach and duodenum, unspecified: Secondary | ICD-10-CM | POA: Insufficient documentation

## 2021-06-26 DIAGNOSIS — K2970 Gastritis, unspecified, without bleeding: Secondary | ICD-10-CM

## 2021-06-26 DIAGNOSIS — Z09 Encounter for follow-up examination after completed treatment for conditions other than malignant neoplasm: Secondary | ICD-10-CM | POA: Diagnosis present

## 2021-06-26 DIAGNOSIS — K219 Gastro-esophageal reflux disease without esophagitis: Secondary | ICD-10-CM | POA: Diagnosis not present

## 2021-06-26 DIAGNOSIS — Z91013 Allergy to seafood: Secondary | ICD-10-CM | POA: Insufficient documentation

## 2021-06-26 DIAGNOSIS — K449 Diaphragmatic hernia without obstruction or gangrene: Secondary | ICD-10-CM | POA: Insufficient documentation

## 2021-06-26 DIAGNOSIS — K5909 Other constipation: Secondary | ICD-10-CM | POA: Insufficient documentation

## 2021-06-26 DIAGNOSIS — K5730 Diverticulosis of large intestine without perforation or abscess without bleeding: Secondary | ICD-10-CM | POA: Insufficient documentation

## 2021-06-26 DIAGNOSIS — Z8546 Personal history of malignant neoplasm of prostate: Secondary | ICD-10-CM | POA: Insufficient documentation

## 2021-06-26 HISTORY — PX: BALLOON DILATION: SHX5330

## 2021-06-26 HISTORY — PX: POLYPECTOMY: SHX5525

## 2021-06-26 HISTORY — PX: ESOPHAGOGASTRODUODENOSCOPY (EGD) WITH PROPOFOL: SHX5813

## 2021-06-26 HISTORY — PX: COLONOSCOPY WITH PROPOFOL: SHX5780

## 2021-06-26 SURGERY — COLONOSCOPY WITH PROPOFOL
Anesthesia: General

## 2021-06-26 MED ORDER — PROPOFOL 10 MG/ML IV BOLUS
INTRAVENOUS | Status: DC | PRN
  Administered 2021-06-26 (×2): 50 mg via INTRAVENOUS
  Administered 2021-06-26: 40 mg via INTRAVENOUS
  Administered 2021-06-26 (×2): 30 mg via INTRAVENOUS
  Administered 2021-06-26: 100 mg via INTRAVENOUS

## 2021-06-26 MED ORDER — PROPOFOL 10 MG/ML IV BOLUS
INTRAVENOUS | Status: AC
  Filled 2021-06-26: qty 60

## 2021-06-26 MED ORDER — LACTATED RINGERS IV SOLN
INTRAVENOUS | Status: DC
  Administered 2021-06-26: 1000 mL via INTRAVENOUS

## 2021-06-26 MED ORDER — EPHEDRINE 5 MG/ML INJ
INTRAVENOUS | Status: AC
  Filled 2021-06-26: qty 5

## 2021-06-26 MED ORDER — LIDOCAINE HCL (CARDIAC) PF 50 MG/5ML IV SOSY
PREFILLED_SYRINGE | INTRAVENOUS | Status: DC | PRN
  Administered 2021-06-26: 30 mg via INTRAVENOUS

## 2021-06-26 MED ORDER — OMEPRAZOLE 20 MG PO CPDR: 20.0000 mg | DELAYED_RELEASE_CAPSULE | Freq: Two times a day (BID) | ORAL | 5 refills | Status: DC

## 2021-06-26 MED ORDER — PROPOFOL 500 MG/50ML IV EMUL
INTRAVENOUS | Status: DC | PRN
  Administered 2021-06-26 (×2): 125 ug/kg/min via INTRAVENOUS

## 2021-06-26 MED ORDER — STERILE WATER FOR IRRIGATION IR SOLN
Status: DC | PRN
  Administered 2021-06-26: 200 mL

## 2021-06-26 MED ORDER — PROPOFOL 10 MG/ML IV BOLUS
INTRAVENOUS | Status: AC
  Filled 2021-06-26: qty 20

## 2021-06-26 NOTE — Anesthesia Preprocedure Evaluation (Signed)
Anesthesia Evaluation  Patient identified by MRN, date of birth, ID band Patient awake    Reviewed: Allergy & Precautions, NPO status , Patient's Chart, lab work & pertinent test results  History of Anesthesia Complications Negative for: history of anesthetic complications  Airway Mallampati: III  TM Distance: >3 FB Neck ROM: Full    Dental  (+) Dental Advisory Given, Missing   Pulmonary asthma , former smoker,    Pulmonary exam normal breath sounds clear to auscultation       Cardiovascular Exercise Tolerance: Good hypertension, Pt. on medications Normal cardiovascular exam Rhythm:Regular Rate:Normal     Neuro/Psych Anxiety negative neurological ROS     GI/Hepatic Neg liver ROS, GERD  Medicated,  Endo/Other  negative endocrine ROS  Renal/GU negative Renal ROS     Musculoskeletal  (+) Arthritis ,   Abdominal   Peds  Hematology negative hematology ROS (+)   Anesthesia Other Findings   Reproductive/Obstetrics                             Anesthesia Physical  Anesthesia Plan  ASA: 2  Anesthesia Plan: General   Post-op Pain Management:    Induction: Intravenous  PONV Risk Score and Plan:   Airway Management Planned: Nasal Cannula and Natural Airway  Additional Equipment:   Intra-op Plan:   Post-operative Plan:   Informed Consent: I have reviewed the patients History and Physical, chart, labs and discussed the procedure including the risks, benefits and alternatives for the proposed anesthesia with the patient or authorized representative who has indicated his/her understanding and acceptance.     Dental advisory given  Plan Discussed with: CRNA and Surgeon  Anesthesia Plan Comments:         Anesthesia Quick Evaluation

## 2021-06-26 NOTE — H&P (Signed)
Primary Care Physician:  [name] Primary Gastroenterologist:  Dr. Abbey Chatters  Pre-Procedure History & Physical: HPI:  Roberto Sanchez. is a 75 y.o. male is here for an EGD with possible dilation for GERD and dysphagia, as well as surveillance colonoscopy. Due for one year surveillance colonoscopy Aug 2022 as last one in Aug 2021 had numerous polyps, with the larges 14 mm in cecum (Tubular adenomas). Family history of colon cancer in father in his 19s. Chronic constipation also noted.    GERD: on Prilosec. Not taking on an empty stomach all the time. Will have flares and then it gets better.Chronic pill dysphagia that waxes and wanes. No solid food dysphagia. No liquid dysphagia.    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[place])    Rupture of right triceps tendon 09/01/2015    Past Surgical History:  Procedure Laterality Date   COLONOSCOPY WITH PROPOFOL N/A 07/14/2020   diverticulosis, 5 polyps (the largest of which was 14 mm in the cecum) and recommended repeat in 1 year (tubular adenomas).    KNEE ARTHROPLASTY Left 10/2020   KNEE ARTHROSCOPY Right 1980s   NASAL SINUS SURGERY     POLYPECTOMY  07/14/2020   Procedure: POLYPECTOMY;  Surgeon: Eloise Harman, DO;  [facility];  Service: Endoscopy;;   PROSTATE SURGERY     TONSILLECTOMY  age 41   TRICEPS TENDON REPAIR Right 09/01/2015   Procedure: RIGHT TRICEPS  REPAIR;  Surgeon: Marchia Bond, MD;  Location: [place];  Service: Orthopedics;  Laterality: Right;    Prior to Admission medications   Medication Sig Start Date End Date Taking? Authorizing Provider  albuterol (VENTOLIN HFA) 108 (90 Base) MCG/ACT inhaler Inhale 2 puffs into the lungs every 6 (six) hours as needed for wheezing or shortness of breath. 03/16/08  Yes [provider]  ALPRAZolam (XANAX) 0.5 MG tablet Take 0.25-0.5  mg by mouth See admin instructions. Take 0.5 mg at bedtime, may take a 0.25 mg dose as needed for anxiety   Yes [provider]  aspirin EC 81 MG tablet Take 81 mg by mouth at bedtime.   Yes [provider]  Calcium Carb-Cholecalciferol (CALCIUM 600 + D PO) Take 1 tablet by mouth daily.   Yes [provider]  colestipol (COLESTID) 1 g tablet Take 1 g by mouth 2 (two) times daily.   Yes [provider]  DULoxetine (CYMBALTA) 20 MG capsule Take 20 mg by mouth at bedtime.   Yes [provider]  ezetimibe (ZETIA) 10 MG tablet Take 10 mg by mouth daily.   Yes [provider]  fluticasone (FLONASE) 50 MCG/ACT nasal spray Place 1 spray into both nostrils daily as needed for allergies or rhinitis.   Yes [provider]  Fluticasone-Salmeterol (ADVAIR) 250-50 MCG/DOSE AEPB Inhale 1 puff into the lungs every 12 (twelve) hours.   Yes [provider]  guaifenesin (HUMIBID E) 400 MG TABS tablet Take 400 mg by mouth daily as needed (congestion).   Yes [provider]  hydrochlorothiazide (MICROZIDE) 12.5 MG capsule Take 12.5 mg by mouth daily.   Yes [provider]  hydroxypropyl methylcellulose / hypromellose (ISOPTO TEARS / GONIOVISC) 2.5 % ophthalmic solution Place 1 drop into both eyes as needed for dry eyes.    Yes [provider]  linaclotide Rolan Lipa)  72 MCG capsule Take 1 capsule (72 mcg total) by mouth daily before breakfast. 04/16/21  Yes Annitta Needs, NP  losartan (COZAAR) 100 MG tablet Take 100 mg by mouth at bedtime.   Yes [provider]  montelukast (SINGULAIR) 10 MG tablet Take 10 mg by mouth daily. 03/18/15  Yes [provider]  Multiple Vitamin (MULTIVITAMIN WITH MINERALS) TABS tablet Take 1 tablet by mouth daily.   Yes [provider]  niacin 500 MG tablet Take 500 mg by mouth daily.    Yes [provider]  Omega-3 Fatty Acids (FISH OIL) 1000 MG CAPS Take 1,000 mg  by mouth daily.    Yes [provider]  omeprazole (PRILOSEC) 20 MG capsule Take 20 mg by mouth daily before breakfast. 01/27/15  Yes [provider]  polyethylene glycol-electrolytes (TRILYTE) 420 g solution Take 4,000 mLs by mouth as directed. 05/16/21  Yes Eloise Harman, DO    Allergies as of 05/16/2021 - Review Complete 04/05/2021  Allergen Reaction Noted   Penicillins Other (See Comments) 01/27/2012   Shellfish allergy Hives, Shortness Of Breath, and Swelling 10/04/2011    Family History  Problem Relation Age of Onset   Asthma Maternal Uncle    Allergic rhinitis Brother    Allergic rhinitis Brother    Colon cancer Father 51   Angioedema Neg Hx    Eczema Neg Hx    Urticaria Neg Hx    Immunodeficiency Neg Hx     Social History   Socioeconomic History   Marital status: Married    Spouse name: Not on file   Number of children: Not on file   Years of education: Not on file   Highest education level: Not on file  Occupational History   Not on file  Tobacco Use   Smoking status: Former    Packs/day: 3.00    Years: 20.00    Pack years: 60.00    Types: Cigarettes    Quit date: 08/30/1983    Years since quitting: 37.8   Smokeless tobacco: Former    Types: Chew    Quit date: 08/29/1985  Vaping Use   Vaping Use: Never used  Substance and Sexual Activity   Alcohol use: Yes    Comment: 3 to 5 beers weekly   Drug use: No   Sexual activity: Not on file  Other Topics Concern   Not on file  Social History Narrative   Not on file   Social Determinants of Health   Financial Resource Strain: Not on file  Food Insecurity: Not on file  Transportation Needs: Not on file  Physical Activity: Not on file  Stress: Not on file  Social Connections: Not on file  Intimate Partner Violence: Not on file    Review of Systems: See HPI, otherwise negative ROS  Physical Exam: Vital signs in last 24 hours:     General:   Alert,  Well-developed, well-nourished,  pleasant and cooperative in NAD Head:  Normocephalic and atraumatic. Eyes:  Sclera clear, no icterus.   Conjunctiva pink. Ears:  Normal auditory acuity. Nose:  No deformity, discharge,  or lesions. Mouth:  No deformity or lesions, dentition normal. Neck:  Supple; no masses or thyromegaly. Lungs:  Clear throughout to auscultation.   No wheezes, crackles, or rhonchi. No acute distress. Heart:  Regular rate and rhythm; no murmurs, clicks, rubs,  or gallops. Abdomen:  Soft, nontender and nondistended. No masses, hepatosplenomegaly or hernias noted. Normal bowel sounds, without guarding, and without  rebound.   Msk:  Symmetrical without gross deformities. Normal posture. Extremities:  Without clubbing or edema. Neurologic:  Alert and  oriented x4;  grossly normal neurologically. Skin:  Intact without significant lesions or rashes. Cervical Nodes:  No significant cervical adenopathy. Psych:  Alert and cooperative. Normal mood and affect.  Impression/Plan: [name] is here for an EGD with possible dilation for GERD and dysphagia, as well as surveillance colonoscopy.  The risks of the procedure including infection, bleed, or perforation as well as benefits, limitations, alternatives and imponderables have been reviewed with the patient. Questions have been answered. All parties agreeable.

## 2021-06-26 NOTE — Discharge Instructions (Addendum)
EGD Discharge instructions Please read the instructions outlined below and refer to this sheet in the next few weeks. These discharge instructions provide you with general information on caring for yourself after you leave the hospital. Your doctor may also give you specific instructions. While your treatment has been planned according to the most current medical practices available, unavoidable complications occasionally occur. If you have any problems or questions after discharge, please call your doctor. ACTIVITY You may resume your regular activity but move at a slower pace for the next 24 hours.  Take frequent rest periods for the next 24 hours.  Walking will help expel (get rid of) the air and reduce the bloated feeling in your abdomen.  No driving for 24 hours (because of the anesthesia (medicine) used during the test).  You may shower.  Do not sign any important legal documents or operate any machinery for 24 hours (because of the anesthesia used during the test).  NUTRITION Drink plenty of fluids.  You may resume your normal diet.  Begin with a light meal and progress to your normal diet.  Avoid alcoholic beverages for 24 hours or as instructed by your caregiver.  MEDICATIONS You may resume your normal medications unless your caregiver tells you otherwise.  WHAT YOU CAN EXPECT TODAY You may experience abdominal discomfort such as a feeling of fullness or "gas" pains.  FOLLOW-UP Your doctor will discuss the results of your test with you.  SEEK IMMEDIATE MEDICAL ATTENTION IF ANY OF THE FOLLOWING OCCUR: Excessive nausea (feeling sick to your stomach) and/or vomiting.  Severe abdominal pain and distention (swelling).  Trouble swallowing.  Temperature over 101 F (37.8 C).  Rectal bleeding or vomiting of blood.    You had a moderate amount of inflammation in your stomach.  I took biopsies of this to rule infection with a bacteria called H. pylori.  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  I stretched this with a balloon.  We may need to stretch this further in the future.  I am going to increase your omeprazole to twice daily for the next 8 to 12 weeks.  Await pathology results, office will contact you.  Your colonoscopy revealed 3 polyp(s) which I removed successfully. Await pathology results, my office will contact you. I recommend repeating colonoscopy in 3 years for surveillance purposes. You also have diverticulosis and internal hemorrhoids. I would recommend increasing fiber in your diet or adding OTC Benefiber/Metamucil. Be sure to drink at least 4 to 6 glasses of water daily. Follow-up with GI as needed.   Follow-up with me in 3 to 4 months.  I hope you have a great rest of your week!  Elon Alas. Abbey Chatters, D.O. Gastroenterology and Hepatology [facility]

## 2021-06-26 NOTE — Op Note (Signed)
[facility] Patient Name: Roberto Sanchez Procedure Date: 06/26/2021 9:06 AM MRN: [id number] Date of Birth: February 15, 1946 Attending MD: Elon Alas. Edgar Frisk CSN: [id number] Age: 75 Admit Type: Outpatient Procedure:                Colonoscopy Indications:              High risk colon cancer surveillance: Personal                            history of colonic polyps Providers:                Elon Alas. Abbey Chatters, DO, Lurline Del, RN, Kristine L.                            Risa Grill, Technician Referring MD:              Medicines:                See the Anesthesia note for documentation of the                            administered medications Complications:            No immediate complications. Estimated Blood Loss:     Estimated blood loss was minimal. Procedure:                Pre-Anesthesia Assessment:                           - The anesthesia plan was to use monitored                            anesthesia care (MAC).                           After obtaining informed consent, the colonoscope                            was passed under direct vision. Throughout the                            procedure, the patient's blood pressure, pulse, and                            oxygen saturations were monitored continuously. The                            [id number]-HQ190L ([id number]) scope was introduced through                            the anus and advanced to the the cecum, identified                            by appendiceal orifice and ileocecal valve. The                            colonoscopy was performed without difficulty.  The                            patient tolerated the procedure well. The quality                            of the bowel preparation was evaluated using the                            BBPS [facility] Bowel Preparation Scale) with scores                            of: Right Colon = 2 (minor amount of residual                            staining, small fragments of stool  and/or opaque                            liquid, but mucosa seen well), Transverse Colon = 3                            (entire mucosa seen well with no residual staining,                            small fragments of stool or opaque liquid) and Left                            Colon = 3 (entire mucosa seen well with no residual                            staining, small fragments of stool or opaque                            liquid). The total BBPS score equals 8. The quality                            of the bowel preparation was fair. Scope In: 9:07:50 AM Scope Out: 9:26:06 AM Scope Withdrawal Time: 0 hours 14 minutes 27 seconds  Total Procedure Duration: 0 hours 18 minutes 16 seconds  Findings:      The perianal and digital rectal examinations were normal.      Non-bleeding internal hemorrhoids were found during endoscopy.      Multiple small and large-mouthed diverticula were found in the sigmoid       colon.      Two sessile polyps were found in the cecum. The polyps were 4 to 7 mm in       size. These polyps were removed with a hot snare. Resection and       retrieval were complete.      A 2 mm polyp was found in the cecum. The polyp was sessile. The polyp       was removed with a cold biopsy forceps. Resection and retrieval were       complete. Impression:               -  Preparation of the colon was fair.                           - Non-bleeding internal hemorrhoids.                           - Diverticulosis in the sigmoid colon.                           - Two 4 to 7 mm polyps in the cecum, removed with a                            hot snare. Resected and retrieved.                           - One 2 mm polyp in the cecum, removed with a cold                            biopsy forceps. Resected and retrieved. Moderate Sedation:      Per Anesthesia Care Recommendation:           - Patient has a contact number available for                            emergencies. The signs and  symptoms of potential                            delayed complications were discussed with the                            patient. Return to normal activities tomorrow.                            Written discharge instructions were provided to the                            patient.                           - Resume previous diet.                           - Continue present medications.                           - Await pathology results.                           - Repeat colonoscopy in 3 years for surveillance.                           - Return to GI clinic PRN. Procedure Code(s):        --- Professional ---                           [phone], Colonoscopy, flexible; with removal  of                            tumor(s), polyp(s), or other lesion(s) by snare                            technique                           45380, 59, Colonoscopy, flexible; with biopsy,                            single or multiple Diagnosis Code(s):        --- Professional ---                           K63.5, Polyp of colon                           Z86.010, Personal history of colonic polyps                           K64.8, Other hemorrhoids                           K57.30, Diverticulosis of large intestine without                            perforation or abscess without bleeding CPT copyright 2019 American Medical Association. All rights reserved. The codes documented in this report are preliminary and upon coder review may  be revised to meet current compliance requirements. Elon Alas. Abbey Chatters, DO [place] Abbey Chatters, DO 06/26/2021 9:30:49 AM This report has been signed electronically. Number of Addenda: 0

## 2021-06-26 NOTE — Anesthesia Postprocedure Evaluation (Signed)
Anesthesia Post Note  Patient: Roberto Sanchez.  Procedure(s) Performed: COLONOSCOPY WITH PROPOFOL ESOPHAGOGASTRODUODENOSCOPY (EGD) WITH PROPOFOL BALLOON DILATION POLYPECTOMY  Patient location during evaluation: Endoscopy Anesthesia Type: General Level of consciousness: awake and alert and oriented Pain management: pain level controlled Vital Signs Assessment: post-procedure vital signs reviewed and stable Respiratory status: spontaneous breathing and respiratory function stable Cardiovascular status: blood pressure returned to baseline and stable Postop Assessment: no apparent nausea or vomiting Anesthetic complications: no   No notable events documented.   Last Vitals:  Vitals:   06/26/21 0933 06/26/21 0935  BP: 92/71 125/69  Pulse: 64 66  Resp: 20 20  Temp: (!) 36.4 C   SpO2: 99% 99%    Last Pain:  Vitals:   06/26/21 0933  TempSrc: Oral  PainSc: 0-No pain                 [name]

## 2021-06-26 NOTE — Op Note (Signed)
[facility] Patient Name: Roberto Sanchez Procedure Date: 06/26/2021 8:48 AM MRN: [id number] Date of Birth: [birth year] Attending MD: Elon Alas. Abbey Chatters DO CSN: [id number] Age: 75 Admit Type: Outpatient Procedure:                Upper GI endoscopy Indications:              Dysphagia, Heartburn Providers:                Elon Alas. Abbey Chatters, DO, Lurline Del, RN, Kristine L.                            Risa Grill, Technician Referring MD:              Medicines:                See the Anesthesia note for documentation of the                            administered medications Complications:            No immediate complications. Estimated Blood Loss:     Estimated blood loss was minimal. Procedure:                Pre-Anesthesia Assessment:                           - The anesthesia plan was to use monitored                            anesthesia care (MAC).                           After obtaining informed consent, the endoscope was                            passed under direct vision. Throughout the                            procedure, the patient's blood pressure, pulse, and                            oxygen saturations were monitored continuously. The                            [id number]-H190 ([id number]) scope was introduced through the                            mouth, and advanced to the second part of duodenum.                            The upper GI endoscopy was accomplished without                            difficulty. The patient tolerated the procedure                            well. Scope In: 8:33:82  AM Scope Out: 9:02:50 AM Total Procedure Duration: 0 hours 6 minutes 51 seconds  Findings:      A moderate Schatzki ring was found in the lower third of the esophagus.       Bostructing with 12-13 mm diameter. A TTS dilator was passed through the       scope. Dilation with a 15-16.5-18 mm balloon dilator was performed to 15       mm. The dilation site was examined and showed mild  mucosal disruption       and moderate improvement in luminal narrowing.      A small hiatal hernia was present.      Localized moderate inflammation characterized by erythema and linear       erosions was found in the gastric antrum. Biopsies were taken with a       cold forceps for Helicobacter pylori testing.      The duodenal bulb, first portion of the duodenum and second portion of       the duodenum were normal. Impression:               - Moderate Schatzki ring. Dilated.                           - Small hiatal hernia.                           - Gastritis. Biopsied.                           - Normal duodenal bulb, first portion of the                            duodenum and second portion of the duodenum. Moderate Sedation:      Per Anesthesia Care Recommendation:           - Patient has a contact number available for                            emergencies. The signs and symptoms of potential                            delayed complications were discussed with the                            patient. Return to normal activities tomorrow.                            Written discharge instructions were provided to the                            patient.                           - Continue present medications.                           - Resume previous diet.                           -  Use Prilosec (omeprazole) 20 mg PO BID for 8-12                            weeks then decrease back down to once daily.                           - Repeat upper endoscopy PRN for retreatment.                           - Return to GI office in 3 months. Procedure Code(s):        --- Professional ---                           [phone], Esophagogastroduodenoscopy, flexible,                            transoral; with transendoscopic balloon dilation of                            esophagus (less than 30 mm diameter)                           43239, 59, Esophagogastroduodenoscopy, flexible,                             transoral; with biopsy, single or multiple Diagnosis Code(s):        --- Professional ---                           K22.2, Esophageal obstruction                           K44.9, Diaphragmatic hernia without obstruction or                            gangrene                           K29.70, Gastritis, unspecified, without bleeding                           R13.10, Dysphagia, unspecified                           R12, Heartburn CPT copyright 2019 American Medical Association. All rights reserved. The codes documented in this report are preliminary and upon coder review may  be revised to meet current compliance requirements. Elon Alas. Abbey Chatters, DO [place] Abbey Chatters, DO 06/26/2021 9:07:04 AM This report has been signed electronically. Number of Addenda: 0

## 2021-06-26 NOTE — Transfer of Care (Signed)
Immediate Anesthesia Transfer of Care Note  Patient: Roberto Sanchez.  Procedure(s) Performed: COLONOSCOPY WITH PROPOFOL ESOPHAGOGASTRODUODENOSCOPY (EGD) WITH PROPOFOL BALLOON DILATION POLYPECTOMY  Patient Location: Endoscopy Unit  Anesthesia Type:General  Level of Consciousness: awake and patient cooperative  Airway & Oxygen Therapy: Patient Spontanous Breathing  Post-op Assessment: Report given to RN and Post -op Vital signs reviewed and stable  Post vital signs: Reviewed and stable  Last Vitals:  Vitals Value Taken Time  BP    Temp    Pulse    Resp    SpO2      Last Pain:  Vitals:   06/26/21 0812  TempSrc: Oral  PainSc: 0-No pain      Patients Stated Pain Goal: 7 ([id number])  Complications: No notable events documented.

## 2021-06-27 ENCOUNTER — Other Ambulatory Visit: Payer: Self-pay | Admitting: Gastroenterology

## 2021-06-27 LAB — SURGICAL PATHOLOGY

## 2021-06-27 MED ORDER — OMEPRAZOLE 20 MG PO CPDR: 20.0000 mg | DELAYED_RELEASE_CAPSULE | Freq: Two times a day (BID) | ORAL | 5 refills | Status: AC

## 2021-07-04 ENCOUNTER — Encounter (HOSPITAL_COMMUNITY): Payer: Self-pay | Admitting: Internal Medicine

## 2021-07-23 ENCOUNTER — Other Ambulatory Visit: Payer: Self-pay

## 2021-07-23 ENCOUNTER — Encounter (HOSPITAL_COMMUNITY): Payer: Self-pay

## 2021-07-23 ENCOUNTER — Emergency Department (HOSPITAL_COMMUNITY)
Admission: EM | Admit: 2021-07-23 | Discharge: 2021-07-23 | Disposition: A | Discharge status: Home / Self Care | Payer: No Typology Code available for payment source | Attending: Emergency Medicine | Admitting: Emergency Medicine

## 2021-07-23 DIAGNOSIS — Z7952 Long term (current) use of systemic steroids: Secondary | ICD-10-CM | POA: Diagnosis not present

## 2021-07-23 DIAGNOSIS — T07XXXA Unspecified multiple injuries, initial encounter: Secondary | ICD-10-CM

## 2021-07-23 DIAGNOSIS — S80811A Abrasion, right lower leg, initial encounter: Secondary | ICD-10-CM | POA: Diagnosis not present

## 2021-07-23 DIAGNOSIS — Z8546 Personal history of malignant neoplasm of prostate: Secondary | ICD-10-CM | POA: Diagnosis not present

## 2021-07-23 DIAGNOSIS — I10 Essential (primary) hypertension: Secondary | ICD-10-CM | POA: Diagnosis not present

## 2021-07-23 DIAGNOSIS — S50312A Abrasion of left elbow, initial encounter: Secondary | ICD-10-CM | POA: Insufficient documentation

## 2021-07-23 DIAGNOSIS — Z96652 Presence of left artificial knee joint: Secondary | ICD-10-CM | POA: Insufficient documentation

## 2021-07-23 DIAGNOSIS — Z7982 Long term (current) use of aspirin: Secondary | ICD-10-CM | POA: Insufficient documentation

## 2021-07-23 DIAGNOSIS — S51811A Laceration without foreign body of right forearm, initial encounter: Secondary | ICD-10-CM | POA: Insufficient documentation

## 2021-07-23 DIAGNOSIS — J45909 Unspecified asthma, uncomplicated: Secondary | ICD-10-CM | POA: Insufficient documentation

## 2021-07-23 DIAGNOSIS — Z87891 Personal history of nicotine dependence: Secondary | ICD-10-CM | POA: Diagnosis not present

## 2021-07-23 DIAGNOSIS — W1839XA Other fall on same level, initial encounter: Secondary | ICD-10-CM | POA: Insufficient documentation

## 2021-07-23 DIAGNOSIS — S59911A Unspecified injury of right forearm, initial encounter: Secondary | ICD-10-CM | POA: Diagnosis present

## 2021-07-23 DIAGNOSIS — W19XXXA Unspecified fall, initial encounter: Secondary | ICD-10-CM

## 2021-07-23 MED ORDER — ACETAMINOPHEN 500 MG PO TABS
1000.0000 mg | ORAL_TABLET | Freq: Once | ORAL | Status: AC
  Administered 2021-07-23: 1000 mg via ORAL
  Filled 2021-07-23: qty 2

## 2021-07-23 NOTE — Discharge Instructions (Signed)
It was our pleasure to provide your ER care today - we hope that you feel better.  Keep abrasions very clean - wash with soap and water 1-2x/day.   You may apply thin coat of bacitracin to areas for the next few days.   Take acetaminophen or ibuprofen as need.   From our records, it indicates your tetanus was updated in 2018.   Return to ER if worse, new symptoms, new, worsening or severe pain, infection of wounds, or other concern.

## 2021-07-23 NOTE — ED Notes (Signed)
Pt provided discharge instructions and prescription information. Pt was given the opportunity to ask questions and questions were answered. Discharge signature not obtained in the setting of the COVID-19 pandemic in order to reduce high touch surfaces.  ° °
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
## 2021-09-03 ENCOUNTER — Other Ambulatory Visit: Payer: Self-pay

## 2021-09-03 ENCOUNTER — Ambulatory Visit: Payer: Medicare Other

## 2021-09-03 ENCOUNTER — Ambulatory Visit (INDEPENDENT_AMBULATORY_CARE_PROVIDER_SITE_OTHER): Payer: Medicare Other

## 2021-09-03 ENCOUNTER — Ambulatory Visit
Admission: EM | Admit: 2021-09-03 | Discharge: 2021-09-03 | Disposition: A | Discharge status: Home / Self Care | Payer: No Typology Code available for payment source | Attending: Urgent Care | Admitting: Urgent Care

## 2021-09-03 DIAGNOSIS — U071 COVID-19: Secondary | ICD-10-CM

## 2021-09-03 DIAGNOSIS — R059 Cough, unspecified: Secondary | ICD-10-CM | POA: Diagnosis not present

## 2021-09-03 DIAGNOSIS — J3089 Other allergic rhinitis: Secondary | ICD-10-CM

## 2021-09-03 DIAGNOSIS — Z8709 Personal history of other diseases of the respiratory system: Secondary | ICD-10-CM

## 2021-09-03 MED ORDER — MOLNUPIRAVIR EUA 200MG CAPSULE: 4.0000 | ORAL_CAPSULE | Freq: Two times a day (BID) | ORAL | 0 refills | Status: AC

## 2021-09-03 NOTE — Discharge Instructions (Addendum)
We will manage this as a viral syndrome. For sore throat or cough try using a honey-based tea. Use 3 teaspoons of honey with juice squeezed from half lemon. Place shaved pieces of ginger into 1/2-1 cup of water and warm over stove top. Then mix the ingredients and repeat every 4 hours as needed. Please take Tylenol 500mg -650mg  once every 6 hours for fevers, aches and pains. Hydrate very well with at least 2 liters (64 ounces) of water. Eat light meals such as soups (chicken and noodles, chicken wild rice, vegetable).  Do not eat any foods that you are allergic to.  Start an antihistamine like Zyrtec for postnasal drainage, sinus congestion.  You can take this together with pseudoephedrine (Sudafed) at a dose of 60 mg 3 times a day or twice daily as needed for the same kind of congestion.  However, limit your use of pseudoephedrine if you have high blood pressure or avoid altogether if you have abnormal heart rhythms, heart condition.

## 2021-09-03 NOTE — ED Provider Notes (Signed)
[facility]   MRN: [id number] DOB: [birth year]  Subjective:   Roberto Sanchez. is a 75 y.o. male presenting for 3-day history of persistent fatigue, cough, tension headaches.  Patient tested positive for COVID-19.  Had the [employer] vaccine.  No chest pain, shortness of breath, wheezing.  Patient has a history of asthma, uses his inhalers regularly like he supposed to.  He is not a smoker.  Would like COVID antivirals.  No current facility-administered medications for this encounter.  Current Outpatient Medications:    albuterol (VENTOLIN HFA) 108 (90 Base) MCG/ACT inhaler, Inhale 2 puffs into the lungs every 6 (six) hours as needed for wheezing or shortness of breath., Disp: , Rfl:    ALPRAZolam (XANAX) 0.5 MG tablet, Take 0.25-0.5 mg by mouth See admin instructions. Take 0.5 mg at bedtime, may take a 0.25 mg dose as needed for anxiety, Disp: , Rfl:    aspirin EC 81 MG tablet, Take 81 mg by mouth at bedtime., Disp: , Rfl:    Calcium Carb-Cholecalciferol (CALCIUM 600 + D PO), Take 1 tablet by mouth daily., Disp: , Rfl:    colestipol (COLESTID) 1 g tablet, Take 1 g by mouth 2 (two) times daily., Disp: , Rfl:    DULoxetine (CYMBALTA) 20 MG capsule, Take 20 mg by mouth at bedtime., Disp: , Rfl:    fluticasone (FLONASE) 50 MCG/ACT nasal spray, Place 1 spray into both nostrils daily as needed for allergies or rhinitis., Disp: , Rfl:    Fluticasone-Salmeterol (ADVAIR) 250-50 MCG/DOSE AEPB, Inhale 1 puff into the lungs every 12 (twelve) hours., Disp: , Rfl:    hydrochlorothiazide (MICROZIDE) 12.5 MG capsule, Take 12.5 mg by mouth daily., Disp: , Rfl:    hydroxypropyl methylcellulose / hypromellose (ISOPTO TEARS / GONIOVISC) 2.5 % ophthalmic solution, Place 1 drop into both eyes as needed for dry eyes. , Disp: , Rfl:    losartan (COZAAR) 100 MG tablet, Take 100 mg by mouth at bedtime., Disp: , Rfl:    montelukast (SINGULAIR) 10 MG tablet, Take 10 mg by mouth daily., Disp: ,  Rfl:    Multiple Vitamin (MULTIVITAMIN WITH MINERALS) TABS tablet, Take 1 tablet by mouth daily., Disp: , Rfl:    niacin 500 MG tablet, Take 500 mg by mouth daily. , Disp: , Rfl:    Omega-3 Fatty Acids (FISH OIL) 1000 MG CAPS, Take 1,000 mg by mouth daily. , Disp: , Rfl:    omeprazole (PRILOSEC) 20 MG capsule, Take 1 capsule (20 mg total) by mouth 2 (two) times daily before a meal., Disp: 60 capsule, Rfl: 5   ezetimibe (ZETIA) 10 MG tablet, Take 10 mg by mouth daily., Disp: , Rfl:    guaifenesin (HUMIBID E) 400 MG TABS tablet, Take 400 mg by mouth daily as needed (congestion)., Disp: , Rfl:    linaclotide (LINZESS) 72 MCG capsule, Take 1 capsule (72 mcg total) by mouth daily before breakfast., Disp: 30 capsule, Rfl: 1   polyethylene glycol-electrolytes (TRILYTE) 420 g solution, Take 4,000 mLs by mouth as directed., Disp: 4000 mL, Rfl: 0   Allergies  Allergen Reactions   Penicillins Other (See Comments)    Causes patient to "pass out"DID THE REACTION INVOLVE: Swelling of the face/tongue/throat, SOB, or low BP? No Sudden or severe rash/hives, skin peeling, or the inside of the mouth or nose? No Did it require medical treatment? No When did it last happen?       If all above answers are "NO", may proceed  with cephalosporin use.    Shellfish Allergy Hives, Shortness Of Breath and Swelling    Facial Swelling  ears turn red   Atorvastatin Other (See Comments)    Muscle pain and muscle weakness   Simvastatin     Muscle pain and muscle weakness   Codeine Nausea And Vomiting    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[place])    Rupture of right triceps tendon 09/01/2015     Past Surgical History:  Procedure Laterality Date   BALLOON DILATION N/A 06/26/2021   Procedure: BALLOON DILATION;  Surgeon: Eloise Harman, DO;  [facility];  Service: Endoscopy;  Laterality: N/A;    COLONOSCOPY WITH PROPOFOL N/A 07/14/2020   diverticulosis, 5 polyps (the largest of which was 14 mm in the cecum) and recommended repeat in 1 year (tubular adenomas).    COLONOSCOPY WITH PROPOFOL N/A 06/26/2021   Procedure: COLONOSCOPY WITH PROPOFOL;  Surgeon: Eloise Harman, DO;  [facility];  Service: Endoscopy;  Laterality: N/A;  8:30am   ESOPHAGOGASTRODUODENOSCOPY (EGD) WITH PROPOFOL N/A 06/26/2021   Procedure: ESOPHAGOGASTRODUODENOSCOPY (EGD) WITH PROPOFOL;  Surgeon: Eloise Harman, DO;  [facility];  Service: Endoscopy;  Laterality: N/A;   KNEE ARTHROPLASTY Left 10/2020   KNEE ARTHROSCOPY Right 1980s   NASAL SINUS SURGERY     POLYPECTOMY  07/14/2020   Procedure: POLYPECTOMY;  Surgeon: Eloise Harman, DO;  [facility];  Service: Endoscopy;;   POLYPECTOMY  06/26/2021   Procedure: POLYPECTOMY;  Surgeon: Eloise Harman, DO;  [facility];  Service: Endoscopy;;   PROSTATE SURGERY     TONSILLECTOMY  age 62   TRICEPS TENDON REPAIR Right 09/01/2015   Procedure: RIGHT TRICEPS  REPAIR;  Surgeon: Marchia Bond, MD;  Location: [place];  Service: Orthopedics;  Laterality: Right;    Family History  Problem Relation Age of Onset   Asthma Maternal Uncle    Allergic rhinitis Brother    Allergic rhinitis Brother    Colon cancer Father 34   Angioedema Neg Hx    Eczema Neg Hx    Urticaria Neg Hx    Immunodeficiency Neg Hx     Social History   Tobacco Use   Smoking status: Former    Packs/day: 3.00    Years: 20.00    Pack years: 60.00    Types: Cigarettes    Quit date: 08/30/1983    Years since quitting: 38.0   Smokeless tobacco: Former    Types: Chew    Quit date: 08/29/1985  Vaping Use   Vaping Use: Never used  Substance Use Topics   Alcohol use: Yes    Comment: 3 to 5 beers weekly   Drug use: No    ROS   Objective:   Vitals: BP (!) 150/87 (BP Location: Right Arm)   Pulse 66   Temp 97.8 F (36.6 C) (Oral)    Resp 20   SpO2 95%   Physical Exam Constitutional:      General: He is not in acute distress.    Appearance: Normal appearance. He is well-developed. He is not ill-appearing, toxic-appearing or diaphoretic.  HENT:     Head: Normocephalic and atraumatic.     Right Ear: External ear normal.     Left Ear: External ear normal.  Nose: Nose normal.     Mouth/Throat:     Mouth: Mucous membranes are moist.     Pharynx: Oropharynx is clear.  Eyes:     General: No scleral icterus.    Extraocular Movements: Extraocular movements intact.     Pupils: Pupils are equal, round, and reactive to light.  Cardiovascular:     Rate and Rhythm: Normal rate and regular rhythm.     Heart sounds: Normal heart sounds. No murmur heard.   No friction rub. No gallop.  Pulmonary:     Effort: Pulmonary effort is normal. No respiratory distress.     Breath sounds: Normal breath sounds. No stridor. No wheezing, rhonchi or rales.  Neurological:     Mental Status: He is alert and oriented to person, place, and time.     Cranial Nerves: No cranial nerve deficit.     Motor: No weakness.     Coordination: Coordination normal.     Gait: Gait normal.     Deep Tendon Reflexes: Reflexes normal.  Psychiatric:        Mood and Affect: Mood normal.        Behavior: Behavior normal.        Thought Content: Thought content normal.        Judgment: Judgment normal.    DG Chest 2 View  Result Date: 09/03/2021 CLINICAL DATA:  Cough. EXAM: CHEST - 2 VIEW COMPARISON:  March 18, 2021. FINDINGS: The heart size and mediastinal contours are within normal limits. Both lungs are clear. The visualized skeletal structures are unremarkable. IMPRESSION: No active cardiopulmonary disease. Electronically Signed   By: Marijo Conception M.D.   On: 09/03/2021 16:56     Assessment and Plan :   PDMP not reviewed this encounter.  1. COVID-19   2. History of asthma   3. Allergic rhinitis due to other allergic trigger, unspecified  seasonality     Low suspicion for an acute encephalopathy.  Recommended conservative management, supportive care.  Will defer ER visit for head CT given neurologic exam. Start molnupiravir. Counseled patient on potential for adverse effects with medications prescribed/recommended today, ER and return-to-clinic precautions discussed, patient verbalized understanding.    Jaynee Eagles, PA-C 09/03/21 1719

## 2021-09-03 NOTE — ED Triage Notes (Signed)
Pt reports HA, cough, fatigue x 3 days.  Home COVID test positive x 2.  Had [employer] vaccine.

## 2021-09-19 ENCOUNTER — Institutional Professional Consult (permissible substitution): Payer: No Typology Code available for payment source | Admitting: Pulmonary Disease

## 2021-09-19 NOTE — Progress Notes (Incomplete)
Synopsis: Referred in *** for *** by Clinic, [name]  Subjective:   PATIENT ID: Roberto Sanchez. GENDER: male DOB: [birth year], MRN: [id number]  No chief complaint on file.   HPI  ***  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[facility])    Rupture of right triceps tendon 09/01/2015     Family History  Problem Relation Age of Onset   Asthma Maternal Uncle    Allergic rhinitis Brother    Allergic rhinitis Brother    Colon cancer Father 57   Angioedema Neg Hx    Eczema Neg Hx    Urticaria Neg Hx    Immunodeficiency Neg Hx      Past Surgical History:  Procedure Laterality Date   BALLOON DILATION N/A 06/26/2021   Procedure: BALLOON DILATION;  Surgeon: Eloise Harman, DO;  [facility];  Service: Endoscopy;  Laterality: N/A;   COLONOSCOPY WITH PROPOFOL N/A 07/14/2020   diverticulosis, 5 polyps (the largest of which was 14 mm in the cecum) and recommended repeat in 1 year (tubular adenomas).    COLONOSCOPY WITH PROPOFOL N/A 06/26/2021   Procedure: COLONOSCOPY WITH PROPOFOL;  Surgeon: Eloise Harman, DO;  [facility];  Service: Endoscopy;  Laterality: N/A;  8:30am   ESOPHAGOGASTRODUODENOSCOPY (EGD) WITH PROPOFOL N/A 06/26/2021   Procedure: ESOPHAGOGASTRODUODENOSCOPY (EGD) WITH PROPOFOL;  Surgeon: Eloise Harman, DO;  [facility];  Service: Endoscopy;  Laterality: N/A;   KNEE ARTHROPLASTY Left 10/2020   KNEE ARTHROSCOPY Right 1980s   NASAL SINUS SURGERY     POLYPECTOMY  07/14/2020   Procedure: POLYPECTOMY;  Surgeon: Eloise Harman, DO;  [facility];  Service: Endoscopy;;   POLYPECTOMY  06/26/2021   Procedure: POLYPECTOMY;  Surgeon: Eloise Harman, DO;  [facility];  Service: Endoscopy;;   PROSTATE SURGERY     TONSILLECTOMY  age 60   TRICEPS TENDON REPAIR Right 09/01/2015    Procedure: RIGHT TRICEPS  REPAIR;  Surgeon: Marchia Bond, MD;  Location: [place];  Service: Orthopedics;  Laterality: Right;    Social History   Socioeconomic History   Marital status: Married    Spouse name: Not on file   Number of children: Not on file   Years of education: Not on file   Highest education level: Not on file  Occupational History   Not on file  Tobacco Use   Smoking status: Former    Packs/day: 3.00    Years: 20.00    Pack years: 60.00    Types: Cigarettes    Quit date: 08/30/1983    Years since quitting: 38.0   Smokeless tobacco: Former    Types: Chew    Quit date: 08/29/1985  Vaping Use   Vaping Use: Never used  Substance and Sexual Activity   Alcohol use: Yes    Comment: 3 to 5 beers weekly   Drug use: No   Sexual activity: Not on file  Other Topics Concern   Not on file  Social History Narrative   Not on file   Social Determinants of Health   Financial Resource Strain: Not on file  Food Insecurity: Not on file  Transportation Needs: Not on file  Physical Activity: Not on file  Stress: Not on file  Social Connections: Not on file  Intimate  Partner Violence: Not on file     Allergies  Allergen Reactions   Penicillins Other (See Comments)    Causes patient to "pass out"DID THE REACTION INVOLVE: Swelling of the face/tongue/throat, SOB, or low BP? No Sudden or severe rash/hives, skin peeling, or the inside of the mouth or nose? No Did it require medical treatment? No When did it last happen?       If all above answers are NO, may proceed with cephalosporin use.    Shellfish Allergy Hives, Shortness Of Breath and Swelling    Facial Swelling  ears turn red   Atorvastatin Other (See Comments)    Muscle pain and muscle weakness   Simvastatin     Muscle pain and muscle weakness   Codeine Nausea And Vomiting     Outpatient Medications Prior to Visit  Medication Sig Dispense Refill   albuterol  (VENTOLIN HFA) 108 (90 Base) MCG/ACT inhaler Inhale 2 puffs into the lungs every 6 (six) hours as needed for wheezing or shortness of breath.     ALPRAZolam (XANAX) 0.5 MG tablet Take 0.25-0.5 mg by mouth See admin instructions. Take 0.5 mg at bedtime, may take a 0.25 mg dose as needed for anxiety     aspirin EC 81 MG tablet Take 81 mg by mouth at bedtime.     Calcium Carb-Cholecalciferol (CALCIUM 600 + D PO) Take 1 tablet by mouth daily.     colestipol (COLESTID) 1 g tablet Take 1 g by mouth 2 (two) times daily.     DULoxetine (CYMBALTA) 20 MG capsule Take 20 mg by mouth at bedtime.     ezetimibe (ZETIA) 10 MG tablet Take 10 mg by mouth daily.     fluticasone (FLONASE) 50 MCG/ACT nasal spray Place 1 spray into both nostrils daily as needed for allergies or rhinitis.     Fluticasone-Salmeterol (ADVAIR) 250-50 MCG/DOSE AEPB Inhale 1 puff into the lungs every 12 (twelve) hours.     guaifenesin (HUMIBID E) 400 MG TABS tablet Take 400 mg by mouth daily as needed (congestion).     hydrochlorothiazide (MICROZIDE) 12.5 MG capsule Take 12.5 mg by mouth daily.     hydroxypropyl methylcellulose / hypromellose (ISOPTO TEARS / GONIOVISC) 2.5 % ophthalmic solution Place 1 drop into both eyes as needed for dry eyes.      linaclotide (LINZESS) 72 MCG capsule Take 1 capsule (72 mcg total) by mouth daily before breakfast. 30 capsule 1   losartan (COZAAR) 100 MG tablet Take 100 mg by mouth at bedtime.     montelukast (SINGULAIR) 10 MG tablet Take 10 mg by mouth daily.     Multiple Vitamin (MULTIVITAMIN WITH MINERALS) TABS tablet Take 1 tablet by mouth daily.     niacin 500 MG tablet Take 500 mg by mouth daily.      Omega-3 Fatty Acids (FISH OIL) 1000 MG CAPS Take 1,000 mg by mouth daily.      omeprazole (PRILOSEC) 20 MG capsule Take 1 capsule (20 mg total) by mouth 2 (two) times daily before a meal. 60 capsule 5   polyethylene glycol-electrolytes (TRILYTE) 420 g solution Take 4,000 mLs by mouth  as directed. 4000 mL 0   No facility-administered medications prior to visit.    ROS   Objective:  Physical Exam   There were no vitals filed for this visit.   on *** LPM *** RA BMI Readings from Last 3 Encounters:  07/23/21 30.13 kg/m  06/26/21 30.13 kg/m  04/05/21 30.42 kg/m  Wt Readings from Last 3 Encounters:  07/23/21 210 lb (95.3 kg)  06/26/21 210 lb (95.3 kg)  04/05/21 212 lb (96.2 kg)     CBC    Component Value Date/Time   WBC 10.2 03/18/2021 1320   RBC 4.31 03/18/2021 1320   HGB 14.2 03/18/2021 1320   HGB 13.3 07/17/2020 1608   HCT 42.0 03/18/2021 1320   HCT 37.9 07/17/2020 1608   PLT 390 03/18/2021 1320   PLT 343 07/17/2020 1608   MCV 97.4 03/18/2021 1320   MCV 96 07/17/2020 1608   MCH 32.9 03/18/2021 1320   MCHC 33.8 03/18/2021 1320   RDW 13.2 03/18/2021 1320   RDW 12.2 07/17/2020 1608   LYMPHSABS 2.0 03/18/2021 1320   LYMPHSABS 2.3 07/17/2020 1608   MONOABS 0.6 03/18/2021 1320   EOSABS 0.2 03/18/2021 1320   EOSABS 0.4 07/17/2020 1608   BASOSABS 0.1 03/18/2021 1320   BASOSABS 0.1 07/17/2020 1608    ***  Chest Imaging: ***  Pulmonary Functions Testing Results: No flowsheet data found.  FeNO: ***  Pathology: ***  Echocardiogram: ***  Heart Catheterization: ***    Assessment & Plan:   No diagnosis found.  Discussion: ***   Current Outpatient Medications:    albuterol (VENTOLIN HFA) 108 (90 Base) MCG/ACT inhaler, Inhale 2 puffs into the lungs every 6 (six) hours as needed for wheezing or shortness of breath., Disp: , Rfl:    ALPRAZolam (XANAX) 0.5 MG tablet, Take 0.25-0.5 mg by mouth See admin instructions. Take 0.5 mg at bedtime, may take a 0.25 mg dose as needed for anxiety, Disp: , Rfl:    aspirin EC 81 MG tablet, Take 81 mg by mouth at bedtime., Disp: , Rfl:    Calcium Carb-Cholecalciferol (CALCIUM 600 + D PO), Take 1 tablet by mouth daily., Disp: , Rfl:    colestipol (COLESTID) 1 g tablet, Take 1 g by mouth 2 (two)  times daily., Disp: , Rfl:    DULoxetine (CYMBALTA) 20 MG capsule, Take 20 mg by mouth at bedtime., Disp: , Rfl:    ezetimibe (ZETIA) 10 MG tablet, Take 10 mg by mouth daily., Disp: , Rfl:    fluticasone (FLONASE) 50 MCG/ACT nasal spray, Place 1 spray into both nostrils daily as needed for allergies or rhinitis., Disp: , Rfl:    Fluticasone-Salmeterol (ADVAIR) 250-50 MCG/DOSE AEPB, Inhale 1 puff into the lungs every 12 (twelve) hours., Disp: , Rfl:    guaifenesin (HUMIBID E) 400 MG TABS tablet, Take 400 mg by mouth daily as needed (congestion)., Disp: , Rfl:    hydrochlorothiazide (MICROZIDE) 12.5 MG capsule, Take 12.5 mg by mouth daily., Disp: , Rfl:    hydroxypropyl methylcellulose / hypromellose (ISOPTO TEARS / GONIOVISC) 2.5 % ophthalmic solution, Place 1 drop into both eyes as needed for dry eyes. , Disp: , Rfl:    linaclotide (LINZESS) 72 MCG capsule, Take 1 capsule (72 mcg total) by mouth daily before breakfast., Disp: 30 capsule, Rfl: 1   losartan (COZAAR) 100 MG tablet, Take 100 mg by mouth at bedtime., Disp: , Rfl:    montelukast (SINGULAIR) 10 MG tablet, Take 10 mg by mouth daily., Disp: , Rfl:    Multiple Vitamin (MULTIVITAMIN WITH MINERALS) TABS tablet, Take 1 tablet by mouth daily., Disp: , Rfl:    niacin 500 MG tablet, Take 500 mg by mouth daily. , Disp: , Rfl:    Omega-3 Fatty Acids (FISH OIL) 1000 MG CAPS, Take 1,000 mg by mouth daily. , Disp: , Rfl:  omeprazole (PRILOSEC) 20 MG capsule, Take 1 capsule (20 mg total) by mouth 2 (two) times daily before a meal., Disp: 60 capsule, Rfl: 5   polyethylene glycol-electrolytes (TRILYTE) 420 g solution, Take 4,000 mLs by mouth as directed., Disp: 4000 mL, Rfl: 0  I spent *** minutes dedicated to the care of this patient on the date of this encounter to include pre-visit review of records, face-to-face time with the patient discussing conditions above, post visit ordering of testing, clinical documentation with the electronic  health record, making appropriate referrals as documented, and communicating necessary findings to members of the patients care team.   Garner Nash, DO [facility] 09/19/2021 12:32 PM

## 2021-09-21 ENCOUNTER — Other Ambulatory Visit: Payer: Self-pay

## 2021-09-21 ENCOUNTER — Encounter: Payer: Self-pay | Admitting: Pulmonary Disease

## 2021-09-21 ENCOUNTER — Ambulatory Visit (INDEPENDENT_AMBULATORY_CARE_PROVIDER_SITE_OTHER): Payer: Medicare Other | Admitting: Pulmonary Disease

## 2021-09-21 VITALS — BP 134/82 | HR 62 | Temp 97.8°F | Ht 70.0 in | Wt 209.0 lb

## 2021-09-21 DIAGNOSIS — R911 Solitary pulmonary nodule: Secondary | ICD-10-CM

## 2021-09-21 DIAGNOSIS — J45909 Unspecified asthma, uncomplicated: Secondary | ICD-10-CM

## 2021-09-21 DIAGNOSIS — Z8616 Personal history of COVID-19: Secondary | ICD-10-CM

## 2021-09-21 DIAGNOSIS — Z23 Encounter for immunization: Secondary | ICD-10-CM | POA: Diagnosis not present

## 2021-09-21 NOTE — Patient Instructions (Addendum)
Thank you for visiting Dr. Valeta Harms at [facility]. Today we recommend the following:  Orders Placed This Encounter  Procedures   CT Super D Chest Wo Contrast   Continue wixela inhaler Albuterol as needed   Return in about 4 months (around 01/22/2022) for with Eric Form, NP, or Dr. Valeta Harms. After ct chest repeat     Please do your part to reduce the spread of COVID-19.

## 2021-09-21 NOTE — Progress Notes (Signed)
Synopsis: Referred in Oct 2022 for lung nodule by [name]  Subjective:   PATIENT ID: Roberto Sanchez. GENDER: male DOB: November 29, 1945, MRN: [id number]  Chief Complaint  Patient presents with   Consult    Pt is here today due to a spot on lung. Pt states he had covid about a month ago and since then, he has had an occ cough.    PMH asthma, gerd,htn, asthma, on wixela inhaler, well controlled and rarely ueses albuterol. He did have covid 4 weeks and has recovered.  From respiratory standpoint he is doing okay.  He still has a cough at times.  He states his back hurts that this afternoon after mowing his grass this morning but otherwise is able to complete all of his activities of daily living.  He has CT scan in August of this past year which revealed a 8 mm pulmonary nodule.  He has a few other smaller pulmonary nodular densities within the chest.  He smoked for many years at 3 packs/day.  He smoked for 20 years total, 60+ pack year history and quit 1984.   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HCC)    Rupture of right triceps tendon 09/01/2015     Family History  Problem Relation Age of Onset   Asthma Maternal Uncle    Allergic rhinitis Brother    Allergic rhinitis Brother    Colon cancer Father 33   Angioedema Neg Hx    Eczema Neg Hx    Urticaria Neg Hx    Immunodeficiency Neg Hx      Past Surgical History:  Procedure Laterality Date   BALLOON DILATION N/A 06/26/2021   Procedure: BALLOON DILATION;  Surgeon: Eloise Harman, DO;  [facility];  Service: Endoscopy;  Laterality: N/A;   COLONOSCOPY WITH PROPOFOL N/A 07/14/2020   diverticulosis, 5 polyps (the largest of which was 14 mm in the cecum) and recommended repeat in 1 year (tubular adenomas).    COLONOSCOPY WITH PROPOFOL N/A 06/26/2021   Procedure: COLONOSCOPY WITH PROPOFOL;  Surgeon:  Eloise Harman, DO;  [facility];  Service: Endoscopy;  Laterality: N/A;  8:30am   ESOPHAGOGASTRODUODENOSCOPY (EGD) WITH PROPOFOL N/A 06/26/2021   Procedure: ESOPHAGOGASTRODUODENOSCOPY (EGD) WITH PROPOFOL;  Surgeon: Eloise Harman, DO;  [facility];  Service: Endoscopy;  Laterality: N/A;   KNEE ARTHROPLASTY Left 10/2020   KNEE ARTHROSCOPY Right 1980s   NASAL SINUS SURGERY     POLYPECTOMY  07/14/2020   Procedure: POLYPECTOMY;  Surgeon: Eloise Harman, DO;  [facility];  Service: Endoscopy;;   POLYPECTOMY  06/26/2021   Procedure: POLYPECTOMY;  Surgeon: Eloise Harman, DO;  [facility];  Service: Endoscopy;;   PROSTATE SURGERY     TONSILLECTOMY  age 82   TRICEPS TENDON REPAIR Right 09/01/2015   Procedure: RIGHT TRICEPS  REPAIR;  Surgeon: Marchia Bond, MD;  Location: [place];  Service: Orthopedics;  Laterality: Right;    Social History   Socioeconomic History   Marital status: Married    Spouse name: Not on file   Number of children: Not on file   Years of education: Not on file   Highest education level: Not on file  Occupational History   Not on file  Tobacco Use  Smoking status: Former    Packs/day: 3.00    Years: 20.00    Pack years: 60.00    Types: Cigarettes    Quit date: 08/30/1983    Years since quitting: 38.0   Smokeless tobacco: Former    Types: Chew    Quit date: 08/29/1985  Vaping Use   Vaping Use: Never used  Substance and Sexual Activity   Alcohol use: Yes    Comment: 3 to 5 beers weekly   Drug use: No   Sexual activity: Not on file  Other Topics Concern   Not on file  Social History Narrative   Not on file   Social Determinants of Health   Financial Resource Strain: Not on file  Food Insecurity: Not on file  Transportation Needs: Not on file  Physical Activity: Not on file  Stress: Not on file  Social Connections: Not on file  Intimate Partner Violence: Not on file      Allergies  Allergen Reactions   Penicillins Other (See Comments)    Causes patient to "pass out"DID THE REACTION INVOLVE: Swelling of the face/tongue/throat, SOB, or low BP? No Sudden or severe rash/hives, skin peeling, or the inside of the mouth or nose? No Did it require medical treatment? No When did it last happen?       If all above answers are "NO", may proceed with cephalosporin use.    Shellfish Allergy Hives, Shortness Of Breath and Swelling    Facial Swelling  ears turn red   Atorvastatin Other (See Comments)    Muscle pain and muscle weakness   Simvastatin     Muscle pain and muscle weakness   Codeine Nausea And Vomiting     Outpatient Medications Prior to Visit  Medication Sig Dispense Refill   albuterol (VENTOLIN HFA) 108 (90 Base) MCG/ACT inhaler Inhale 2 puffs into the lungs every 6 (six) hours as needed for wheezing or shortness of breath.     ALPRAZolam (XANAX) 0.5 MG tablet Take 0.25-0.5 mg by mouth See admin instructions. Take 0.5 mg at bedtime, may take a 0.25 mg dose as needed for anxiety     aspirin EC 81 MG tablet Take 81 mg by mouth at bedtime.     Calcium Carb-Cholecalciferol (CALCIUM 600 + D PO) Take 1 tablet by mouth daily.     colestipol (COLESTID) 1 g tablet Take 1 g by mouth 2 (two) times daily.     DULoxetine (CYMBALTA) 20 MG capsule Take 20 mg by mouth at bedtime.     ezetimibe (ZETIA) 10 MG tablet Take 10 mg by mouth daily.     fluticasone (FLONASE) 50 MCG/ACT nasal spray Place 1 spray into both nostrils daily as needed for allergies or rhinitis.     Fluticasone-Salmeterol (ADVAIR) 250-50 MCG/DOSE AEPB Inhale 1 puff into the lungs every 12 (twelve) hours.     guaifenesin (HUMIBID E) 400 MG TABS tablet Take 400 mg by mouth daily as needed (congestion).     hydrochlorothiazide (MICROZIDE) 12.5 MG capsule Take 12.5 mg by mouth daily.     hydroxypropyl methylcellulose / hypromellose (ISOPTO TEARS / GONIOVISC) 2.5 % ophthalmic solution Place 1 drop into  both eyes as needed for dry eyes.      losartan (COZAAR) 100 MG tablet Take 100 mg by mouth at bedtime.     montelukast (SINGULAIR) 10 MG tablet Take 10 mg by mouth daily.     Multiple Vitamin (MULTIVITAMIN WITH MINERALS) TABS tablet Take 1 tablet by mouth  daily.     niacin 500 MG tablet Take 500 mg by mouth daily.      Omega-3 Fatty Acids (FISH OIL) 1000 MG CAPS Take 1,000 mg by mouth daily.      omeprazole (PRILOSEC) 20 MG capsule Take 1 capsule (20 mg total) by mouth 2 (two) times daily before a meal. 60 capsule 5   linaclotide (LINZESS) 72 MCG capsule Take 1 capsule (72 mcg total) by mouth daily before breakfast. 30 capsule 1   polyethylene glycol-electrolytes (TRILYTE) 420 g solution Take 4,000 mLs by mouth as directed. 4000 mL 0   No facility-administered medications prior to visit.    Review of Systems  Constitutional:  Negative for chills, fever, malaise/fatigue and weight loss.  HENT:  Negative for hearing loss, sore throat and tinnitus.   Eyes:  Negative for blurred vision and double vision.  Respiratory:  Positive for cough. Negative for hemoptysis, sputum production, shortness of breath, wheezing and stridor.   Cardiovascular:  Negative for chest pain, palpitations, orthopnea, leg swelling and PND.  Gastrointestinal:  Negative for abdominal pain, constipation, diarrhea, heartburn, nausea and vomiting.  Genitourinary:  Negative for dysuria, hematuria and urgency.  Musculoskeletal:  Positive for back pain. Negative for joint pain and myalgias.  Skin:  Negative for itching and rash.  Neurological:  Negative for dizziness, tingling, weakness and headaches.  Endo/Heme/Allergies:  Negative for environmental allergies. Does not bruise/bleed easily.  Psychiatric/Behavioral:  Negative for depression. The patient is not nervous/anxious and does not have insomnia.   All other systems reviewed and are negative.   Objective:  Physical Exam Vitals reviewed.  Constitutional:       General: He is not in acute distress.    Appearance: He is well-developed.  HENT:     Head: Normocephalic and atraumatic.  Eyes:     General: No scleral icterus.    Conjunctiva/sclera: Conjunctivae normal.     Pupils: Pupils are equal, round, and reactive to light.  Neck:     Vascular: No JVD.     Trachea: No tracheal deviation.  Cardiovascular:     Rate and Rhythm: Normal rate and regular rhythm.     Heart sounds: Normal heart sounds. No murmur heard. Pulmonary:     Effort: Pulmonary effort is normal. No tachypnea, accessory muscle usage or respiratory distress.     Breath sounds: No stridor. No wheezing, rhonchi or rales.  Abdominal:     Palpations: Abdomen is soft.     Tenderness: There is no abdominal tenderness.  Musculoskeletal:        General: No tenderness.     Cervical back: Neck supple.  Lymphadenopathy:     Cervical: No cervical adenopathy.  Skin:    General: Skin is warm and dry.     Capillary Refill: Capillary refill takes less than 2 seconds.     Findings: No rash.  Neurological:     Mental Status: He is alert and oriented to person, place, and time.  Psychiatric:        Behavior: Behavior normal.     Vitals:   09/21/21 1534  BP: 134/82  Pulse: 62  Temp: 97.8 F (36.6 C)  TempSrc: Oral  SpO2: 97%  Weight: 209 lb (94.8 kg)  Height: 5\' 10"  (1.778 m)   97% on RA BMI Readings from Last 3 Encounters:  09/21/21 29.99 kg/m  07/23/21 30.13 kg/m  06/26/21 30.13 kg/m   Wt Readings from Last 3 Encounters:  09/21/21 209 lb (94.8 kg)  07/23/21  210 lb (95.3 kg)  06/26/21 210 lb (95.3 kg)     CBC    Component Value Date/Time   WBC 10.2 03/18/2021 1320   RBC 4.31 03/18/2021 1320   HGB 14.2 03/18/2021 1320   HGB 13.3 07/17/2020 1608   HCT 42.0 03/18/2021 1320   HCT 37.9 07/17/2020 1608   PLT 390 03/18/2021 1320   PLT 343 07/17/2020 1608   MCV 97.4 03/18/2021 1320   MCV 96 07/17/2020 1608   MCH 32.9 03/18/2021 1320   MCHC 33.8 03/18/2021 1320    RDW 13.2 03/18/2021 1320   RDW 12.2 07/17/2020 1608   LYMPHSABS 2.0 03/18/2021 1320   LYMPHSABS 2.3 07/17/2020 1608   MONOABS 0.6 03/18/2021 1320   EOSABS 0.2 03/18/2021 1320   EOSABS 0.4 07/17/2020 1608   BASOSABS 0.1 03/18/2021 1320   BASOSABS 0.1 07/17/2020 1608      Chest Imaging:  Report: from the [facility]: CT chest without contrast   DATE: July 23, 2021 (July 24, 2021)   COMPARISON: Apr 02, 2021   HISTORY: "Pulmonary nodule"   TECHNIQUE: Per [facility] radiology department.   CONTRAST: None   TOTAL DLP: 397 mg*cm   FINDINGS: A total of 913 images were submitted to [employer] ([facility]).   BONES: No acute fracture or bone destruction.   SOFT TISSUES: Noncontrast evaluation.   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 Mild coronary artery  calcification. Chronic left Bochdalek hernia. No lymphadenopathy  or effusion.   LUNGS: There is motion artifact. Minimal atelectasis. No  consolidation, new or enlarging lung nodule.   The previously reported 8 mm left lower lobe nodule (image 159 of  CT #5) is stable. It appears tubular and may be a vessel or  impacted bronchus. Similar previously noted left lower lobe  tubular (e.g., image 212) and bilateral upper micronodules (less  than 4 mm) are stable.   UPPER ABDOMEN: Limited evaluation. Chronic hepatic cyst and  gastric diverticulum.   Impression: Noncontrast CT. Stable nodular densities. Consider CT in 6-12 months.   Pulmonary Functions Testing Results: No flowsheet data found.  FeNO:   Pathology:   Echocardiogram:   Heart Catheterization:     Assessment & Plan:     ICD-10-CM   1. Lung nodule  R91.1     2. History of COVID-19  Z86.16     3. Chronic asthma without complication, unspecified asthma severity, unspecified whether persistent  J45.909       Discussion:  This is a 75 year old gentleman, history of asthma as a child and recurrent intermittent episodes in  adulthood, longstanding history of smoking, 60 pack years, 20 years at 68 packs/day.  Recent COVID infection 4 weeks ago slowly recovering still has a little bit of a cough.  Plan: Referred today for evaluation of lung nodule.  CT scan was completed in August which showed an 8 mm lesion. Will recommend a short-term 42-month follow-up to the patient's smoking history. We will follow the nodule closely. He can continue his current inhaler regimen which she feels like is managing his asthma symptoms. With a smoking history there is a possibility of underlying COPD. Does not have any PFTs that I can view at this time but we may need to consider this in the future. Patient to return to clinic and see [place] in 4 months after his repeat CT scan of the chest in February 2023.    Current Outpatient Medications:    albuterol (VENTOLIN HFA) 108 (90  Base) MCG/ACT inhaler, Inhale 2 puffs into the lungs every 6 (six) hours as needed for wheezing or shortness of breath., Disp: , Rfl:    ALPRAZolam (XANAX) 0.5 MG tablet, Take 0.25-0.5 mg by mouth See admin instructions. Take 0.5 mg at bedtime, may take a 0.25 mg dose as needed for anxiety, Disp: , Rfl:    aspirin EC 81 MG tablet, Take 81 mg by mouth at bedtime., Disp: , Rfl:    Calcium Carb-Cholecalciferol (CALCIUM 600 + D PO), Take 1 tablet by mouth daily., Disp: , Rfl:    colestipol (COLESTID) 1 g tablet, Take 1 g by mouth 2 (two) times daily., Disp: , Rfl:    DULoxetine (CYMBALTA) 20 MG capsule, Take 20 mg by mouth at bedtime., Disp: , Rfl:    ezetimibe (ZETIA) 10 MG tablet, Take 10 mg by mouth daily., Disp: , Rfl:    fluticasone (FLONASE) 50 MCG/ACT nasal spray, Place 1 spray into both nostrils daily as needed for allergies or rhinitis., Disp: , Rfl:    Fluticasone-Salmeterol (ADVAIR) 250-50 MCG/DOSE AEPB, Inhale 1 puff into the lungs every 12 (twelve) hours., Disp: , Rfl:    guaifenesin (HUMIBID E) 400 MG TABS tablet, Take 400 mg by mouth daily as needed  (congestion)., Disp: , Rfl:    hydrochlorothiazide (MICROZIDE) 12.5 MG capsule, Take 12.5 mg by mouth daily., Disp: , Rfl:    hydroxypropyl methylcellulose / hypromellose (ISOPTO TEARS / GONIOVISC) 2.5 % ophthalmic solution, Place 1 drop into both eyes as needed for dry eyes. , Disp: , Rfl:    losartan (COZAAR) 100 MG tablet, Take 100 mg by mouth at bedtime., Disp: , Rfl:    montelukast (SINGULAIR) 10 MG tablet, Take 10 mg by mouth daily., Disp: , Rfl:    Multiple Vitamin (MULTIVITAMIN WITH MINERALS) TABS tablet, Take 1 tablet by mouth daily., Disp: , Rfl:    niacin 500 MG tablet, Take 500 mg by mouth daily. , Disp: , Rfl:    Omega-3 Fatty Acids (FISH OIL) 1000 MG CAPS, Take 1,000 mg by mouth daily. , Disp: , Rfl:    omeprazole (PRILOSEC) 20 MG capsule, Take 1 capsule (20 mg total) by mouth 2 (two) times daily before a meal., Disp: 60 capsule, Rfl: 5  I spent 46 minutes dedicated to the care of this patient on the date of this encounter to include pre-visit review of records, face-to-face time with the patient discussing conditions above, post visit ordering of testing, clinical documentation with the electronic health record, making appropriate referrals as documented, and communicating necessary findings to members of the patients care team.    Garner Nash, DO [facility] 09/21/2021 3:48 PM

## 2021-09-25 ENCOUNTER — Ambulatory Visit (INDEPENDENT_AMBULATORY_CARE_PROVIDER_SITE_OTHER): Payer: Medicare Other | Admitting: Gastroenterology

## 2021-09-25 ENCOUNTER — Encounter: Payer: Self-pay | Admitting: Gastroenterology

## 2021-09-25 ENCOUNTER — Other Ambulatory Visit: Payer: Self-pay

## 2021-09-25 VITALS — BP 142/80 | HR 74 | Temp 96.9°F | Ht 70.0 in | Wt 208.0 lb

## 2021-09-25 DIAGNOSIS — Z86010 Personal history of colonic polyps: Secondary | ICD-10-CM | POA: Diagnosis not present

## 2021-09-25 DIAGNOSIS — K219 Gastro-esophageal reflux disease without esophagitis: Secondary | ICD-10-CM | POA: Diagnosis not present

## 2021-09-25 NOTE — Patient Instructions (Signed)
Continue omeprazole daily.   We will see you back as needed!  Please call if any problems swallowing, blood in stool, abdominal pain.  I enjoyed seeing you again today! As you know, I value our relationship and want to provide genuine, compassionate, and quality care. I welcome your feedback. If you receive a survey regarding your visit,  I greatly appreciate you taking time to fill this out. See you next time!  Annitta Needs, PhD, ANP-BC [facility]

## 2021-09-25 NOTE — Progress Notes (Signed)
Referring Provider: [name] Care Physician:  [name] GI: Dr. Abbey Chatters   Chief Complaint  Patient presents with   Constipation    Stopped Linzess d/t causing diarrhea. No constipation now   Gastroesophageal Reflux    Doing ok    HPI:   Roberto Sanchez. is a 75 y.o. male presenting today with a history of dysphagia in the past requiring several dilations, chronic GERD, history of numerous colon polyps in Aug 2021, undergoing EGD/colonoscopy in interim from last visit. Colonoscopy Aug 2022 with tubular adenomas and 3 year surveillance recommended. EGD with moderate Shatzki ring, small hiatal hernia, gastritis s/p biopsy, normal duodenum. Negative H.pylori. Positive family history of colon cancer in father.   He is doing well now. Dysphagia resolved. No abdominal pain. Omeprazole once daily. No constipation or diarrhea. No overt GI bleeding. He gets his refills from the [place]. No GI concerns today.     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[place])    Rupture of right triceps tendon 09/01/2015    Past Surgical History:  Procedure Laterality Date   BALLOON DILATION N/A 06/26/2021   Procedure: BALLOON DILATION;  Surgeon: Eloise Harman, DO;  [facility];  Service: Endoscopy;  Laterality: N/A;   COLONOSCOPY WITH PROPOFOL N/A 07/14/2020   diverticulosis, 5 polyps (the largest of which was 14 mm in the cecum) and recommended repeat in 1 year (tubular adenomas).    COLONOSCOPY WITH PROPOFOL N/A 06/26/2021   Procedure: COLONOSCOPY WITH PROPOFOL;  Surgeon: Eloise Harman, DO;  [facility];  Service: Endoscopy;  Laterality: N/A;  8:30am   ESOPHAGOGASTRODUODENOSCOPY (EGD) WITH PROPOFOL N/A 06/26/2021   Procedure: ESOPHAGOGASTRODUODENOSCOPY (EGD) WITH PROPOFOL;  Surgeon: Eloise Harman, DO;  [facility];  Service: Endoscopy;  Laterality: N/A;   KNEE ARTHROPLASTY Left 10/2020   KNEE ARTHROSCOPY Right 1980s   NASAL SINUS SURGERY     POLYPECTOMY  07/14/2020   Procedure: POLYPECTOMY;  Surgeon: Eloise Harman, DO;  [facility];  Service: Endoscopy;;   POLYPECTOMY  06/26/2021   Procedure: POLYPECTOMY;  Surgeon: Eloise Harman, DO;  [facility];  Service: Endoscopy;;   PROSTATE SURGERY     TONSILLECTOMY  age 58   TRICEPS TENDON REPAIR Right 09/01/2015   Procedure: RIGHT TRICEPS  REPAIR;  Surgeon: Marchia Bond, MD;  Location: [place];  Service: Orthopedics;  Laterality: Right;    Current Outpatient Medications  Medication Sig Dispense Refill   albuterol (VENTOLIN HFA) 108 (90 Base) MCG/ACT inhaler Inhale 2 puffs into the lungs every 6 (six) hours as needed for wheezing or shortness of breath.     ALPRAZolam (XANAX) 0.5 MG tablet Take 0.25-0.5 mg by mouth See admin instructions. Take 0.5 mg at bedtime, may take a 0.25 mg dose as needed for anxiety     aspirin EC 81 MG tablet Take 81 mg by mouth at bedtime.     Calcium Carb-Cholecalciferol (CALCIUM 600 + D PO) Take 1 tablet by mouth daily.     DULoxetine (CYMBALTA) 20 MG capsule Take 20 mg by mouth at bedtime.     ezetimibe (ZETIA) 10 MG tablet Take 10 mg by mouth daily.     fluticasone (FLONASE) 50 MCG/ACT nasal spray Place  1 spray into both nostrils daily as needed for allergies or rhinitis.     Fluticasone-Salmeterol (ADVAIR) 250-50 MCG/DOSE AEPB Inhale 1 puff into the lungs every 12 (twelve) hours.     guaifenesin (HUMIBID E) 400 MG TABS tablet Take 400 mg by mouth daily as needed (congestion).     hydrochlorothiazide (MICROZIDE) 12.5 MG capsule Take 12.5 mg by mouth daily.     hydroxypropyl methylcellulose / hypromellose (ISOPTO TEARS / GONIOVISC) 2.5 % ophthalmic solution Place 1 drop into both eyes as needed for dry eyes.      losartan (COZAAR) 100 MG tablet Take 100 mg by mouth at bedtime.      montelukast (SINGULAIR) 10 MG tablet Take 10 mg by mouth daily.     Multiple Vitamin (MULTIVITAMIN WITH MINERALS) TABS tablet Take 1 tablet by mouth daily.     niacin 500 MG tablet Take 500 mg by mouth daily.      Omega-3 Fatty Acids (FISH OIL) 1000 MG CAPS Take 1,000 mg by mouth daily.      omeprazole (PRILOSEC) 20 MG capsule Take 1 capsule (20 mg total) by mouth 2 (two) times daily before a meal. 60 capsule 5   colestipol (COLESTID) 1 g tablet Take 1 g by mouth 2 (two) times daily. (Patient not taking: Reported on 09/25/2021)     No current facility-administered medications for this visit.    Allergies as of 09/25/2021 - Review Complete 09/25/2021  Allergen Reaction Noted   Penicillins Other (See Comments) 01/27/2012   Shellfish allergy Hives, Shortness Of Breath, and Swelling 10/04/2011   Atorvastatin Other (See Comments) 06/21/2019   Simvastatin  03/29/2019   Codeine Nausea And Vomiting 06/22/2021    Family History  Problem Relation Age of Onset   Asthma Maternal Uncle    Allergic rhinitis Brother    Allergic rhinitis Brother    Colon cancer Father 9   Angioedema Neg Hx    Eczema Neg Hx    Urticaria Neg Hx    Immunodeficiency Neg Hx     Social History   Socioeconomic History   Marital status: Married    Spouse name: Not on file   Number of children: Not on file   Years of education: Not on file   Highest education level: Not on file  Occupational History   Not on file  Tobacco Use   Smoking status: Former    Packs/day: 3.00    Years: 20.00    Pack years: 60.00    Types: Cigarettes    Quit date: 08/30/1983    Years since quitting: 38.0   Smokeless tobacco: Former    Types: Chew    Quit date: 08/29/1985  Vaping Use   Vaping Use: Never used  Substance and Sexual Activity   Alcohol use: Yes    Comment: 3 to 5 beers weekly   Drug use: No   Sexual activity: Not on file  Other Topics Concern   Not on file  Social History Narrative   Not on file   Social  Determinants of Health   Financial Resource Strain: Not on file  Food Insecurity: Not on file  Transportation Needs: Not on file  Physical Activity: Not on file  Stress: Not on file  Social Connections: Not on file    Review of Systems: Gen: Denies fever, chills, anorexia. Denies fatigue, weakness, weight loss.  CV: Denies chest pain, palpitations, syncope, peripheral edema, and claudication. Resp: Denies dyspnea at rest, cough, wheezing, coughing up blood, and  pleurisy. GI: see HPI Derm: Denies rash, itching, dry skin Psych: Denies depression, anxiety, memory loss, confusion. No homicidal or suicidal ideation.  Heme: Denies bruising, bleeding, and enlarged lymph nodes.  Physical Exam: BP (!) 142/80   Pulse 74   Temp (!) 96.9 F (36.1 C) (Temporal)   Ht 5\' 10"  (1.778 m)   Wt 208 lb (94.3 kg)   BMI 29.84 kg/m  General:   Alert and oriented. No distress noted. Pleasant and cooperative.  Head:  Normocephalic and atraumatic. Eyes:  Conjuctiva clear without scleral icterus. Mouth:  mask in place Abdomen:  +BS, soft, non-tender and non-distended. No rebound or guarding. No HSM or masses noted. Msk:  Symmetrical without gross deformities. Normal posture. Extremities:  Without edema. Neurologic:  Alert and  oriented x4 Psych:  Alert and cooperative. Normal mood and affect.  ASSESSMENT/PLAN: Roberto Sanchez. is a 75 y.o. male presenting today with a history of dysphagia in the past requiring several dilations, chronic GERD, history of numerous colon polyps in Aug 2021, undergoing EGD/colonoscopy in interim from last visit. Colonoscopy Aug 2022 with tubular adenomas and 3 year surveillance recommended. EGD with moderate Shatzki ring, small hiatal hernia, gastritis s/p biopsy, normal duodenum. Negative H.pylori. Positive family history of colon cancer in father.   Doing well with resolution of dysphagia. He has no concerning lower or upper GI signs/symptoms. As he is doing well, we  will see him back prn. Refills are obtained through the [place].  Colonoscopy is on recall for 3 years due to history of numerous polyps and family history.   He was instructed on signs/symptoms to report. Return prn.  Annitta Needs, PhD, ANP-BC [facility]

## 2021-10-29 ENCOUNTER — Encounter (HOSPITAL_COMMUNITY): Payer: Self-pay | Admitting: *Deleted

## 2021-10-29 ENCOUNTER — Emergency Department (HOSPITAL_COMMUNITY): Payer: No Typology Code available for payment source

## 2021-10-29 ENCOUNTER — Inpatient Hospital Stay (HOSPITAL_COMMUNITY)
Admission: EM | Admit: 2021-10-29 | Discharge: 2021-11-03 | DRG: 871 | Disposition: A | Discharge status: Home Health | Payer: No Typology Code available for payment source | Attending: Family Medicine | Admitting: Family Medicine

## 2021-10-29 ENCOUNTER — Other Ambulatory Visit: Payer: Self-pay

## 2021-10-29 DIAGNOSIS — E872 Acidosis, unspecified: Secondary | ICD-10-CM | POA: Diagnosis present

## 2021-10-29 DIAGNOSIS — Z885 Allergy status to narcotic agent status: Secondary | ICD-10-CM

## 2021-10-29 DIAGNOSIS — Z825 Family history of asthma and other chronic lower respiratory diseases: Secondary | ICD-10-CM

## 2021-10-29 DIAGNOSIS — K219 Gastro-esophageal reflux disease without esophagitis: Secondary | ICD-10-CM | POA: Diagnosis present

## 2021-10-29 DIAGNOSIS — R0989 Other specified symptoms and signs involving the circulatory and respiratory systems: Secondary | ICD-10-CM

## 2021-10-29 DIAGNOSIS — Z96652 Presence of left artificial knee joint: Secondary | ICD-10-CM | POA: Diagnosis present

## 2021-10-29 DIAGNOSIS — Z87891 Personal history of nicotine dependence: Secondary | ICD-10-CM | POA: Diagnosis not present

## 2021-10-29 DIAGNOSIS — R7989 Other specified abnormal findings of blood chemistry: Secondary | ICD-10-CM | POA: Diagnosis present

## 2021-10-29 DIAGNOSIS — E876 Hypokalemia: Secondary | ICD-10-CM | POA: Diagnosis present

## 2021-10-29 DIAGNOSIS — N179 Acute kidney failure, unspecified: Secondary | ICD-10-CM | POA: Diagnosis present

## 2021-10-29 DIAGNOSIS — J9601 Acute respiratory failure with hypoxia: Secondary | ICD-10-CM | POA: Diagnosis present

## 2021-10-29 DIAGNOSIS — I4891 Unspecified atrial fibrillation: Secondary | ICD-10-CM | POA: Diagnosis not present

## 2021-10-29 DIAGNOSIS — J189 Pneumonia, unspecified organism: Secondary | ICD-10-CM | POA: Diagnosis present

## 2021-10-29 DIAGNOSIS — R6520 Severe sepsis without septic shock: Secondary | ICD-10-CM | POA: Diagnosis present

## 2021-10-29 DIAGNOSIS — Z7982 Long term (current) use of aspirin: Secondary | ICD-10-CM

## 2021-10-29 DIAGNOSIS — J45909 Unspecified asthma, uncomplicated: Secondary | ICD-10-CM | POA: Diagnosis present

## 2021-10-29 DIAGNOSIS — E785 Hyperlipidemia, unspecified: Secondary | ICD-10-CM | POA: Diagnosis present

## 2021-10-29 DIAGNOSIS — M1990 Unspecified osteoarthritis, unspecified site: Secondary | ICD-10-CM | POA: Diagnosis present

## 2021-10-29 DIAGNOSIS — R0602 Shortness of breath: Secondary | ICD-10-CM | POA: Diagnosis present

## 2021-10-29 DIAGNOSIS — I10 Essential (primary) hypertension: Secondary | ICD-10-CM | POA: Diagnosis present

## 2021-10-29 DIAGNOSIS — Z8546 Personal history of malignant neoplasm of prostate: Secondary | ICD-10-CM | POA: Diagnosis not present

## 2021-10-29 DIAGNOSIS — Z20822 Contact with and (suspected) exposure to covid-19: Secondary | ICD-10-CM | POA: Diagnosis present

## 2021-10-29 DIAGNOSIS — Z91013 Allergy to seafood: Secondary | ICD-10-CM

## 2021-10-29 DIAGNOSIS — F419 Anxiety disorder, unspecified: Secondary | ICD-10-CM | POA: Diagnosis present

## 2021-10-29 DIAGNOSIS — Z79899 Other long term (current) drug therapy: Secondary | ICD-10-CM

## 2021-10-29 DIAGNOSIS — I480 Paroxysmal atrial fibrillation: Secondary | ICD-10-CM | POA: Diagnosis present

## 2021-10-29 DIAGNOSIS — A419 Sepsis, unspecified organism: Secondary | ICD-10-CM | POA: Diagnosis present

## 2021-10-29 DIAGNOSIS — Z880 Allergy status to penicillin: Secondary | ICD-10-CM

## 2021-10-29 DIAGNOSIS — I452 Bifascicular block: Secondary | ICD-10-CM | POA: Diagnosis present

## 2021-10-29 DIAGNOSIS — Z7951 Long term (current) use of inhaled steroids: Secondary | ICD-10-CM

## 2021-10-29 DIAGNOSIS — Z888 Allergy status to other drugs, medicaments and biological substances status: Secondary | ICD-10-CM | POA: Diagnosis not present

## 2021-10-29 DIAGNOSIS — Z86010 Personal history of colonic polyps: Secondary | ICD-10-CM | POA: Diagnosis not present

## 2021-10-29 LAB — URINALYSIS, ROUTINE W REFLEX MICROSCOPIC
Bilirubin Urine: NEGATIVE
Glucose, UA: NEGATIVE mg/dL
Hgb urine dipstick: NEGATIVE
Ketones, ur: NEGATIVE mg/dL
Leukocytes,Ua: NEGATIVE
Nitrite: NEGATIVE
Protein, ur: NEGATIVE mg/dL
Specific Gravity, Urine: 1.004 — ABNORMAL LOW (ref 1.005–1.030)
pH: 7 (ref 5.0–8.0)

## 2021-10-29 LAB — CBC WITH DIFFERENTIAL/PLATELET
Band Neutrophils: 7 %
Basophils Absolute: 0 10*3/uL (ref 0.0–0.1)
Basophils Relative: 0 %
Eosinophils Absolute: 0 10*3/uL (ref 0.0–0.5)
Eosinophils Relative: 0 %
HCT: 43.1 % (ref 39.0–52.0)
Hemoglobin: 15 g/dL (ref 13.0–17.0)
Lymphocytes Relative: 6 %
Lymphs Abs: 1.4 10*3/uL (ref 0.7–4.0)
MCH: 34.1 pg — ABNORMAL HIGH (ref 26.0–34.0)
MCHC: 34.8 g/dL (ref 30.0–36.0)
MCV: 98 fL (ref 80.0–100.0)
Monocytes Absolute: 0 10*3/uL — ABNORMAL LOW (ref 0.1–1.0)
Monocytes Relative: 0 %
Neutro Abs: 22.5 10*3/uL — ABNORMAL HIGH (ref 1.7–7.7)
Neutrophils Relative %: 87 %
Platelets: 397 10*3/uL (ref 150–400)
RBC: 4.4 MIL/uL (ref 4.22–5.81)
RDW: 13 % (ref 11.5–15.5)
WBC: 23.9 10*3/uL — ABNORMAL HIGH (ref 4.0–10.5)
nRBC: 0 % (ref 0.0–0.2)

## 2021-10-29 LAB — RESP PANEL BY RT-PCR (FLU A&B, COVID) ARPGX2
Influenza A by PCR: NEGATIVE
Influenza B by PCR: NEGATIVE
SARS Coronavirus 2 by RT PCR: NEGATIVE

## 2021-10-29 LAB — COMPREHENSIVE METABOLIC PANEL
ALT: 20 U/L (ref 0–44)
AST: 21 U/L (ref 15–41)
Albumin: 3.2 g/dL — ABNORMAL LOW (ref 3.5–5.0)
Alkaline Phosphatase: 111 U/L (ref 38–126)
Anion gap: 14 (ref 5–15)
BUN: 28 mg/dL — ABNORMAL HIGH (ref 8–23)
CO2: 21 mmol/L — ABNORMAL LOW (ref 22–32)
Calcium: 8.5 mg/dL — ABNORMAL LOW (ref 8.9–10.3)
Chloride: 99 mmol/L (ref 98–111)
Creatinine, Ser: 1.9 mg/dL — ABNORMAL HIGH (ref 0.61–1.24)
GFR, Estimated: 36 mL/min — ABNORMAL LOW (ref >60)
Glucose, Bld: 128 mg/dL — ABNORMAL HIGH (ref 70–99)
Potassium: 3.5 mmol/L (ref 3.5–5.1)
Sodium: 134 mmol/L — ABNORMAL LOW (ref 135–145)
Total Bilirubin: 1.4 mg/dL — ABNORMAL HIGH (ref 0.3–1.2)
Total Protein: 6.7 g/dL (ref 6.5–8.1)

## 2021-10-29 LAB — LACTIC ACID, PLASMA
Lactic Acid, Venous: 5.5 mmol/L (ref 0.5–1.9)
Lactic Acid, Venous: 5 mmol/L (ref 0.5–1.9)

## 2021-10-29 LAB — D-DIMER, QUANTITATIVE: D-Dimer, Quant: 1.3 ug/mL-FEU — ABNORMAL HIGH (ref 0.00–0.50)

## 2021-10-29 LAB — TROPONIN I (HIGH SENSITIVITY)
Troponin I (High Sensitivity): 5 ng/L (ref <18)
Troponin I (High Sensitivity): 3 ng/L (ref <18)

## 2021-10-29 LAB — PROTIME-INR
INR: 1.1 (ref 0.8–1.2)
Prothrombin Time: 14.3 seconds (ref 11.4–15.2)

## 2021-10-29 LAB — BRAIN NATRIURETIC PEPTIDE: B Natriuretic Peptide: 142 pg/mL — ABNORMAL HIGH (ref 0.0–100.0)

## 2021-10-29 LAB — APTT: aPTT: 32 seconds (ref 24–36)

## 2021-10-29 LAB — PROCALCITONIN: Procalcitonin: 2.57 ng/mL

## 2021-10-29 MED ORDER — SODIUM CHLORIDE 0.9 % IV SOLN
1.0000 g | Freq: Once | INTRAVENOUS | Status: AC
  Administered 2021-10-29: 1 g via INTRAVENOUS
  Filled 2021-10-29: qty 10

## 2021-10-29 MED ORDER — FLUTICASONE PROPIONATE 50 MCG/ACT NA SUSP
1.0000 | Freq: Every day | NASAL | Status: DC | PRN
  Administered 2021-10-29 – 2021-11-01 (×3): 1 via NASAL
  Filled 2021-10-29: qty 16

## 2021-10-29 MED ORDER — ASPIRIN EC 81 MG PO TBEC
81.0000 mg | DELAYED_RELEASE_TABLET | Freq: Every day | ORAL | Status: DC
  Administered 2021-10-29 – 2021-10-30 (×2): 81 mg via ORAL
  Filled 2021-10-29 (×3): qty 1

## 2021-10-29 MED ORDER — ACETAMINOPHEN 325 MG PO TABS
650.0000 mg | ORAL_TABLET | Freq: Once | ORAL | Status: AC
  Administered 2021-10-29: 650 mg via ORAL
  Filled 2021-10-29: qty 2

## 2021-10-29 MED ORDER — ALPRAZOLAM 0.5 MG PO TABS
0.5000 mg | ORAL_TABLET | Freq: Every day | ORAL | Status: DC
  Administered 2021-10-29 – 2021-11-02 (×5): 0.5 mg via ORAL
  Filled 2021-10-29 (×6): qty 1

## 2021-10-29 MED ORDER — LACTATED RINGERS IV BOLUS (SEPSIS)
1000.0000 mL | Freq: Once | INTRAVENOUS | Status: AC
  Administered 2021-10-29: 1000 mL via INTRAVENOUS

## 2021-10-29 MED ORDER — POLYVINYL ALCOHOL 1.4 % OP SOLN: 1.0000 [drp] | OPHTHALMIC | Status: DC | PRN

## 2021-10-29 MED ORDER — ALBUTEROL SULFATE HFA 108 (90 BASE) MCG/ACT IN AERS
2.0000 | INHALATION_SPRAY | Freq: Four times a day (QID) | RESPIRATORY_TRACT | Status: DC | PRN
  Administered 2021-10-29: 2 via RESPIRATORY_TRACT
  Filled 2021-10-29: qty 6.7

## 2021-10-29 MED ORDER — PANTOPRAZOLE SODIUM 40 MG PO TBEC
40.0000 mg | DELAYED_RELEASE_TABLET | Freq: Every day | ORAL | Status: DC
  Administered 2021-10-29 – 2021-11-03 (×6): 40 mg via ORAL
  Filled 2021-10-29 (×7): qty 1

## 2021-10-29 MED ORDER — ONDANSETRON HCL 4 MG PO TABS: 4.0000 mg | ORAL_TABLET | Freq: Four times a day (QID) | ORAL | Status: DC | PRN

## 2021-10-29 MED ORDER — SODIUM CHLORIDE 0.9 % IV SOLN
500.0000 mg | INTRAVENOUS | Status: DC
  Administered 2021-10-30 – 2021-11-02 (×4): 500 mg via INTRAVENOUS
  Filled 2021-10-29: qty 500
  Filled 2021-10-29 (×3): qty 5

## 2021-10-29 MED ORDER — SODIUM CHLORIDE 0.9 % IV SOLN
2.0000 g | INTRAVENOUS | Status: DC
  Administered 2021-10-30 – 2021-11-03 (×5): 2 g via INTRAVENOUS
  Filled 2021-10-29 (×5): qty 20

## 2021-10-29 MED ORDER — ALBUTEROL SULFATE (2.5 MG/3ML) 0.083% IN NEBU
2.5000 mg | INHALATION_SOLUTION | Freq: Four times a day (QID) | RESPIRATORY_TRACT | Status: DC | PRN
  Administered 2021-10-29 – 2021-10-30 (×2): 2.5 mg via RESPIRATORY_TRACT
  Filled 2021-10-29 (×2): qty 3

## 2021-10-29 MED ORDER — ACETAMINOPHEN 650 MG RE SUPP: 650.0000 mg | Freq: Four times a day (QID) | RECTAL | Status: DC | PRN

## 2021-10-29 MED ORDER — GUAIFENESIN ER 600 MG PO TB12
600.0000 mg | ORAL_TABLET | Freq: Every day | ORAL | Status: DC | PRN
  Administered 2021-10-29: 600 mg via ORAL
  Filled 2021-10-29: qty 1

## 2021-10-29 MED ORDER — EZETIMIBE 10 MG PO TABS: 10.0000 mg | ORAL_TABLET | Freq: Every day | ORAL | Status: DC

## 2021-10-29 MED ORDER — LACTATED RINGERS IV SOLN: INTRAVENOUS | Status: AC

## 2021-10-29 MED ORDER — SODIUM CHLORIDE 0.9 % IV SOLN
500.0000 mg | Freq: Once | INTRAVENOUS | Status: AC
  Administered 2021-10-29: 500 mg via INTRAVENOUS
  Filled 2021-10-29: qty 500

## 2021-10-29 MED ORDER — LACTATED RINGERS IV BOLUS (SEPSIS)
1850.0000 mL | Freq: Once | INTRAVENOUS | Status: AC
  Administered 2021-10-29: 1850 mL via INTRAVENOUS

## 2021-10-29 MED ORDER — HEPARIN SODIUM (PORCINE) 5000 UNIT/ML IJ SOLN
5000.0000 [IU] | Freq: Three times a day (TID) | INTRAMUSCULAR | Status: DC
  Administered 2021-10-29 – 2021-10-31 (×6): 5000 [IU] via SUBCUTANEOUS
  Filled 2021-10-29 (×6): qty 1

## 2021-10-29 MED ORDER — DULOXETINE HCL 20 MG PO CPEP
20.0000 mg | ORAL_CAPSULE | Freq: Every day | ORAL | Status: DC
  Administered 2021-10-29 – 2021-11-02 (×5): 20 mg via ORAL
  Filled 2021-10-29 (×9): qty 1

## 2021-10-29 MED ORDER — MOMETASONE FURO-FORMOTEROL FUM 200-5 MCG/ACT IN AERO
2.0000 | INHALATION_SPRAY | Freq: Two times a day (BID) | RESPIRATORY_TRACT | Status: DC
  Administered 2021-10-29 – 2021-11-03 (×10): 2 via RESPIRATORY_TRACT
  Filled 2021-10-29 (×2): qty 8.8

## 2021-10-29 MED ORDER — ONDANSETRON HCL 4 MG/2ML IJ SOLN: 4.0000 mg | Freq: Four times a day (QID) | INTRAMUSCULAR | Status: DC | PRN

## 2021-10-29 MED ORDER — ACETAMINOPHEN 325 MG PO TABS
650.0000 mg | ORAL_TABLET | Freq: Four times a day (QID) | ORAL | Status: DC | PRN
  Administered 2021-10-30: 650 mg via ORAL
  Filled 2021-10-29 (×2): qty 2

## 2021-10-29 MED ORDER — MONTELUKAST SODIUM 10 MG PO TABS
10.0000 mg | ORAL_TABLET | Freq: Every day | ORAL | Status: DC
  Administered 2021-10-29 – 2021-11-03 (×5): 10 mg via ORAL
  Filled 2021-10-29 (×5): qty 1

## 2021-10-29 NOTE — Plan of Care (Signed)

## 2021-10-29 NOTE — H&P (Signed)
History and Physical    Roberto Sanchez. FAO:[id number] DOB: Jan 17, 1946 DOA: 10/29/2021  PCP: [name]   Patient coming from: Home  Chief Complaint: CP/SOB  HPI: Roberto Sanchez. is a 75 y.o. male with medical history significant for dyslipidemia, hypertension, GERD, asthma, and anxiety who was brought in by his wife to the ED for evaluation of left-sided chest pain as well as some shortness of breath that began approximately 2-3 days ago.  This has progressed with worsening pain as well as cough that has been nonproductive.  He is also complained of fever at home with temperature up to 100.5 F.  No chills noted.  He has been taking his other medications regularly and denies any sick contacts.  He apparently tested at home for COVID and was noted to be negative.  He tried taking ibuprofen with no improvement.   ED Course: Vital signs demonstrating soft blood pressure readings.  He has been started on fluid bolus per sepsis protocol and started on Rocephin and azithromycin to cover for community-acquired pneumonia.  Chest x-ray demonstrating left upper lobe pneumonia and lactic acid of 5.5.  Leukocytosis of 23,900 noted.  Creatinine is currently 1.9 with baseline 0.7-0.8 and BNP is 142.  D-dimer is 1.3.  EKG was sinus rhythm at 94 bpm.  COVID and flu testing negative.  Review of Systems: Reviewed as noted above, otherwise negative.  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[place])    Rupture of right triceps tendon 09/01/2015    Past Surgical History:  Procedure Laterality Date   BALLOON DILATION N/A 06/26/2021   Procedure: BALLOON DILATION;  Surgeon: Eloise Harman, DO;  [facility]: [facility];  Service: Endoscopy;  Laterality: N/A;   COLONOSCOPY WITH PROPOFOL N/A 07/14/2020   diverticulosis, 5 polyps (the largest of which was 14 mm in the cecum) and  recommended repeat in 1 year (tubular adenomas).    COLONOSCOPY WITH PROPOFOL N/A 06/26/2021   fair prep, non-bleeding internal hemorrhoids, sigmoid diverticulosis, two 4-7 mm polyps in cecum, one 2 mmp olyp in cecum. 3 year surveillance. Tubular adenoma.   ESOPHAGOGASTRODUODENOSCOPY (EGD) WITH PROPOFOL N/A 06/26/2021   moderate Shatzki ring, small hiatal hernia, gastritis s/p biopsy, normal duodenum. Negative H.pylori.   KNEE ARTHROPLASTY Left 10/2020   KNEE ARTHROSCOPY Right 1980s   NASAL SINUS SURGERY     POLYPECTOMY  07/14/2020   Procedure: POLYPECTOMY;  Surgeon: Eloise Harman, DO;  Location: [facility];  Service: Endoscopy;;   POLYPECTOMY  06/26/2021   Procedure: POLYPECTOMY;  Surgeon: Eloise Harman, DO;  [facility]: [facility];  Service: Endoscopy;;   PROSTATE SURGERY     TONSILLECTOMY  age 89   TRICEPS TENDON REPAIR Right 09/01/2015   Procedure: RIGHT TRICEPS  REPAIR;  Surgeon: Marchia Bond, MD;  Location: [place];  Service: Orthopedics;  Laterality: Right;     reports that he quit smoking about 38 years ago. His smoking use included cigarettes. He has a 60.00 pack-year smoking history. He quit smokeless tobacco use about 36 years ago.  His smokeless tobacco use included chew. He reports current alcohol use. He reports that he does not use drugs.  Allergies  Allergen Reactions   Penicillins Other (See Comments)    Causes patient to "pass out"DID THE REACTION INVOLVE: Swelling of the face/tongue/throat,  SOB, or low BP? No Sudden or severe rash/hives, skin peeling, or the inside of the mouth or nose? No Did it require medical treatment? No When did it last happen?       If all above answers are "NO", may proceed with cephalosporin use.    Shellfish Allergy Hives, Shortness Of Breath and Swelling    Facial Swelling  ears turn red   Atorvastatin Other (See Comments)    Muscle pain and muscle weakness   Simvastatin     Muscle pain and muscle weakness    Codeine Nausea And Vomiting    Family History  Problem Relation Age of Onset   Asthma Maternal Uncle    Allergic rhinitis Brother    Allergic rhinitis Brother    Colon cancer Father 83   Angioedema Neg Hx    Eczema Neg Hx    Urticaria Neg Hx    Immunodeficiency Neg Hx     Prior to Admission medications   Medication Sig Start Date End Date Taking? Authorizing Provider  albuterol (VENTOLIN HFA) 108 (90 Base) MCG/ACT inhaler Inhale 2 puffs into the lungs every 6 (six) hours as needed for wheezing or shortness of breath. 03/16/08   [provider]  ALPRAZolam Duanne Moron) 0.5 MG tablet Take 0.25-0.5 mg by mouth See admin instructions. Take 0.5 mg at bedtime, may take a 0.25 mg dose as needed for anxiety    [provider]  aspirin EC 81 MG tablet Take 81 mg by mouth at bedtime.    [provider]  Calcium Carb-Cholecalciferol (CALCIUM 600 + D PO) Take 1 tablet by mouth daily.    [provider]  DULoxetine (CYMBALTA) 20 MG capsule Take 20 mg by mouth at bedtime.    [provider]  ezetimibe (ZETIA) 10 MG tablet Take 10 mg by mouth daily.    [provider]  fluticasone (FLONASE) 50 MCG/ACT nasal spray Place 1 spray into both nostrils daily as needed for allergies or rhinitis.    [provider]  Fluticasone-Salmeterol (ADVAIR) 250-50 MCG/DOSE AEPB Inhale 1 puff into the lungs every 12 (twelve) hours.    [provider]  guaifenesin (HUMIBID E) 400 MG TABS tablet Take 400 mg by mouth daily as needed (congestion).    [provider]  hydrochlorothiazide (MICROZIDE) 12.5 MG capsule Take 12.5 mg by mouth daily.    [provider]  hydroxypropyl methylcellulose / hypromellose (ISOPTO TEARS / GONIOVISC) 2.5 % ophthalmic solution Place 1 drop into both eyes as needed for dry eyes.     [provider]  losartan (COZAAR) 100 MG tablet Take 100 mg by mouth at bedtime.    [provider]   montelukast (SINGULAIR) 10 MG tablet Take 10 mg by mouth daily. 03/18/15   [provider]  Multiple Vitamin (MULTIVITAMIN WITH MINERALS) TABS tablet Take 1 tablet by mouth daily.    [provider]  niacin 500 MG tablet Take 500 mg by mouth daily.     [provider]  Omega-3 Fatty Acids (FISH OIL) 1000 MG CAPS Take 1,000 mg by mouth daily.     [provider]  omeprazole (PRILOSEC) 20 MG capsule Take 1 capsule (20 mg total) by mouth 2 (two) times daily before a meal. 06/27/21 12/24/21  Mahala Menghini, PA-C    Physical Exam: Vitals:   10/29/21 1245 10/29/21 1300 10/29/21 1330 10/29/21 1400  BP:  (!) 106/59 99/66 108/63  Pulse: 86 79 93 81  Resp: (!)  22 20 (!) 23 17  Temp:      SpO2: 100% 100% 100% 100%  Weight:      Height:        Constitutional: NAD, calm, comfortable Vitals:   10/29/21 1245 10/29/21 1300 10/29/21 1330 10/29/21 1400  BP:  (!) 106/59 99/66 108/63  Pulse: 86 79 93 81  Resp: (!) 22 20 (!) 23 17  Temp:      SpO2: 100% 100% 100% 100%  Weight:      Height:       Eyes: lids and conjunctivae normal Neck: normal, supple Respiratory: clear to auscultation bilaterally. Normal respiratory effort. No accessory muscle use.  Currently with 2 L nasal cannula oxygen. Cardiovascular: Regular rate and rhythm, no murmurs. Abdomen: no tenderness, no distention. Bowel sounds positive.  Musculoskeletal:  No edema. Skin: no rashes, lesions, ulcers.  Psychiatric: Flat affect  Labs on Admission: I have personally reviewed following labs and imaging studies  CBC: Recent Labs  Lab 10/29/21 1212  WBC 23.9*  NEUTROABS 22.5*  HGB 15.0  HCT 43.1  MCV 98.0  PLT 161   Basic Metabolic Panel: Recent Labs  Lab 10/29/21 1212  NA 134*  K 3.5  CL 99  CO2 21*  GLUCOSE 128*  BUN 28*  CREATININE 1.90*  CALCIUM 8.5*   GFR: Estimated Creatinine Clearance: 38.9 mL/min (A) (by C-G formula based on SCr of 1.9 mg/dL (H)). Liver Function  Tests: Recent Labs  Lab 10/29/21 1212  AST 21  ALT 20  ALKPHOS 111  BILITOT 1.4*  PROT 6.7  ALBUMIN 3.2*   No results for input(s): LIPASE, AMYLASE in the last 168 hours. No results for input(s): AMMONIA in the last 168 hours. Coagulation Profile: Recent Labs  Lab 10/29/21 1212  INR 1.1   Cardiac Enzymes: No results for input(s): CKTOTAL, CKMB, CKMBINDEX, TROPONINI in the last 168 hours. BNP (last 3 results) No results for input(s): PROBNP in the last 8760 hours. HbA1C: No results for input(s): HGBA1C in the last 72 hours. CBG: No results for input(s): GLUCAP in the last 168 hours. Lipid Profile: No results for input(s): CHOL, HDL, LDLCALC, TRIG, CHOLHDL, LDLDIRECT in the last 72 hours. Thyroid Function Tests: No results for input(s): TSH, T4TOTAL, FREET4, T3FREE, THYROIDAB in the last 72 hours. Anemia Panel: No results for input(s): VITAMINB12, FOLATE, FERRITIN, TIBC, IRON, RETICCTPCT in the last 72 hours. Urine analysis:    Component Value Date/Time   COLORURINE YELLOW 03/18/2021 1400   APPEARANCEUR HAZY (A) 03/18/2021 1400   LABSPEC 1.024 03/18/2021 1400   PHURINE 6.0 03/18/2021 1400   GLUCOSEU NEGATIVE 03/18/2021 1400   HGBUR NEGATIVE 03/18/2021 1400   BILIRUBINUR NEGATIVE 03/18/2021 1400   KETONESUR NEGATIVE 03/18/2021 1400   PROTEINUR NEGATIVE 03/18/2021 1400   UROBILINOGEN 0.2 03/20/2015 0730   NITRITE NEGATIVE 03/18/2021 1400   LEUKOCYTESUR NEGATIVE 03/18/2021 1400    Radiological Exams on Admission: DG Chest Port 1 View  Result Date: 10/29/2021 CLINICAL DATA:  Chest pain, shortness of breath EXAM: PORTABLE CHEST 1 VIEW COMPARISON:  09/03/2021 FINDINGS: The heart size and mediastinal contours are within normal limits. Hazy left upper lobe airspace opacity. Streaky left basilar opacity. No large pleural effusion. No pneumothorax. IMPRESSION: Hazy left upper lobe airspace opacity suspicious for pneumonia. Streaky left basilar opacity likely atelectasis.  Electronically Signed   By: Davina Poke D.O.   On: 10/29/2021 12:38    EKG: Independently reviewed. SR 94bpm. LAFB  Assessment/Plan Principal Problem:   Severe sepsis ([place])  Severe sepsis secondary to community-acquired pneumonia with lactic acidosis -Continue Rocephin and azithromycin -Monitor lactic acid and procalcitonin -Monitor microbiology -Close monitoring in stepdown unit given soft blood pressure readings with potential need for vasopressors if blood pressure does not improve after fluid bolus -Continue aggressive IV fluid  AKI -Baseline creatinine 0.7-0.8 -Continue on aggressive IV fluid and avoid nephrotoxic agents -Monitor strict I's and O's -Follow labs  Elevated D-dimer -No suspicion of PE at this time -Avoid CTA chest with AKI currently, may consider performing cost once renal function improves -Trend D-dimer  History of asthma -No acute bronchospasms currently -Breathing treatments as needed  Hypertension -Hold home BP medications given soft blood pressure and AKI -Monitor closely  Dyslipidemia -Hold home Zetia  GERD -Continue on PPI  Anxiety -Continue home medications   DVT prophylaxis: Heparin Code Status: Full Family Communication: Son and wife at bedside Disposition Plan:Admit for treatment of sepsis/pna Consults called:None Admission status: Inpatient, SDU  Analycia Khokhar D Manuella Ghazi DO [facility]  If 7PM-7AM, please contact night-coverage [email or web address]  10/29/2021, 2:24 PM

## 2021-10-29 NOTE — ED Triage Notes (Signed)
Chest pain with shortness of breath ?

## 2021-10-29 NOTE — ED Notes (Signed)
Pt placed on cardiac monitor with BP to set cycle every 30 minutes. Continuous pulse oximeter applied.  

## 2021-10-29 NOTE — ED Provider Notes (Signed)
[facility] EMERGENCY DEPARTMENT Provider Note   CSN: [id number] Arrival date & time: 10/29/21  1114     History Chief Complaint  Patient presents with   Shortness of Breath    Roberto Sanchez. is a 75 y.o. male.  He is brought in by his wife for evaluation of chest pain and shortness of breath.  Symptoms started for 5 days ago with body aches.  Since last evening has been more short of breath cough with sharp left-sided chest pain.  Denies pain with breathing.  Also has a headache.  Tested at home negative for COVID.  Taking ibuprofen without any improvement.  The history is provided by the patient.  Shortness of Breath Severity:  Moderate Onset quality:  Gradual Duration:  2 days Timing:  Constant Progression:  Unchanged Chronicity:  New Relieved by:  Nothing Worsened by:  Activity and coughing Ineffective treatments:  None tried Associated symptoms: chest pain, cough, fever and headaches   Associated symptoms: no abdominal pain, no diaphoresis, no hemoptysis, no rash, no sore throat, no sputum production, no vomiting and no wheezing   Risk factors: hx of cancer       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[place])    Rupture of right triceps tendon 09/01/2015    Patient Active Problem List   Diagnosis Date Noted   Pill dysphagia 04/05/2021   History of colonic polyps 01/31/2021   Bloating 07/25/2020   GERD (gastroesophageal reflux disease) 07/25/2020   Constipation 04/20/2020   Family history of colon cancer 04/20/2020   Rupture of right triceps tendon 09/01/2015   Anxiety 03/20/2015   Asthma, chronic    Vertigo 03/19/2015   Essential hypertension 03/19/2015   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    COLONOSCOPY WITH PROPOFOL N/A 06/26/2021   fair prep, non-bleeding internal hemorrhoids, sigmoid diverticulosis, two 4-7 mm polyps in cecum, one 2 mmp olyp in cecum. 3 year surveillance. Tubular adenoma.   ESOPHAGOGASTRODUODENOSCOPY (EGD) WITH PROPOFOL N/A 06/26/2021   moderate Shatzki ring, small hiatal hernia, gastritis s/p biopsy, normal duodenum. Negative H.pylori.   KNEE ARTHROPLASTY Left 10/2020   KNEE ARTHROSCOPY Right 1980s   NASAL SINUS SURGERY     POLYPECTOMY  07/14/2020   Procedure: POLYPECTOMY;  Surgeon: Eloise Harman, DO;  Location: [facility];  Service: Endoscopy;;   POLYPECTOMY  06/26/2021   Procedure: POLYPECTOMY;  Surgeon: Eloise Harman, DO;  Location: [facility];  Service: Endoscopy;;   PROSTATE SURGERY     TONSILLECTOMY  age 55   TRICEPS TENDON REPAIR Right 09/01/2015   Procedure: RIGHT TRICEPS  REPAIR;  Surgeon: Marchia Bond, MD;  Location: [facility];  Service: Orthopedics;  Laterality: Right;       Family History  Problem Relation Age of Onset  Asthma Maternal Uncle    Allergic rhinitis Brother    Allergic rhinitis Brother    Colon cancer Father 68   Angioedema Neg Hx    Eczema Neg Hx    Urticaria Neg Hx    Immunodeficiency Neg Hx     Social History   Tobacco Use   Smoking status: Former    Packs/day: 3.00    Years: 20.00    Pack years: 60.00    Types: Cigarettes    Quit date: 08/30/1983    Years since quitting: 38.1   Smokeless tobacco: Former    Types: Chew    Quit date: 08/29/1985  Vaping Use   Vaping Use: Never used   Substance Use Topics   Alcohol use: Yes    Comment: 3 to 5 beers weekly   Drug use: No    Home Medications Prior to Admission medications   Medication Sig Start Date End Date Taking? Authorizing Provider  albuterol (VENTOLIN HFA) 108 (90 Base) MCG/ACT inhaler Inhale 2 puffs into the lungs every 6 (six) hours as needed for wheezing or shortness of breath. 03/16/08   [provider]  ALPRAZolam Duanne Moron) 0.5 MG tablet Take 0.25-0.5 mg by mouth See admin instructions. Take 0.5 mg at bedtime, may take a 0.25 mg dose as needed for anxiety    [provider]  aspirin EC 81 MG tablet Take 81 mg by mouth at bedtime.    [provider]  Calcium Carb-Cholecalciferol (CALCIUM 600 + D PO) Take 1 tablet by mouth daily.    [provider]  DULoxetine (CYMBALTA) 20 MG capsule Take 20 mg by mouth at bedtime.    [provider]  ezetimibe (ZETIA) 10 MG tablet Take 10 mg by mouth daily.    [provider]  fluticasone (FLONASE) 50 MCG/ACT nasal spray Place 1 spray into both nostrils daily as needed for allergies or rhinitis.    [provider]  Fluticasone-Salmeterol (ADVAIR) 250-50 MCG/DOSE AEPB Inhale 1 puff into the lungs every 12 (twelve) hours.    [provider]  guaifenesin (HUMIBID E) 400 MG TABS tablet Take 400 mg by mouth daily as needed (congestion).    [provider]  hydrochlorothiazide (MICROZIDE) 12.5 MG capsule Take 12.5 mg by mouth daily.    [provider]  hydroxypropyl methylcellulose / hypromellose (ISOPTO TEARS / GONIOVISC) 2.5 % ophthalmic solution Place 1 drop into both eyes as needed for dry eyes.     [provider]  losartan (COZAAR) 100 MG tablet Take 100 mg by mouth at bedtime.    [provider]  montelukast (SINGULAIR) 10 MG tablet Take 10 mg by mouth daily. 03/18/15   [provider]  Multiple Vitamin (MULTIVITAMIN WITH MINERALS) TABS tablet Take 1 tablet by mouth  daily.    [provider]  niacin 500 MG tablet Take 500 mg by mouth daily.     [provider]  Omega-3 Fatty Acids (FISH OIL) 1000 MG CAPS Take 1,000 mg by mouth daily.     [provider]  omeprazole (PRILOSEC) 20 MG capsule Take 1 capsule (20 mg total) by mouth 2 (two) times daily before a meal. 06/27/21 12/24/21  Mahala Menghini, PA-C    Allergies    Penicillins, Shellfish allergy, Atorvastatin, Simvastatin, and Codeine  Review of Systems   Review of Systems  Constitutional:  Positive for fever. Negative for diaphoresis.  HENT:  Negative for sore throat.   Eyes:  Negative for visual disturbance.  Respiratory:  Positive for cough and shortness of breath. Negative for hemoptysis, sputum production and wheezing.   Cardiovascular:  Positive for chest pain.  Gastrointestinal:  Negative for abdominal pain and vomiting.  Genitourinary:  Negative for dysuria.  Musculoskeletal:  Positive for myalgias.  Skin:  Negative for rash.  Neurological:  Positive for headaches.   Physical Exam Updated Vital Signs BP (!) 83/57 (BP Location: Right Arm)   Pulse 93   Temp 98.1 F (36.7 C)   Resp 20   SpO2 (!) 82%   Physical Exam Vitals and nursing note reviewed.  Constitutional:      General: He is not in acute distress.    Appearance: He is well-developed.  HENT:     Head: Normocephalic and atraumatic.  Eyes:     Conjunctiva/sclera: Conjunctivae normal.  Cardiovascular:     Rate and Rhythm: Normal rate and regular rhythm.     Heart sounds: No murmur heard. Pulmonary:     Effort: Pulmonary effort is normal. No respiratory distress.     Breath sounds: Normal breath sounds.  Abdominal:     Palpations: Abdomen is soft.     Tenderness: There is no abdominal tenderness.  Musculoskeletal:        General: No swelling. Normal range of motion.     Cervical back: Neck supple.     Right lower leg: No tenderness. No edema.     Left lower leg: No tenderness. No edema.   Skin:    General: Skin is warm and dry.     Capillary Refill: Capillary refill takes less than 2 seconds.  Neurological:     General: No focal deficit present.     Mental Status: He is alert.  Psychiatric:        Mood and Affect: Mood normal.    ED Results / Procedures / Treatments   Labs (all labs ordered are listed, but only abnormal results are displayed) Labs Reviewed  LACTIC ACID, PLASMA - Abnormal; Notable for the following components:      Result Value   Lactic Acid, Venous 5.5 (*)    All other components within normal limits  LACTIC ACID, PLASMA - Abnormal; Notable for the following components:   Lactic Acid, Venous 5.0 (*)    All other components within normal limits  COMPREHENSIVE METABOLIC PANEL - Abnormal; Notable for the following components:   Sodium 134 (*)    CO2 21 (*)    Glucose, Bld 128 (*)    BUN 28 (*)    Creatinine, Ser 1.90 (*)    Calcium 8.5 (*)    Albumin 3.2 (*)    Total Bilirubin 1.4 (*)    GFR, Estimated 36 (*)    All other components within normal limits  CBC WITH DIFFERENTIAL/PLATELET - Abnormal; Notable for the following components:   WBC 23.9 (*)    MCH 34.1 (*)    Neutro Abs 22.5 (*)    Monocytes Absolute 0.0 (*)    All other components within normal limits  URINALYSIS, ROUTINE W REFLEX MICROSCOPIC - Abnormal; Notable for the following components:   Specific Gravity, Urine 1.004 (*)    All other components within normal limits  BRAIN NATRIURETIC PEPTIDE - Abnormal; Notable for the following components:   B Natriuretic Peptide 142.0 (*)    All other components within normal limits  D-DIMER, QUANTITATIVE - Abnormal; Notable for the following components:   D-Dimer, Quant 1.30 (*)    All other components within normal limits  CULTURE, BLOOD (ROUTINE X 2)  CULTURE, BLOOD (ROUTINE X 2)  RESP PANEL BY RT-PCR (FLU A&B, COVID) ARPGX2  URINE CULTURE  PROTIME-INR  APTT  PROCALCITONIN  LEGIONELLA PNEUMOPHILA SEROGP 1 UR AG  STREP  PNEUMONIAE URINARY ANTIGEN  MAGNESIUM  COMPREHENSIVE METABOLIC PANEL  CBC  D-DIMER, QUANTITATIVE  LACTIC ACID, PLASMA  PROCALCITONIN  TROPONIN I (HIGH SENSITIVITY)  TROPONIN I (HIGH SENSITIVITY)    EKG EKG Interpretation  Date/Time:  Monday October 29 2021 12:07:13 EST Ventricular Rate:  94 PR Interval:  156 QRS Duration: 135 QT Interval:  373 QTC Calculation: 467 R Axis:   -88 Text Interpretation: Sinus rhythm RBBB and LAFB No significant change since prior 4/22 Confirmed by Aletta Edouard [phone]) on 10/29/2021 12:15:13 PM  Radiology DG Chest Port 1 View  Result Date: 10/29/2021 CLINICAL DATA:  Chest pain, shortness of breath EXAM: PORTABLE CHEST 1 VIEW COMPARISON:  09/03/2021 FINDINGS: The heart size and mediastinal contours are within normal limits. Hazy left upper lobe airspace opacity. Streaky left basilar opacity. No large pleural effusion. No pneumothorax. IMPRESSION: Hazy left upper lobe airspace opacity suspicious for pneumonia. Streaky left basilar opacity likely atelectasis. Electronically Signed   By: Davina Poke D.O.   On: 10/29/2021 12:38    Procedures .Critical Care Performed by: Hayden Rasmussen, MD Authorized by: Hayden Rasmussen, MD   Critical care provider statement:    Critical care time (minutes):  45   Critical care time was exclusive of:  Separately billable procedures and treating other patients   Critical care was necessary to treat or prevent imminent or life-threatening deterioration of the following conditions:  Respiratory failure and sepsis   Critical care was time spent personally by me on the following activities:  Development of treatment plan with patient or surrogate, discussions with consultants, evaluation of patient's response to treatment, examination of patient, obtaining history from patient or surrogate, ordering and performing treatments and interventions, ordering and review of laboratory studies, ordering and review of  radiographic studies, pulse oximetry, re-evaluation of patient's condition and review of old charts   I assumed direction of critical care for this patient from another provider in my specialty: no     Medications Ordered in ED Medications  lactated ringers infusion ( Intravenous New Bag/Given 10/29/21 1434)  aspirin EC tablet 81 mg (has no administration in time range)  ALPRAZolam (XANAX) tablet 0.5 mg (has no administration in time range)  DULoxetine (CYMBALTA) DR capsule 20 mg (has no administration in time range)  pantoprazole (PROTONIX) EC tablet 40 mg (has no administration in time range)  albuterol (VENTOLIN HFA) 108 (90 Base) MCG/ACT inhaler 2 puff (has no administration in time range)  fluticasone (FLONASE) 50 MCG/ACT nasal spray 1 spray (has no administration in time range)  mometasone-formoterol (DULERA) 200-5 MCG/ACT inhaler 2 puff (has no administration in time range)  guaiFENesin (MUCINEX) 12 hr tablet 600 mg (has no administration in time range)  montelukast (SINGULAIR) tablet 10 mg (has no administration in time range)  polyvinyl alcohol (LIQUIFILM TEARS) 1.4 % ophthalmic solution 1 drop (has no administration in time range)  heparin injection 5,000 Units (has no administration in time range)  acetaminophen (TYLENOL) tablet 650 mg (has no administration in time range)    Or  acetaminophen (TYLENOL) suppository 650 mg (has no administration in time range)  ondansetron (ZOFRAN) tablet 4 mg (has no administration in time range)    Or  ondansetron (ZOFRAN) injection 4 mg (has no administration in time range)  cefTRIAXone (ROCEPHIN) 2 g in sodium chloride 0.9 % 100 mL IVPB (has no administration in time range)  azithromycin (ZITHROMAX) 500 mg in sodium chloride 0.9 % 250 mL IVPB (has no administration in time range)  lactated ringers bolus 1,000 mL (0 mLs Intravenous Stopped 10/29/21 1331)  acetaminophen (TYLENOL) tablet 650 mg (650 mg Oral Given 10/29/21 1230)  cefTRIAXone  (ROCEPHIN) 1 g in sodium chloride 0.9 % 100 mL IVPB (0 g Intravenous Stopped 10/29/21 1330)  azithromycin (ZITHROMAX) 500 mg in sodium chloride 0.9 % 250 mL IVPB (0 mg Intravenous Stopped 10/29/21 1343)  lactated ringers bolus 1,850 mL (0 mLs Intravenous Stopped 10/29/21 1505)    ED Course  I have reviewed the triage vital signs and the nursing notes.  Pertinent labs & imaging results that were available during my care of the patient were reviewed by me and considered in my medical decision making (see chart for details).  Clinical Course as of 10/29/21 1756  Mon Oct 29, 2021  1232 Chest x-ray interpreted by me as left upper lobe pneumonia.  Awaiting radiology reading. [MB]  1324 Blood pressure trending up.  Lactate elevated so code sepsis activated.  Coverage for CAP with ceftriaxone and Zithromax.  Patient still mentating well.  He is agreeable to admission.  Discussed with [facility] hospitalist Dr. Manuella Ghazi who will evaluate for admission [MB]    Clinical Course User Index [MB] Hayden Rasmussen, MD   MDM Rules/Calculators/A&P                          [name] was evaluated in Emergency Department on 10/29/2021 for the symptoms described in the history of present illness. He was evaluated in the context of the global COVID-19 pandemic, which necessitated consideration that the patient might be at risk for infection with the SARS-CoV-2 virus that causes COVID-19. Institutional protocols and algorithms that pertain to the evaluation of patients at risk for COVID-19 are in a state of rapid change based on information released by regulatory bodies including the CDC and federal and state organizations. These policies and algorithms were followed during the patient's care in the ED.  This patient complains of fever cough left-sided chest pain; this involves an extensive number of treatment Options and is a complaint that carries with it a high risk of complications and Morbidity. The  differential includes COVID, flu, pneumonia, pneumothorax, PE, vascular, ACS, dehydration  I ordered, reviewed and interpreted labs, which included CBC remarkable elevated white count stable hemoglobin, chemistries with low bicarb elevated BUN and creatinine reflecting some dehydration, BNP mildly elevated, troponins unremarkable, lactate elevated, COVID and flu negative I ordered medication IV fluids IV antibiotics I ordered imaging studies which included chest x-ray and I independently    visualized and interpreted imaging which showed left upper lobe pneumonia Additional history obtained from patient's wife Previous records obtained and reviewed in epic including pulmonology visit from October I consulted Dr. Manuella Ghazi [facility] hospitalist and discussed lab and imaging findings  Critical Interventions: Management of patient's sepsis and respiratory failure.  Goal-directed IV fluids and antibiotics  After the interventions stated above, I reevaluated the patient and found patient still to have soft blood pressures although mentating well.  He is agreeable to plan for admission.   Final Clinical Impression(s) / ED Diagnoses Final diagnoses:  Sepsis with acute hypoxic respiratory failure, due to unspecified organism, unspecified whether septic shock present ([place])  AKI (acute kidney injury) ([place])  Community  acquired pneumonia of left upper lobe of lung    Rx / DC Orders ED Discharge Orders     None        Hayden Rasmussen, MD 10/29/21 1801

## 2021-10-29 NOTE — Sepsis Progress Note (Signed)
Sepsis protocol is being followed by [facility]. 

## 2021-10-30 ENCOUNTER — Inpatient Hospital Stay (HOSPITAL_COMMUNITY): Payer: No Typology Code available for payment source

## 2021-10-30 LAB — CBC
HCT: 34.1 % — ABNORMAL LOW (ref 39.0–52.0)
Hemoglobin: 11.8 g/dL — ABNORMAL LOW (ref 13.0–17.0)
MCH: 33.3 pg (ref 26.0–34.0)
MCHC: 34.6 g/dL (ref 30.0–36.0)
MCV: 96.3 fL (ref 80.0–100.0)
Platelets: 341 10*3/uL (ref 150–400)
RBC: 3.54 MIL/uL — ABNORMAL LOW (ref 4.22–5.81)
RDW: 12.9 % (ref 11.5–15.5)
WBC: 21.2 10*3/uL — ABNORMAL HIGH (ref 4.0–10.5)
nRBC: 0 % (ref 0.0–0.2)

## 2021-10-30 LAB — COMPREHENSIVE METABOLIC PANEL
ALT: 14 U/L (ref 0–44)
AST: 15 U/L (ref 15–41)
Albumin: 2.4 g/dL — ABNORMAL LOW (ref 3.5–5.0)
Alkaline Phosphatase: 87 U/L (ref 38–126)
Anion gap: 9 (ref 5–15)
BUN: 22 mg/dL (ref 8–23)
CO2: 24 mmol/L (ref 22–32)
Calcium: 8 mg/dL — ABNORMAL LOW (ref 8.9–10.3)
Chloride: 103 mmol/L (ref 98–111)
Creatinine, Ser: 0.9 mg/dL (ref 0.61–1.24)
GFR, Estimated: >60 mL/min (ref >60)
Glucose, Bld: 110 mg/dL — ABNORMAL HIGH (ref 70–99)
Potassium: 3.2 mmol/L — ABNORMAL LOW (ref 3.5–5.1)
Sodium: 136 mmol/L (ref 135–145)
Total Bilirubin: 0.6 mg/dL (ref 0.3–1.2)
Total Protein: 5.2 g/dL — ABNORMAL LOW (ref 6.5–8.1)

## 2021-10-30 LAB — STREP PNEUMONIAE URINARY ANTIGEN: Strep Pneumo Urinary Antigen: NEGATIVE

## 2021-10-30 LAB — D-DIMER, QUANTITATIVE: D-Dimer, Quant: 1.61 ug/mL-FEU — ABNORMAL HIGH (ref 0.00–0.50)

## 2021-10-30 LAB — URINE CULTURE: Culture: NO GROWTH

## 2021-10-30 LAB — LACTIC ACID, PLASMA: Lactic Acid, Venous: 2.1 mmol/L (ref 0.5–1.9)

## 2021-10-30 LAB — MAGNESIUM: Magnesium: 1.4 mg/dL — ABNORMAL LOW (ref 1.7–2.4)

## 2021-10-30 LAB — PROCALCITONIN: Procalcitonin: 2.52 ng/mL

## 2021-10-30 LAB — MRSA NEXT GEN BY PCR, NASAL: MRSA by PCR Next Gen: NOT DETECTED

## 2021-10-30 MED ORDER — IPRATROPIUM-ALBUTEROL 0.5-2.5 (3) MG/3ML IN SOLN
3.0000 mL | Freq: Four times a day (QID) | RESPIRATORY_TRACT | Status: DC | PRN
  Administered 2021-10-30 – 2021-11-03 (×9): 3 mL via RESPIRATORY_TRACT
  Filled 2021-10-30 (×7): qty 3

## 2021-10-30 MED ORDER — POLYETHYLENE GLYCOL 3350 17 G PO PACK
17.0000 g | PACK | Freq: Every day | ORAL | Status: DC
  Administered 2021-10-31 – 2021-11-02 (×3): 17 g via ORAL
  Filled 2021-10-30 (×4): qty 1

## 2021-10-30 MED ORDER — HYDRALAZINE HCL 20 MG/ML IJ SOLN: 10.0000 mg | INTRAMUSCULAR | Status: DC | PRN

## 2021-10-30 MED ORDER — MELATONIN 3 MG PO TABS
6.0000 mg | ORAL_TABLET | Freq: Once | ORAL | Status: AC
  Administered 2021-10-31: 6 mg via ORAL
  Filled 2021-10-30: qty 2

## 2021-10-30 MED ORDER — POTASSIUM CHLORIDE CRYS ER 20 MEQ PO TBCR
40.0000 meq | EXTENDED_RELEASE_TABLET | Freq: Once | ORAL | Status: AC
  Administered 2021-10-30: 40 meq via ORAL

## 2021-10-30 MED ORDER — MAGNESIUM SULFATE 4 GM/100ML IV SOLN
4.0000 g | Freq: Once | INTRAVENOUS | Status: AC
  Administered 2021-10-30: 4 g via INTRAVENOUS

## 2021-10-30 MED ORDER — IOHEXOL 350 MG/ML SOLN
100.0000 mL | Freq: Once | INTRAVENOUS | Status: AC | PRN
  Administered 2021-10-30: 100 mL via INTRAVENOUS

## 2021-10-30 MED ORDER — MAGNESIUM SULFATE 2 GM/50ML IV SOLN: 2.0000 g | Freq: Once | INTRAVENOUS | Status: DC

## 2021-10-30 MED ORDER — CHLORHEXIDINE GLUCONATE CLOTH 2 % EX PADS
6.0000 | MEDICATED_PAD | Freq: Every day | CUTANEOUS | Status: DC
  Administered 2021-10-29 – 2021-11-03 (×6): 6 via TOPICAL

## 2021-10-30 NOTE — Progress Notes (Signed)
Report called and given to Lauren, LPN on Dept. 276. Pt to be transported via bed to room 308.

## 2021-10-30 NOTE — Progress Notes (Signed)
Patient transferred from ICU in stable condition , alert and oriented x 4.  VSS.    10/30/21 1623  Vitals  Temp 98.2 F (36.8 C)  Temp Source Oral  BP 126/62  MAP (mmHg) 80  BP Method Automatic  Pulse Rate 98  Pulse Rate Source Monitor  Level of Consciousness  Level of Consciousness Alert  MEWS COLOR  MEWS Score Color Green  Oxygen Therapy  SpO2 92 %  O2 Device Room Air  MEWS Score  MEWS Temp 0  MEWS Systolic 0  MEWS Pulse 0  MEWS RR 1  MEWS LOC 0  MEWS Score 1

## 2021-10-30 NOTE — Progress Notes (Addendum)
PROGRESS NOTE    Roberto Sanchez.  MEQ:[id number] DOB: 1946/05/30 DOA: 10/29/2021 PCP: [name]   Brief Narrative:   Roberto Sanchez. is a 75 y.o. male with medical history significant for dyslipidemia, hypertension, GERD, asthma, and anxiety who was brought in by his wife to the ED for evaluation of left-sided chest pain as well as some shortness of breath that began approximately 2-3 days ago.  He has been admitted with severe sepsis secondary to community-acquired pneumonia.  He has had CT chest angiogram 12/6 with no findings of PE, but significant pneumonias noted to the right side.  He is in stable condition for transfer to telemetry.  Assessment & Plan:   Principal Problem:   Severe sepsis ([place])   Severe sepsis secondary to community-acquired pneumonia with lactic acidosis -Continue Rocephin and azithromycin -Monitor lactic acid and procalcitonin which are slowly downtrending -Monitor microbiology with no growth noted thus far -Okay to transfer to telemetry floor   AKI-resolved -Baseline creatinine 0.7-0.8 -Hold further IV fluid -Follow labs  Hypomagnesemia/hypokalemia -Replete and recheck in a.m.   Elevated D-dimer -CTA chest performed 12/6 with no PE   History of asthma -No acute bronchospasms currently -Breathing treatments as needed   Hypertension -Beginning to elevate now with sepsis physiology resolving -IV hydralazine as needed for now -May resume home blood pressure medications of HCTZ and ACE inhibitor if renal functions continue to remain stable   Dyslipidemia -Hold home Zetia   GERD -Continue on PPI   Anxiety -Continue home medications   DVT prophylaxis: Heparin Code Status: Full Family Communication: Spoke with spouse on phone 12/6 Disposition Plan:  Status is: Inpatient  Remains inpatient appropriate because: Continues to require IV antibiotics.   Consultants:  None  Procedures:  See below  Antimicrobials:   Anti-infectives (From admission, onward)    Start     Dose/Rate Route Frequency Ordered Stop   10/30/21 1200  azithromycin (ZITHROMAX) 500 mg in sodium chloride 0.9 % 250 mL IVPB        500 mg 250 mL/hr over 60 Minutes Intravenous Every 24 hours 10/29/21 1441     10/30/21 0600  cefTRIAXone (ROCEPHIN) 2 g in sodium chloride 0.9 % 100 mL IVPB        2 g 200 mL/hr over 30 Minutes Intravenous Every 24 hours 10/29/21 1441     10/29/21 1245  cefTRIAXone (ROCEPHIN) 1 g in sodium chloride 0.9 % 100 mL IVPB        1 g 200 mL/hr over 30 Minutes Intravenous  Once 10/29/21 1235 10/29/21 1330   10/29/21 1245  azithromycin (ZITHROMAX) 500 mg in sodium chloride 0.9 % 250 mL IVPB        500 mg 250 mL/hr over 60 Minutes Intravenous  Once 10/29/21 1235 10/29/21 1343       Subjective: Patient seen and evaluated today with no new acute complaints or concerns. No acute concerns or events noted overnight.  He continues to have some ongoing chest tightness and shortness of breath that is persisting.  He denies any chest pain and has been weaned off of oxygen supplementation.  Objective: Vitals:   10/30/21 1019 10/30/21 1100 10/30/21 1128 10/30/21 1210  BP:  (!) 151/62  (!) 158/69  Pulse: 97 94 98 (!) 105  Resp: 20 (!) 28 20 (!) 30  Temp:   99.5 F (37.5 C)   TempSrc:   Oral   SpO2: 93% (!) 89% 94% 93%  Weight:  Height:        Intake/Output Summary (Last 24 hours) at 10/30/2021 1307 Last data filed at 10/30/2021 0959 Gross per 24 hour  Intake 3111.83 ml  Output 2200 ml  Net 911.83 ml   Filed Weights   10/29/21 1233 10/29/21 1926 10/30/21 0548  Weight: 95.3 kg 94.8 kg 93.5 kg    Examination:  General exam: Appears calm and comfortable  Respiratory system: Clear to auscultation. Respiratory effort normal. Cardiovascular system: S1 & S2 heard, RRR.  Gastrointestinal system: Abdomen is soft Central nervous system: Alert and awake Extremities: No edema Skin: No significant lesions  noted Psychiatry: Flat affect.    Data Reviewed: I have personally reviewed following labs and imaging studies  CBC: Recent Labs  Lab 10/29/21 1212 10/30/21 0409  WBC 23.9* 21.2*  NEUTROABS 22.5*  --   HGB 15.0 11.8*  HCT 43.1 34.1*  MCV 98.0 96.3  PLT 397 481   Basic Metabolic Panel: Recent Labs  Lab 10/29/21 1212 10/30/21 0409  NA 134* 136  K 3.5 3.2*  CL 99 103  CO2 21* 24  GLUCOSE 128* 110*  BUN 28* 22  CREATININE 1.90* 0.90  CALCIUM 8.5* 8.0*  MG  --  1.4*   GFR: Estimated Creatinine Clearance: 81.5 mL/min (by C-G formula based on SCr of 0.9 mg/dL). Liver Function Tests: Recent Labs  Lab 10/29/21 1212 10/30/21 0409  AST 21 15  ALT 20 14  ALKPHOS 111 87  BILITOT 1.4* 0.6  PROT 6.7 5.2*  ALBUMIN 3.2* 2.4*   No results for input(s): LIPASE, AMYLASE in the last 168 hours. No results for input(s): AMMONIA in the last 168 hours. Coagulation Profile: Recent Labs  Lab 10/29/21 1212  INR 1.1   Cardiac Enzymes: No results for input(s): CKTOTAL, CKMB, CKMBINDEX, TROPONINI in the last 168 hours. BNP (last 3 results) No results for input(s): PROBNP in the last 8760 hours. HbA1C: No results for input(s): HGBA1C in the last 72 hours. CBG: No results for input(s): GLUCAP in the last 168 hours. Lipid Profile: No results for input(s): CHOL, HDL, LDLCALC, TRIG, CHOLHDL, LDLDIRECT in the last 72 hours. Thyroid Function Tests: No results for input(s): TSH, T4TOTAL, FREET4, T3FREE, THYROIDAB in the last 72 hours. Anemia Panel: No results for input(s): VITAMINB12, FOLATE, FERRITIN, TIBC, IRON, RETICCTPCT in the last 72 hours. Sepsis Labs: Recent Labs  Lab 10/29/21 1212 10/29/21 1358 10/30/21 0409  PROCALCITON  --  2.57 2.52  LATICACIDVEN 5.5* 5.0* 2.1*    Recent Results (from the past 240 hour(s))  Blood Culture (routine x 2)     Status: None (Preliminary result)   Collection Time: 10/29/21 12:12 PM   Specimen: Left Antecubital; Blood  Result Value  Ref Range Status   Specimen Description LEFT ANTECUBITAL  Final   Special Requests   Final    BOTTLES DRAWN AEROBIC AND ANAEROBIC Blood Culture adequate volume   Culture   Final    NO GROWTH < 24 HOURS Performed at [facility], [address] [place], [place] [address]    Report Status PENDING  Incomplete  Blood Culture (routine x 2)     Status: None (Preliminary result)   Collection Time: 10/29/21 12:12 PM   Specimen: Right Antecubital; Blood  Result Value Ref Range Status   Specimen Description RIGHT ANTECUBITAL  Final   Special Requests   Final    BOTTLES DRAWN AEROBIC AND ANAEROBIC Blood Culture adequate volume   Culture   Final    NO GROWTH <  69 HOURS Performed at [facility], [address] [place], [place] [address]    Report Status PENDING  Incomplete  Resp Panel by RT-PCR (Flu A&B, Covid) Nasopharyngeal Swab     Status: None   Collection Time: 10/29/21 12:12 PM   Specimen: Nasopharyngeal Swab; Nasopharyngeal(NP) swabs in vial transport medium  Result Value Ref Range Status   SARS Coronavirus 2 by RT PCR NEGATIVE NEGATIVE Final    Comment: (NOTE) SARS-CoV-2 target nucleic acids are NOT DETECTED.  The SARS-CoV-2 RNA is generally detectable in upper respiratory specimens during the acute phase of infection. The lowest concentration of SARS-CoV-2 viral copies this assay can detect is 138 copies/mL. A negative result does not preclude SARS-Cov-2 infection and should not be used as the sole basis for treatment or other patient management decisions. A negative result may occur with  improper specimen collection/handling, submission of specimen other than nasopharyngeal swab, presence of viral mutation(s) within the areas targeted by this assay, and inadequate number of viral copies(<138 copies/mL). A negative result must be combined with clinical observations, patient history, and epidemiological information. The expected result is Negative.  Fact Sheet for Patients:   EntrepreneurPulse.com.au  Fact Sheet for Healthcare Providers:  IncredibleEmployment.be  This test is no t yet approved or cleared by the [place] FDA and  has been authorized for detection and/or diagnosis of SARS-CoV-2 by FDA under an Emergency Use Authorization (EUA). This EUA will remain  in effect (meaning this test can be used) for the duration of the COVID-19 declaration under Section 564(b)(1) of the Act, 21 U.S.C.section 360bbb-3(b)(1), unless the authorization is terminated  or revoked sooner.       Influenza A by PCR NEGATIVE NEGATIVE Final   Influenza B by PCR NEGATIVE NEGATIVE Final    Comment: (NOTE) The Xpert Xpress SARS-CoV-2/FLU/RSV plus assay is intended as an aid in the diagnosis of influenza from Nasopharyngeal swab specimens and should not be used as a sole basis for treatment. Nasal washings and aspirates are unacceptable for Xpert Xpress SARS-CoV-2/FLU/RSV testing.  Fact Sheet for Patients: EntrepreneurPulse.com.au  Fact Sheet for Healthcare Providers: IncredibleEmployment.be  This test is not yet approved or cleared by the [place] FDA and has been authorized for detection and/or diagnosis of SARS-CoV-2 by FDA under an Emergency Use Authorization (EUA). This EUA will remain in effect (meaning this test can be used) for the duration of the COVID-19 declaration under Section 564(b)(1) of the Act, 21 U.S.C. section 360bbb-3(b)(1), unless the authorization is terminated or revoked.  Performed at [facility], [address], [place], [place] [address]   MRSA Next Gen by PCR, Nasal     Status: None   Collection Time: 10/29/21  7:44 PM   Specimen: Nasal Mucosa; Nasal Swab  Result Value Ref Range Status   MRSA by PCR Next Gen NOT DETECTED NOT DETECTED Final    Comment: (NOTE) The GeneXpert MRSA Assay (FDA approved for NASAL specimens only), is one component of a  comprehensive MRSA colonization surveillance program. It is not intended to diagnose MRSA infection nor to guide or monitor treatment for MRSA infections. Test performance is not FDA approved in patients less than 58 years old. Performed at [facility], [address], [place], [place] [address]          Radiology Studies: CT Angio Chest Pulmonary Embolism (PE) W or WO Contrast  Result Date: 10/30/2021 CLINICAL DATA:  Rule out pulmonary embolus. Left-sided chest pain with cough and shortness of breath. EXAM: CT  ANGIOGRAPHY CHEST WITH CONTRAST TECHNIQUE: Multidetector CT imaging of the chest was performed using the standard protocol during bolus administration of intravenous contrast. Multiplanar CT image reconstructions and MIPs were obtained to evaluate the vascular anatomy. CONTRAST:  136mL OMNIPAQUE IOHEXOL 350 MG/ML SOLN COMPARISON:  CT chest 12/04/2016 FINDINGS: Cardiovascular: Satisfactory opacification of the pulmonary arteries to the segmental level. No evidence of pulmonary embolism. Normal heart size. No pericardial effusion. Mediastinum/Nodes: No enlarged mediastinal, hilar, or axillary lymph nodes. Thyroid gland, trachea, and esophagus demonstrate no significant findings. Lungs/Pleura: Small left pleural effusion with overlying atelectasis. There is dense airspace consolidation with surrounding ground-glass attenuation within the posterior left upper lobe and left apex. Air bronchograms can be seen coursing through this area. Patchy areas of ground-glass attenuation are noted within the right upper lobe, image 54/6 and image 78/6. Upper Abdomen: No acute abnormality. Low-density structure and right lobe of liver is too small to characterize measuring 9 mm, image 91/4. This was present on 01/27/2012 and likely represents a benign abnormality such as a cyst. Musculoskeletal: Thoracic spondylosis identified. No acute or suspicious osseous findings. Review of the MIP images confirms the above  findings. IMPRESSION: 1. No evidence for acute pulmonary embolus. 2. Dense airspace consolidation with surrounding ground-glass attenuation comprising greater than 50% of the posterior right upper lobe and right apex. Findings are favored to represent pneumonia. Follow-up imaging is advised to ensure resolution and exclude underlying malignancy. 3. Small left pleural effusion with overlying atelectasis. Electronically Signed   By: Kerby Moors M.D.   On: 10/30/2021 12:24   DG Chest Port 1 View  Result Date: 10/29/2021 CLINICAL DATA:  Chest pain, shortness of breath EXAM: PORTABLE CHEST 1 VIEW COMPARISON:  09/03/2021 FINDINGS: The heart size and mediastinal contours are within normal limits. Hazy left upper lobe airspace opacity. Streaky left basilar opacity. No large pleural effusion. No pneumothorax. IMPRESSION: Hazy left upper lobe airspace opacity suspicious for pneumonia. Streaky left basilar opacity likely atelectasis. Electronically Signed   By: Davina Poke D.O.   On: 10/29/2021 12:38        Scheduled Meds:  ALPRAZolam  0.5 mg Oral QHS   aspirin EC  81 mg Oral QHS   Chlorhexidine Gluconate Cloth  6 each Topical Daily   DULoxetine  20 mg Oral QHS   heparin  5,000 Units Subcutaneous Q8H   mometasone-formoterol  2 puff Inhalation BID   montelukast  10 mg Oral Daily   pantoprazole  40 mg Oral Daily   Continuous Infusions:  azithromycin 500 mg (10/30/21 1137)   cefTRIAXone (ROCEPHIN)  IV 2 g (10/30/21 0542)     LOS: 1 day    Time spent: 35 minutes    Barrett Goldie D Manuella Ghazi, DO [facility]  If 7PM-7AM, please contact night-coverage [email or web address] 10/30/2021, 1:07 PM

## 2021-10-31 ENCOUNTER — Inpatient Hospital Stay (HOSPITAL_COMMUNITY): Payer: No Typology Code available for payment source

## 2021-10-31 DIAGNOSIS — E876 Hypokalemia: Secondary | ICD-10-CM

## 2021-10-31 DIAGNOSIS — I4891 Unspecified atrial fibrillation: Secondary | ICD-10-CM

## 2021-10-31 DIAGNOSIS — J45909 Unspecified asthma, uncomplicated: Secondary | ICD-10-CM

## 2021-10-31 DIAGNOSIS — E785 Hyperlipidemia, unspecified: Secondary | ICD-10-CM

## 2021-10-31 DIAGNOSIS — N179 Acute kidney failure, unspecified: Secondary | ICD-10-CM

## 2021-10-31 DIAGNOSIS — R7989 Other specified abnormal findings of blood chemistry: Secondary | ICD-10-CM

## 2021-10-31 DIAGNOSIS — J189 Pneumonia, unspecified organism: Secondary | ICD-10-CM

## 2021-10-31 DIAGNOSIS — I10 Essential (primary) hypertension: Secondary | ICD-10-CM

## 2021-10-31 LAB — BASIC METABOLIC PANEL
Anion gap: 8 (ref 5–15)
BUN: 19 mg/dL (ref 8–23)
CO2: 25 mmol/L (ref 22–32)
Calcium: 8.3 mg/dL — ABNORMAL LOW (ref 8.9–10.3)
Chloride: 104 mmol/L (ref 98–111)
Creatinine, Ser: 0.73 mg/dL (ref 0.61–1.24)
GFR, Estimated: >60 mL/min (ref >60)
Glucose, Bld: 101 mg/dL — ABNORMAL HIGH (ref 70–99)
Potassium: 3.3 mmol/L — ABNORMAL LOW (ref 3.5–5.1)
Sodium: 137 mmol/L (ref 135–145)

## 2021-10-31 LAB — CBC
HCT: 36.4 % — ABNORMAL LOW (ref 39.0–52.0)
Hemoglobin: 12.2 g/dL — ABNORMAL LOW (ref 13.0–17.0)
MCH: 32.8 pg (ref 26.0–34.0)
MCHC: 33.5 g/dL (ref 30.0–36.0)
MCV: 97.8 fL (ref 80.0–100.0)
Platelets: 410 10*3/uL — ABNORMAL HIGH (ref 150–400)
RBC: 3.72 MIL/uL — ABNORMAL LOW (ref 4.22–5.81)
RDW: 13.2 % (ref 11.5–15.5)
WBC: 22.8 10*3/uL — ABNORMAL HIGH (ref 4.0–10.5)
nRBC: 0 % (ref 0.0–0.2)

## 2021-10-31 LAB — ECHOCARDIOGRAM COMPLETE
AR max vel: 2.38 cm2
AV Area VTI: 2.38 cm2
AV Area mean vel: 2.26 cm2
AV Mean grad: 8 mmHg
AV Peak grad: 15.1 mmHg
Ao pk vel: 1.94 m/s
Area-P 1/2: 4.12 cm2
Calc EF: 65.5 %
Height: 70 in
MV VTI: 2.82 cm2
S' Lateral: 2.5 cm
Single Plane A2C EF: 72.4 %
Single Plane A4C EF: 60.7 %
Weight: 3224.01 oz

## 2021-10-31 LAB — MAGNESIUM: Magnesium: 2 mg/dL (ref 1.7–2.4)

## 2021-10-31 LAB — PROCALCITONIN: Procalcitonin: 1.74 ng/mL

## 2021-10-31 LAB — LACTIC ACID, PLASMA: Lactic Acid, Venous: 1.2 mmol/L (ref 0.5–1.9)

## 2021-10-31 MED ORDER — DILTIAZEM HCL 25 MG/5ML IV SOLN
5.0000 mg | Freq: Once | INTRAVENOUS | Status: AC
  Administered 2021-10-31: 5 mg via INTRAVENOUS
  Filled 2021-10-31: qty 5

## 2021-10-31 MED ORDER — DILTIAZEM HCL-DEXTROSE 125-5 MG/125ML-% IV SOLN (PREMIX)
5.0000 mg/h | INTRAVENOUS | Status: DC
  Administered 2021-10-31: 5 mg/h via INTRAVENOUS
  Filled 2021-10-31 (×2): qty 125

## 2021-10-31 MED ORDER — DILTIAZEM LOAD VIA INFUSION
10.0000 mg | Freq: Once | INTRAVENOUS | Status: AC
  Administered 2021-10-31: 10 mg via INTRAVENOUS
  Filled 2021-10-31: qty 10

## 2021-10-31 MED ORDER — METOPROLOL TARTRATE 5 MG/5ML IV SOLN
INTRAVENOUS | Status: AC
  Administered 2021-10-31: 5 mg
  Filled 2021-10-31: qty 5

## 2021-10-31 MED ORDER — DILTIAZEM HCL 30 MG PO TABS
30.0000 mg | ORAL_TABLET | Freq: Four times a day (QID) | ORAL | Status: DC
  Administered 2021-10-31 – 2021-11-01 (×4): 30 mg via ORAL
  Filled 2021-10-31 (×4): qty 1

## 2021-10-31 MED ORDER — POTASSIUM CHLORIDE CRYS ER 20 MEQ PO TBCR
40.0000 meq | EXTENDED_RELEASE_TABLET | Freq: Once | ORAL | Status: DC
  Filled 2021-10-31: qty 2

## 2021-10-31 MED ORDER — POTASSIUM CHLORIDE 10 MEQ/100ML IV SOLN
10.0000 meq | INTRAVENOUS | Status: AC
  Administered 2021-10-31 (×2): 10 meq via INTRAVENOUS
  Filled 2021-10-31 (×2): qty 100

## 2021-10-31 MED ORDER — DILTIAZEM HCL 30 MG PO TABS: 30.0000 mg | ORAL_TABLET | Freq: Three times a day (TID) | ORAL | Status: DC

## 2021-10-31 MED ORDER — APIXABAN 5 MG PO TABS
5.0000 mg | ORAL_TABLET | Freq: Two times a day (BID) | ORAL | Status: DC
  Administered 2021-10-31 – 2021-11-03 (×7): 5 mg via ORAL
  Filled 2021-10-31 (×7): qty 1

## 2021-10-31 MED ORDER — METOPROLOL TARTRATE 5 MG/5ML IV SOLN
5.0000 mg | Freq: Once | INTRAVENOUS | Status: AC
  Administered 2021-10-31: 5 mg via INTRAVENOUS

## 2021-10-31 NOTE — Hospital Course (Addendum)
Roberto Sanchez. Is a 75 y.o. male with a history of hyperlipidemia, GERD, asthma and anxiety. Patient presented secondary to left sided chest pain and dyspnea and was found to have evidence of sepsis from community acquired pneumonia. Patient started on empiric antibiotics and is improving. While admitted, he developed atrial fibrillation with RVR and was started on a Cardizem IV infusion. Cardiology was consulted for persistent tachycardia while on max Cardizem, but patient converted back to sinus rhythm shortly after. Eliquis started for stroke prevention and patient transitioned to PO Cardizem.

## 2021-10-31 NOTE — Assessment & Plan Note (Addendum)
Patient is on omeprazole as an outpatient. Continue on discharge.

## 2021-10-31 NOTE — Assessment & Plan Note (Signed)
CTA chest without evidence of acute PE. No clinical indication of LE DVT.

## 2021-10-31 NOTE — Assessment & Plan Note (Addendum)
Continue Dulera.  DuoNeb ordered on discharge.

## 2021-10-31 NOTE — Assessment & Plan Note (Addendum)
Patient is on hydrochlorothiazide, losartan as an outpatient. Antihypertensives held on admission secondary to sepsis. Started on Cardizem for atrial fibrillation. Continue Cardizem on discharge. Discontinue hydrochlorothiazide and losartan on discharge.

## 2021-10-31 NOTE — Assessment & Plan Note (Addendum)
Likely secondary to pneumonia.  Blood cultures with no growth to date. Urine culture without growth. Procalcitonin trending down. Leukocytosis trended down with treatment.

## 2021-10-31 NOTE — Progress Notes (Signed)
*  PRELIMINARY RESULTS* Echocardiogram 2D Echocardiogram has been performed.  Roberto Sanchez 10/31/2021, 1:28 PM

## 2021-10-31 NOTE — Assessment & Plan Note (Addendum)
Multifocal pneumonia. Empirically treated with Ceftriaxone and azithromycin with improvement. Chest x-ray with interval worsening, but patient with significant improvement. Patient completed 6 days of Azithromycin and 6 days of Ceftriaxone IV. Will discharge with 4 days of Vantin PO. Recommend repeat chest x-ray in 3 to 4 weeks to ensure resolution.

## 2021-10-31 NOTE — Assessment & Plan Note (Addendum)
New onset and in setting of acute illness. HR resting between 120-140 bpm. Blood pressure supportive. Given Cardizem 5 mg and metoprolol 5 mg IV without improvement of HR. Cardizem IV drip started and maxed out without improvement in HR, so cardiology was consulted. Shortly after consultation, patient converted back into sinus rhythm and is now transitioned to Cardizem PO. Transthoracic Echocardiogram unremarkable.  Patient discharged on Cardizem CD 120 mg daily and Eliquis 5 mg twice daily.  Patient to follow-up with cardiology as an outpatient.

## 2021-10-31 NOTE — Plan of Care (Signed)
  Problem: Education: Goal: Knowledge of General Education information will improve Description: Including pain rating scale, medication(s)/side effects and non-pharmacologic comfort measures Outcome: Progressing   Problem: Health Behavior/Discharge Planning: Goal: Ability to manage health-related needs will improve Outcome: Progressing   Problem: Clinical Measurements: Goal: Respiratory complications will improve Outcome: Progressing   Problem: Activity: Goal: Risk for activity intolerance will decrease Outcome: Progressing   Problem: Nutrition: Goal: Adequate nutrition will be maintained Outcome: Progressing   Problem: Coping: Goal: Level of anxiety will decrease Outcome: Progressing   Problem: Elimination: Goal: Will not experience complications related to bowel motility Outcome: Progressing   Problem: Elimination: Goal: Will not experience complications related to urinary retention Outcome: Progressing   Problem: Pain Managment: Goal: General experience of comfort will improve Outcome: Progressing   Problem: Safety: Goal: Ability to remain free from injury will improve Outcome: Progressing

## 2021-10-31 NOTE — Progress Notes (Incomplete)
Patient seen and discussed with PA Ahmed Prima, I agree with her documentation. 75 yo male history of hyperlipidemia, HTN, asthma, admitted with chest pain and SOB. Diagnosed with pneumonia sepsis,     Lactic acid 5.5 K 3.5 Cr 1.9 BUN 28 WBC 23.9 Hgb 15 Plt 397 BNP 142 Ddimer 1.30 Procalc 2.57  Trop 5-->3 COVID neg Flu neg CXR hazy LEL opacity CT PE no PE, RUL infiltrate EKG SR, RBBB/LAFB/ Subsequent EKG afib with RVR    Patient presents with pneumonia/sepsis, in this setting new diagnosis of afib with RVR. STarted on dilt gtt for rate c

## 2021-10-31 NOTE — Assessment & Plan Note (Addendum)
-   Continue Xanax 

## 2021-10-31 NOTE — Assessment & Plan Note (Signed)
Patient is on omega 3 fatty acids as an outpatient

## 2021-10-31 NOTE — Progress Notes (Signed)
PROGRESS NOTE    Roberto Sanchez.  NLG:[id number] DOB: [birth year] DOA: 10/29/2021 PCP: [name]   Brief Narrative: Roberto Sanchez. Is a 75 y.o. male with a history of hyperlipidemia, GERD, asthma and anxiety. Patient presented secondary to left sided chest pain and dyspnea and was found to have evidence of sepsis from community acquired pneumonia. Patient started on empiric antibiotics and is improving. While admitted, he developed atrial fibrillation with RVR.   Assessment & Plan:   * Severe sepsis ([place]) Likely secondary to pneumonia.  Blood cultures with no growth to date. Urine culture without growth. Procalcitonin trending down. Leukocytosis persistently elevated and patient with a Tmax of 99.5 F this admission. -Trend CBC -Continue pneumonia treatment  Hyperlipidemia Patient is on omega 3 fatty acids as an outpatient  Elevated d-dimer CTA chest without evidence of acute PE. No clinical indication of LE DVT.  Community acquired pneumonia Multifocal pneumonia. Empirically treated with Ceftriaxone and azithromycin with progressive improvement. -Continue Ceftriaxone/Azithromycin  Atrial fibrillation with RVR ([place]) New onset and in setting of acute illness. HR resting between 120-140 bpm. Blood pressure supportive. Given Cardizem 5 mg and metoprolol 5 mg IV without improvement of HR. Cardizem IV drip started and maxed out without improvement in HR, so cardiology was consulted. Shortly after consultation, patient converted back into sinus rhythm. -Continue Cardizem, Eliquis -Cardiology recommendations: plan to transition to oral diltiazem if remains controlled -Transthoracic Echocardiogram  GERD (gastroesophageal reflux disease) Patient is on omeprazole as an outpatient -Continue Protonix  Anxiety -Continue Xanax  Asthma, chronic -Continue Dulera  Essential hypertension Patient is on hydrochlorothiazide, losartan as an outpatient. Antihypertensives  held on admission secondary to sepsis. -Continue to hold home antihypertensives in setting of atrial fibrillation with RVR  AKI (acute kidney injury) (HCC)-resolved as of 10/31/2021 Secondary to sepsis and acute infection. Baseline creatinine of 0.7-0.9 with creatinine of 1.9 on admission. Resolved with IV fluids.    DVT prophylaxis: Eliquis Code Status:   Code Status: Full Code Family Communication: None at bedside Disposition Plan: Discharge home vs SNF likely in 2-3 days pending control of afib, improvement of pneumonia, PT/OT recommendations   Consultants:  Cardiology  Procedures:  None  Antimicrobials: Ceftriaxone Azithromycin    Subjective: Dyspnea was a little worse this afternoon, correlating with rapid heart rate. No chest pain. No other concerns.  Objective: Vitals:   10/30/21 2042 10/30/21 2300 10/31/21 0720 10/31/21 0724  BP:  129/70 123/74   Pulse:  90 (!) 131   Resp:  20    Temp:  98.1 F (36.7 C)    TempSrc:  Oral    SpO2: 94% 95% 94% 96%  Weight:      Height:        Intake/Output Summary (Last 24 hours) at 10/31/2021 0726 Last data filed at 10/31/2021 0600 Gross per 24 hour  Intake 663.42 ml  Output 1950 ml  Net -1286.58 ml   Filed Weights   10/29/21 1233 10/29/21 1926 10/30/21 0548  Weight: 95.3 kg 94.8 kg 93.5 kg    Examination:  General exam: Appears calm and comfortable and in no acute distress. Conversant Respiratory: Rales and diminished in left lobes, clear on right. Respiratory effort normal with no intercostal retractions or use of accessory muscles Cardiovascular: S1 & S2 heard, irregular rhythm with fast heart rate. No murmurs, rubs, gallops or clicks. No LE edema Gastrointestinal: Abdomen is non-distended, soft and non-tender. No masses felt. Normal bowel sounds heard Neurologic: No focal neurological deficits  Musculoskeletal: No calf tenderness Skin: No cyanosis. No new rashes Psychiatry: Alert and oriented. Memory intact. Mood &  affect appropriate    Data Reviewed: I have personally reviewed following labs and imaging studies  CBC Lab Results  Component Value Date   WBC 22.8 (H) 10/31/2021   RBC 3.72 (L) 10/31/2021   HGB 12.2 (L) 10/31/2021   HCT 36.4 (L) 10/31/2021   MCV 97.8 10/31/2021   MCH 32.8 10/31/2021   PLT 410 (H) 10/31/2021   MCHC 33.5 10/31/2021   RDW 13.2 10/31/2021   LYMPHSABS 1.4 10/29/2021   MONOABS 0.0 (L) 10/29/2021   EOSABS 0.0 10/29/2021   BASOSABS 0.0 38/46/6599     Last metabolic panel Lab Results  Component Value Date   NA 137 10/31/2021   K 3.3 (L) 10/31/2021   CL 104 10/31/2021   CO2 25 10/31/2021   BUN 19 10/31/2021   CREATININE 0.73 10/31/2021   GLUCOSE 101 (H) 10/31/2021   GFRNONAA >60 10/31/2021   GFRAA >60 11/13/2018   CALCIUM 8.3 (L) 10/31/2021   PROT 5.2 (L) 10/30/2021   ALBUMIN 2.4 (L) 10/30/2021   BILITOT 0.6 10/30/2021   ALKPHOS 87 10/30/2021   AST 15 10/30/2021   ALT 14 10/30/2021   ANIONGAP 8 10/31/2021    CBG (last 3)  No results for input(s): GLUCAP in the last 72 hours.   GFR: Estimated Creatinine Clearance: 91.6 mL/min (by C-G formula based on SCr of 0.73 mg/dL).  Coagulation Profile: Recent Labs  Lab 10/29/21 1212  INR 1.1    Recent Results (from the past 240 hour(s))  Blood Culture (routine x 2)     Status: None (Preliminary result)   Collection Time: 10/29/21 12:12 PM   Specimen: Left Antecubital; Blood  Result Value Ref Range Status   Specimen Description LEFT ANTECUBITAL  Final   Special Requests   Final    BOTTLES DRAWN AEROBIC AND ANAEROBIC Blood Culture adequate volume   Culture   Final    NO GROWTH < 24 HOURS Performed at [facility], [address], [place], [place] [address]    Report Status PENDING  Incomplete  Blood Culture (routine x 2)     Status: None (Preliminary result)   Collection Time: 10/29/21 12:12 PM   Specimen: Right Antecubital; Blood  Result Value Ref Range Status   Specimen Description RIGHT  ANTECUBITAL  Final   Special Requests   Final    BOTTLES DRAWN AEROBIC AND ANAEROBIC Blood Culture adequate volume   Culture   Final    NO GROWTH < 24 HOURS Performed at [facility], [address] [place], [place] [address]    Report Status PENDING  Incomplete  Resp Panel by RT-PCR (Flu A&B, Covid) Nasopharyngeal Swab     Status: None   Collection Time: 10/29/21 12:12 PM   Specimen: Nasopharyngeal Swab; Nasopharyngeal(NP) swabs in vial transport medium  Result Value Ref Range Status   SARS Coronavirus 2 by RT PCR NEGATIVE NEGATIVE Final    Comment: (NOTE) SARS-CoV-2 target nucleic acids are NOT DETECTED.  The SARS-CoV-2 RNA is generally detectable in upper respiratory specimens during the acute phase of infection. The lowest concentration of SARS-CoV-2 viral copies this assay can detect is 138 copies/mL. A negative result does not preclude SARS-Cov-2 infection and should not be used as the sole basis for treatment or other patient management decisions. A negative result may occur with  improper specimen collection/handling, submission of specimen other than nasopharyngeal swab, presence of viral mutation(s) within  the areas targeted by this assay, and inadequate number of viral copies(<138 copies/mL). A negative result must be combined with clinical observations, patient history, and epidemiological information. The expected result is Negative.  Fact Sheet for Patients:  EntrepreneurPulse.com.au  Fact Sheet for Healthcare Providers:  IncredibleEmployment.be  This test is no t yet approved or cleared by the [place] FDA and  has been authorized for detection and/or diagnosis of SARS-CoV-2 by FDA under an Emergency Use Authorization (EUA). This EUA will remain  in effect (meaning this test can be used) for the duration of the COVID-19 declaration under Section 564(b)(1) of the Act, 21 U.S.C.section 360bbb-3(b)(1), unless the  authorization is terminated  or revoked sooner.       Influenza A by PCR NEGATIVE NEGATIVE Final   Influenza B by PCR NEGATIVE NEGATIVE Final    Comment: (NOTE) The Xpert Xpress SARS-CoV-2/FLU/RSV plus assay is intended as an aid in the diagnosis of influenza from Nasopharyngeal swab specimens and should not be used as a sole basis for treatment. Nasal washings and aspirates are unacceptable for Xpert Xpress SARS-CoV-2/FLU/RSV testing.  Fact Sheet for Patients: EntrepreneurPulse.com.au  Fact Sheet for Healthcare Providers: IncredibleEmployment.be  This test is not yet approved or cleared by the [place] FDA and has been authorized for detection and/or diagnosis of SARS-CoV-2 by FDA under an Emergency Use Authorization (EUA). This EUA will remain in effect (meaning this test can be used) for the duration of the COVID-19 declaration under Section 564(b)(1) of the Act, 21 U.S.C. section 360bbb-3(b)(1), unless the authorization is terminated or revoked.  Performed at [facility], [address] [place], [place] [address]   Urine Culture     Status: None   Collection Time: 10/29/21  2:54 PM   Specimen: Urine, Catheterized  Result Value Ref Range Status   Specimen Description   Final    URINE, CATHETERIZED Performed at [facility], [address] [place], [place] [address]    Special Requests   Final    NONE Performed at [facility], [address] [place], [place] [address]    Culture   Final    NO GROWTH Performed at [facility], [place] [address] [place], [place] [address]    Report Status 10/30/2021 FINAL  Final  MRSA Next Gen by PCR, Nasal     Status: None   Collection Time: 10/29/21  7:44 PM   Specimen: Nasal Mucosa; Nasal Swab  Result Value Ref Range Status   MRSA by PCR Next Gen NOT DETECTED NOT DETECTED Final    Comment: (NOTE) The GeneXpert MRSA Assay (FDA approved for NASAL specimens only), is one component of  a comprehensive MRSA colonization surveillance program. It is not intended to diagnose MRSA infection nor to guide or monitor treatment for MRSA infections. Test performance is not FDA approved in patients less than 57 years old. Performed at [facility], [address], [place], [place] [address]         Radiology Studies: CT Angio Chest Pulmonary Embolism (PE) W or WO Contrast  Result Date: 10/30/2021 CLINICAL DATA:  Rule out pulmonary embolus. Left-sided chest pain with cough and shortness of breath. EXAM: CT ANGIOGRAPHY CHEST WITH CONTRAST TECHNIQUE: Multidetector CT imaging of the chest was performed using the standard protocol during bolus administration of intravenous contrast. Multiplanar CT image reconstructions and MIPs were obtained to evaluate the vascular anatomy. CONTRAST:  174mL OMNIPAQUE IOHEXOL 350 MG/ML SOLN COMPARISON:  CT chest 12/04/2016 FINDINGS: Cardiovascular: Satisfactory opacification of  the pulmonary arteries to the segmental level. No evidence of pulmonary embolism. Normal heart size. No pericardial effusion. Mediastinum/Nodes: No enlarged mediastinal, hilar, or axillary lymph nodes. Thyroid gland, trachea, and esophagus demonstrate no significant findings. Lungs/Pleura: Small left pleural effusion with overlying atelectasis. There is dense airspace consolidation with surrounding ground-glass attenuation within the posterior left upper lobe and left apex. Air bronchograms can be seen coursing through this area. Patchy areas of ground-glass attenuation are noted within the right upper lobe, image 54/6 and image 78/6. Upper Abdomen: No acute abnormality. Low-density structure and right lobe of liver is too small to characterize measuring 9 mm, image 91/4. This was present on 01/27/2012 and likely represents a benign abnormality such as a cyst. Musculoskeletal: Thoracic spondylosis identified. No acute or suspicious osseous findings. Review of the MIP images confirms the above  findings. IMPRESSION: 1. No evidence for acute pulmonary embolus. 2. Dense airspace consolidation with surrounding ground-glass attenuation comprising greater than 50% of the posterior right upper lobe and right apex. Findings are favored to represent pneumonia. Follow-up imaging is advised to ensure resolution and exclude underlying malignancy. 3. Small left pleural effusion with overlying atelectasis. Electronically Signed   By: Kerby Moors M.D.   On: 10/30/2021 12:24   DG Chest Port 1 View  Result Date: 10/29/2021 CLINICAL DATA:  Chest pain, shortness of breath EXAM: PORTABLE CHEST 1 VIEW COMPARISON:  09/03/2021 FINDINGS: The heart size and mediastinal contours are within normal limits. Hazy left upper lobe airspace opacity. Streaky left basilar opacity. No large pleural effusion. No pneumothorax. IMPRESSION: Hazy left upper lobe airspace opacity suspicious for pneumonia. Streaky left basilar opacity likely atelectasis. Electronically Signed   By: Davina Poke D.O.   On: 10/29/2021 12:38        Scheduled Meds:  ALPRAZolam  0.5 mg Oral QHS   aspirin EC  81 mg Oral QHS   Chlorhexidine Gluconate Cloth  6 each Topical Daily   DULoxetine  20 mg Oral QHS   heparin  5,000 Units Subcutaneous Q8H   melatonin  6 mg Oral Once   metoprolol tartrate  5 mg Intravenous Once   mometasone-formoterol  2 puff Inhalation BID   montelukast  10 mg Oral Daily   pantoprazole  40 mg Oral Daily   polyethylene glycol  17 g Oral Daily   Continuous Infusions:  azithromycin Stopped (10/30/21 1149)   cefTRIAXone (ROCEPHIN)  IV 2 g (10/31/21 0617)   potassium chloride       LOS: 2 days     Cordelia Poche, MD [facility] 10/31/2021, 7:26 AM  If 7PM-7AM, please contact night-coverage [email or web address]

## 2021-10-31 NOTE — Progress Notes (Signed)
RN called due to patient going into A. fib with RVR.  EKG was ordered and showed A. fib with RVR This was possibly precipitated by patient's septic state.  IV Cardizem 5 mg x 1 was given.  We shall continue to monitor for spontaneous resolve of the A. fib, otherwise, patient will be placed on rate control drip and anticoagulants.

## 2021-10-31 NOTE — Consult Note (Addendum)
Cardiology Consultation:   Patient ID: Roberto Sanchez. MRN: [id number]; DOB: 06-Nov-1946  Admit date: 10/29/2021 Date of Consult: 10/31/2021  PCP:  Cl[facility], [name]   [facility] Providers Cardiologist:  New to [facility] - Dr. Harl Bowie  Patient Profile:   Roberto Sanchez. is a 75 y.o. male with a past medical history of asthma, HTN, GERD and lung nodules who is being seen 10/31/2021 for the evaluation of atrial fibrillation with RVR at the request of Dr. Lonny Prude.  History of Present Illness:   Mr. Frieden presented to [facility] ED on 10/29/2021 for evaluation of chest pain, dyspnea and body aches. Was febrile to 100.5 F at home and was concerned he might have the flu.   Initial labs showed WBC 23.9, Hgb 15.0, platelets 397, Na+ 134, K+ 3.5 and creatinine 1.90 (baseline 0.7 - 0.8). BNP 142. Lactic Acid 5.5 with repeat of 5.0. Initial Hs Troponin 5 with repeat of 3. D-dimer 1.30. Negative for COVID and Influenza. CXR showing hazy left upper lobe opacity concerning for PNA. EKG showed NSR, HR 94 with RBBB and LAFB as noted on prior tracings. CTA showed no evidence of a PE but did confirm PNA noted on prior CXR.   He was admitted for further management of sepsis in the setting of PNA and started on Azithromycin and Rocephin. Was transferred to telemetry on 10/30/2021 but during the early morning hours of 10/31/2021, was noted to be in atrial fibrillation with RVR. Received IV Cardizem 5mg  followed by 10mg  at 0845 with a drip being initiated upon arrival to the ICU. By review of telemetry, he did convert back to normal sinus rhythm around 1100 and is in normal sinus rhythm currently with heart rate in the 80's and occasional PVC's.  In talking with the patient and his wife today, he reports he developed acutely worsening dyspnea approximately 2 days prior to admission. Also reports a dry cough and chest discomfort with frequent coughing.  No specific orthopnea, PND or pitting edema.  This  morning, he reports overall being unaware of his atrial fibrillation as he did not experience any palpitations or chest pain.  Reports he might of been short of breath at that time and his dyspnea has improved with improvement in his heart rate.     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[place])    Rupture of right triceps tendon 09/01/2015    Past Surgical History:  Procedure Laterality Date   BALLOON DILATION N/A 06/26/2021   Procedure: BALLOON DILATION;  Surgeon: Eloise Harman, DO;  [facility];  Service: Endoscopy;  Laterality: N/A;   COLONOSCOPY WITH PROPOFOL N/A 07/14/2020   diverticulosis, 5 polyps (the largest of which was 14 mm in the cecum) and recommended repeat in 1 year (tubular adenomas).    COLONOSCOPY WITH PROPOFOL N/A 06/26/2021   fair prep, non-bleeding internal hemorrhoids, sigmoid diverticulosis, two 4-7 mm polyps in cecum, one 2 mmp olyp in cecum. 3 year surveillance. Tubular adenoma.   ESOPHAGOGASTRODUODENOSCOPY (EGD) WITH PROPOFOL N/A 06/26/2021   moderate Shatzki ring, small hiatal hernia, gastritis s/p biopsy, normal duodenum. Negative H.pylori.   KNEE ARTHROPLASTY Left 10/2020   KNEE ARTHROSCOPY Right 1980s   NASAL SINUS SURGERY     POLYPECTOMY  07/14/2020   Procedure: POLYPECTOMY;  Surgeon: Eloise Harman, DO;  [facility]:  [facility];  Service: Endoscopy;;   POLYPECTOMY  06/26/2021   Procedure: POLYPECTOMY;  Surgeon: Eloise Harman, DO;  [facility]: [facility];  Service: Endoscopy;;   PROSTATE SURGERY     TONSILLECTOMY  age 40   TRICEPS TENDON REPAIR Right 09/01/2015   Procedure: RIGHT TRICEPS  REPAIR;  Surgeon: Marchia Bond, MD;  Location: [place];  Service: Orthopedics;  Laterality: Right;     Home Medications:  Prior to Admission medications   Medication Sig Start Date End Date Taking? Authorizing Provider   albuterol (VENTOLIN HFA) 108 (90 Base) MCG/ACT inhaler Inhale 2 puffs into the lungs every 6 (six) hours as needed for wheezing or shortness of breath. 03/16/08  Yes [provider]  ALPRAZolam (XANAX) 0.5 MG tablet Take 0.5 mg by mouth at bedtime. 10/22/21  Yes [provider]  aspirin EC 81 MG tablet Take 81 mg by mouth at bedtime.   Yes [provider]  Calcium Carb-Cholecalciferol (CALCIUM 600 + D PO) Take 1 tablet by mouth daily.   Yes [provider]  cetirizine (ZYRTEC) 10 MG tablet Take 10 mg by mouth daily. 10/22/21  Yes [provider]  chlorhexidine (PERIDEX) 0.12 % solution Use as directed 15 mLs in the mouth or throat 2 (two) times daily. 10/22/21  Yes [provider]  diclofenac Sodium (VOLTAREN) 1 % GEL Apply 4 g topically 4 (four) times daily. 10/22/21  Yes [provider]  DULoxetine (CYMBALTA) 20 MG capsule Take 20 mg by mouth at bedtime.   Yes [provider]  ezetimibe (ZETIA) 10 MG tablet Take 10 mg by mouth daily.   Yes [provider]  fluticasone (FLONASE) 50 MCG/ACT nasal spray Place 1 spray into both nostrils daily as needed for allergies or rhinitis.   Yes [provider]  Fluticasone-Salmeterol (ADVAIR) 250-50 MCG/DOSE AEPB Inhale 1 puff into the lungs every 12 (twelve) hours.   Yes [provider]  guaifenesin (HUMIBID E) 400 MG TABS tablet Take 400 mg by mouth daily as needed (congestion).   Yes [provider]  hydrochlorothiazide (MICROZIDE) 12.5 MG capsule Take 12.5 mg by mouth daily.   Yes [provider]  hydroxypropyl methylcellulose / hypromellose (ISOPTO TEARS / GONIOVISC) 2.5 % ophthalmic solution Place 1 drop into both eyes as needed for dry eyes.    Yes [provider]  losartan (COZAAR) 100 MG tablet Take 100 mg by mouth at bedtime.   Yes [provider]  montelukast (SINGULAIR) 10 MG tablet Take 10 mg by mouth daily. 03/18/15   Yes [provider]  Multiple Vitamin (MULTIVITAMIN WITH MINERALS) TABS tablet Take 1 tablet by mouth daily.   Yes [provider]  niacin 500 MG tablet Take 500 mg by mouth daily.    Yes [provider]  Omega-3 Fatty Acids (FISH OIL) 1000 MG CAPS Take 1,000 mg by mouth daily.    Yes [provider]  omeprazole (PRILOSEC) 20 MG capsule Take 1 capsule (20 mg total) by mouth 2 (two) times daily before a meal. Patient taking differently: Take 20 mg by mouth daily. 06/27/21 12/24/21 Yes Mahala Menghini, PA-C  ALPRAZolam Duanne Moron) 0.5 MG tablet Take 0.25-0.5 mg by mouth See admin instructions. Take 0.5 mg at bedtime, may take a 0.25 mg dose as needed for anxiety Patient not taking: Reported on 10/30/2021    [provider]    Inpatient Medications: Scheduled Meds:  ALPRAZolam  0.5 mg Oral QHS  apixaban  5 mg Oral BID   Chlorhexidine Gluconate Cloth  6 each Topical Daily   diltiazem  30 mg Oral Q8H   DULoxetine  20 mg Oral QHS   melatonin  6 mg Oral Once   mometasone-formoterol  2 puff Inhalation BID   montelukast  10 mg Oral Daily   pantoprazole  40 mg Oral Daily   polyethylene glycol  17 g Oral Daily   Continuous Infusions:  azithromycin Stopped (10/30/21 1149)   cefTRIAXone (ROCEPHIN)  IV 2 g (10/31/21 0617)   diltiazem (CARDIZEM) infusion 15 mg/hr (10/31/21 0916)   PRN Meds: acetaminophen **OR** acetaminophen, fluticasone, guaiFENesin, hydrALAZINE, ipratropium-albuterol, ondansetron **OR** ondansetron (ZOFRAN) IV, polyvinyl alcohol  Allergies:    Allergies  Allergen Reactions   Penicillins Other (See Comments)    Causes patient to "pass out"DID THE REACTION INVOLVE: Swelling of the face/tongue/throat, SOB, or low BP? No Sudden or severe rash/hives, skin peeling, or the inside of the mouth or nose? No Did it require medical treatment? No When did it last happen?       If all above answers are "NO", may proceed with cephalosporin use.     Shellfish Allergy Hives, Shortness Of Breath and Swelling    Facial Swelling  ears turn red   Atorvastatin Other (See Comments)    Muscle pain and muscle weakness   Simvastatin     Muscle pain and muscle weakness   Codeine Nausea And Vomiting    Social History:   Social History   Socioeconomic History   Marital status: Married    Spouse name: Not on file   Number of children: Not on file   Years of education: Not on file   Highest education level: Not on file  Occupational History   Not on file  Tobacco Use   Smoking status: Former    Packs/day: 3.00    Years: 20.00    Pack years: 60.00    Types: Cigarettes    Quit date: 08/30/1983    Years since quitting: 38.1   Smokeless tobacco: Former    Types: Chew    Quit date: 08/29/1985  Vaping Use   Vaping Use: Never used  Substance and Sexual Activity   Alcohol use: Yes    Comment: 3 to 5 beers weekly   Drug use: No   Sexual activity: Not on file  Other Topics Concern   Not on file  Social History Narrative   Not on file   Social Determinants of Health   Financial Resource Strain: Not on file  Food Insecurity: Not on file  Transportation Needs: Not on file  Physical Activity: Not on file  Stress: Not on file  Social Connections: Not on file  Intimate Partner Violence: Not on file    Family History:    Family History  Problem Relation Age of Onset   Asthma Maternal Uncle    Allergic rhinitis Brother    Allergic rhinitis Brother    Colon cancer Father 50   Angioedema Neg Hx    Eczema Neg Hx    Urticaria Neg Hx    Immunodeficiency Neg Hx      ROS:  Please see the history of present illness.   All other ROS reviewed and negative.     Physical Exam/Data:   Vitals:   10/31/21 0848 10/31/21 0900 10/31/21 1000 10/31/21 1101  BP: 112/75 128/66 114/72   Pulse: (!) 138 (!) 130 (!) 137 (!) 121  Resp: (!) 31 (!) 28 Marland Kitchen)  24 (!) 21  Temp: 98.5 F (36.9 C)   97.9 F (36.6 C)  TempSrc: Oral   Oral  SpO2: 98%  96% 96% 93%  Weight: 91.4 kg     Height: 5\' 10"  (1.778 m)       Intake/Output Summary (Last 24 hours) at 10/31/2021 1128 Last data filed at 10/31/2021 0600 Gross per 24 hour  Intake 166.88 ml  Output 1475 ml  Net -1308.12 ml   Last 3 Weights 10/31/2021 10/30/2021 10/29/2021  Weight (lbs) 201 lb 8 oz 206 lb 2.1 oz 208 lb 15.9 oz  Weight (kg) 91.4 kg 93.5 kg 94.8 kg     Body mass index is 28.91 kg/m.  General:  Well nourished, elderly male appearing in no acute distress. Frequent coughing.  HEENT: normal Neck: no JVD Vascular: No carotid bruits; Distal pulses 2+ bilaterally Cardiac:  normal S1, S2; RRR with occasional ectopic beats.  Lungs: Rhonchi throughout with mild expiratory wheeze along upper lung fields.  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  Relevant CV Studies:  Echocardiogram: Pending  Laboratory Data:  High Sensitivity Troponin:   Recent Labs  Lab 10/29/21 1212 10/29/21 1358  TROPONINIHS 5 3     Chemistry Recent Labs  Lab 10/29/21 1212 10/30/21 0409 10/31/21 0459  NA 134* 136 137  K 3.5 3.2* 3.3*  CL 99 103 104  CO2 21* 24 25  GLUCOSE 128* 110* 101*  BUN 28* 22 19  CREATININE 1.90* 0.90 0.73  CALCIUM 8.5* 8.0* 8.3*  MG  --  1.4* 2.0  GFRNONAA 36* >60 >60  ANIONGAP 14 9 8     Recent Labs  Lab 10/29/21 1212 10/30/21 0409  PROT 6.7 5.2*  ALBUMIN 3.2* 2.4*  AST 21 15  ALT 20 14  ALKPHOS 111 87  BILITOT 1.4* 0.6   Lipids No results for input(s): CHOL, TRIG, HDL, LABVLDL, LDLCALC, CHOLHDL in the last 168 hours.  Hematology Recent Labs  Lab 10/29/21 1212 10/30/21 0409 10/31/21 0459  WBC 23.9* 21.2* 22.8*  RBC 4.40 3.54*  3.72*  HGB 15.0 11.8* 12.2*  HCT 43.1 34.1* 36.4*  MCV 98.0 96.3 97.8  MCH 34.1* 33.3 32.8  MCHC 34.8 34.6 33.5  RDW 13.0 12.9 13.2  PLT 397 341 410*   Thyroid No results for input(s): TSH, FREET4 in the last 168 hours.  BNP Recent Labs  Lab 10/29/21 1212  BNP 142.0*    DDimer  Recent Labs  Lab 10/29/21 1212 10/30/21 0409  DDIMER 1.30* 1.61*     Radiology/Studies:  CT Angio Chest Pulmonary Embolism (PE) W or WO Contrast  Result Date: 10/30/2021 CLINICAL DATA:  Rule out pulmonary embolus. Left-sided chest pain with cough and shortness of breath. EXAM: CT ANGIOGRAPHY CHEST WITH CONTRAST TECHNIQUE: Multidetector CT imaging of the chest was performed using the standard protocol during bolus administration of intravenous contrast. Multiplanar CT image reconstructions and MIPs were obtained to evaluate the vascular anatomy. CONTRAST:  170mL OMNIPAQUE IOHEXOL 350 MG/ML SOLN COMPARISON:  CT chest 12/04/2016 FINDINGS: Cardiovascular: Satisfactory opacification of the pulmonary arteries to the segmental level. No evidence of pulmonary embolism. Normal heart size. No pericardial effusion. Mediastinum/Nodes: No enlarged mediastinal,  hilar, or axillary lymph nodes. Thyroid gland, trachea, and esophagus demonstrate no significant findings. Lungs/Pleura: Small left pleural effusion with overlying atelectasis. There is dense airspace consolidation with surrounding ground-glass attenuation within the posterior left upper lobe and left apex. Air bronchograms can be seen coursing through this area. Patchy areas of ground-glass attenuation are noted within the right upper lobe, image 54/6 and image 78/6. Upper Abdomen: No acute abnormality. Low-density structure and right lobe of liver is too small to characterize measuring 9 mm, image 91/4. This was present on 01/27/2012 and likely represents a benign abnormality such as a cyst. Musculoskeletal: Thoracic spondylosis identified. No acute or suspicious  osseous findings. Review of the MIP images confirms the above findings. IMPRESSION: 1. No evidence for acute pulmonary embolus. 2. Dense airspace consolidation with surrounding ground-glass attenuation comprising greater than 50% of the posterior right upper lobe and right apex. Findings are favored to represent pneumonia. Follow-up imaging is advised to ensure resolution and exclude underlying malignancy. 3. Small left pleural effusion with overlying atelectasis. Electronically Signed   By: Kerby Moors M.D.   On: 10/30/2021 12:24   DG Chest Port 1 View  Result Date: 10/29/2021 CLINICAL DATA:  Chest pain, shortness of breath EXAM: PORTABLE CHEST 1 VIEW COMPARISON:  09/03/2021 FINDINGS: The heart size and mediastinal contours are within normal limits. Hazy left upper lobe airspace opacity. Streaky left basilar opacity. No large pleural effusion. No pneumothorax. IMPRESSION: Hazy left upper lobe airspace opacity suspicious for pneumonia. Streaky left basilar opacity likely atelectasis. Electronically Signed   By: Davina Poke D.O.   On: 10/29/2021 12:38     Assessment and Plan:   1. New-Onset Atrial Fibrillation  - New diagnosis for the patient this admission and likely triggered by his acute illness as he is currently admitted for sepsis in the setting of PNA. He was started on IV Cardizem this morning and has converted back to normal sinus rhythm with heart rate currently in the 80's. Currently on 15 mg/hr and will gradually wean as long as his heart rate remains stable. Would anticipate switching to short-acting Cardizem tablets 30 mg Q8H and can convert to CD dosing if vitals remain stable.  Prefer this over beta-blocker therapy given his asthma. If EF is reduced by echocardiogram, would need to switch to a cardioselective beta-blocker. - He has already been started on Eliquis 5 mg twice daily by the admitting team.  Given no prior history of atrial fibrillation, can possibly consider a short  course of anticoagulation unless he continues to have recurrent events.   2. HTN - His BP is currently well controlled but he is on IV Cardizem 15 mg/hr. Follow BP with medication adjustments. PTA Losartan 100mg  daily and HCTZ 12.5mg  daily currently held.   3. Asthma - Followed by [facility] as an outpatient. He does report a history of Agent Orange exposure.   4. Sepsis in the setting of PNA - Remains on Azithromycin and Ceftriaxone. Further management per the admitting team.   5. Hypokalemia - K+ was at 3.3 this AM with Mg at 2.0. Supplementation has already been ordered by the admitting team. Keep K+ ~ 4.0.   Risk Assessment/Risk Scores:    CHA2DS2-VASc Score = 3   This indicates a 3.2% annual risk of stroke. The patient's score is based upon: CHF History: 0 HTN History: 1 Diabetes History: 0 Stroke History: 0 Vascular Disease History: 0 Age Score: 2 Gender Score: 0    For questions or updates, please  contact [facility] Please consult [email or web address] for contact info under    Signed, Erma Heritage, PA-C  10/31/2021 11:28 AM   Patient seen and discussed with PA Ahmed Prima, I agree with her documentation. 75 yo male history of hyperlipidemia, HTN, asthma, admitted with chest pain and SOB. Diagnosed with pneumonia sepsis, in this setting developed afib with RVR this admission new diagnosis for the patient. Patient himself denies any signifcant palpitations.        Lactic acid 5.5 K 3.5 Cr 1.9 BUN 28 WBC 23.9 Hgb 15 Plt 397 BNP 142 Ddimer 1.30 Procalc 2.57  Trop 5-->3 COVID neg Flu neg CXR hazy LEL opacity CT PE no PE, RUL infiltrate EKG SR, RBBB/LAFB/ Subsequent EKG afib with RVR       Patient presents with pneumonia/sepsis, in this setting new diagnosis of afib with RVR. STarted on dilt gtt for rate control and has converted back to NSR this afternoon. Started on eliquis for stroke prevention. Patient asymptomatic with his afib so unclear chronicity  and if isolated in setting of acute systemic illness. Would continue anticoagulation and plan for outpatient monitor arranged at follow up, if no recurrent afib could be reasonable to come off anticoag at that time. Start diltaizem 30mg  every 6 hours with hold parameters, wean dilt gtt.   Carlyle Dolly MD

## 2021-10-31 NOTE — Progress Notes (Signed)
PT Cancellation Note  Patient Details Name: Roberto Sanchez. MRN: [id number] DOB: 06/13/1946   Cancelled Treatment:    Reason Eval/Treat Not Completed: Medical issues which prohibited therapy.  Patient transferred to a higher level of care and will need new PT consult to start therapy when patient is medically stable.  Thank you.   10:22 AM, 10/31/21 Lonell Grandchild, MPT Physical Therapist with [facility] [phone] office [phone] mobile phone

## 2021-10-31 NOTE — Assessment & Plan Note (Signed)
Secondary to sepsis and acute infection. Baseline creatinine of 0.7-0.9 with creatinine of 1.9 on admission. Resolved with IV fluids.

## 2021-11-01 ENCOUNTER — Other Ambulatory Visit (HOSPITAL_COMMUNITY): Payer: Self-pay

## 2021-11-01 DIAGNOSIS — J9601 Acute respiratory failure with hypoxia: Secondary | ICD-10-CM

## 2021-11-01 LAB — CBC
HCT: 34.7 % — ABNORMAL LOW (ref 39.0–52.0)
Hemoglobin: 11.9 g/dL — ABNORMAL LOW (ref 13.0–17.0)
MCH: 33.4 pg (ref 26.0–34.0)
MCHC: 34.3 g/dL (ref 30.0–36.0)
MCV: 97.5 fL (ref 80.0–100.0)
Platelets: 400 10*3/uL (ref 150–400)
RBC: 3.56 MIL/uL — ABNORMAL LOW (ref 4.22–5.81)
RDW: 13.3 % (ref 11.5–15.5)
WBC: 13.8 10*3/uL — ABNORMAL HIGH (ref 4.0–10.5)
nRBC: 0 % (ref 0.0–0.2)

## 2021-11-01 LAB — LEGIONELLA PNEUMOPHILA SEROGP 1 UR AG: L. pneumophila Serogp 1 Ur Ag: NEGATIVE

## 2021-11-01 MED ORDER — DILTIAZEM HCL ER COATED BEADS 120 MG PO CP24
120.0000 mg | ORAL_CAPSULE | Freq: Every day | ORAL | Status: DC
  Administered 2021-11-01 – 2021-11-03 (×3): 120 mg via ORAL
  Filled 2021-11-01 (×3): qty 1

## 2021-11-01 MED ORDER — GUAIFENESIN ER 600 MG PO TB12
1200.0000 mg | ORAL_TABLET | Freq: Two times a day (BID) | ORAL | Status: DC
  Administered 2021-11-01 – 2021-11-03 (×5): 1200 mg via ORAL
  Filled 2021-11-01 (×5): qty 2

## 2021-11-01 NOTE — Evaluation (Signed)
Physical Therapy Evaluation Patient Details Name: Roberto Sanchez. MRN: [id number] DOB: 1946/10/12 Today's Date: 11/01/2021  History of Present Illness  [name]. is a 75 y.o. male with medical history significant for dyslipidemia, hypertension, GERD, asthma, and anxiety who was brought in by his wife to the ED for evaluation of left-sided chest pain as well as some shortness of breath that began approximately 2-3 days ago.  This has progressed with worsening pain as well as cough that has been nonproductive.  He is also complained of fever at home with temperature up to 100.5 F.  No chills noted.  He has been taking his other medications regularly and denies any sick contacts.  He apparently tested at home for COVID and was noted to be negative.  He tried taking ibuprofen with no improvement.   Clinical Impression  Patient demonstrates labored movement for sitting up at bedside with [facility] flat, unable to stand without AD due to BLE weakness and required use of RW for safety.  Patient demonstrates slow labored cadence and able to ambulate just outside of room before having to turn due to fatigue and tolerated sitting up in chair after therapy with his spouse present in room.  Patient will benefit from continued skilled physical therapy in hospital and recommended venue below to increase strength, balance, endurance for safe ADLs and gait.        Recommendations for follow up therapy are one component of a multi-disciplinary discharge planning process, led by the attending physician.  Recommendations may be updated based on patient status, additional functional criteria and insurance authorization.  Follow Up Recommendations Home health PT    Assistance Recommended at Discharge Intermittent Supervision/Assistance  Functional Status Assessment Patient has had a recent decline in their functional status and demonstrates the ability to make significant improvements in function in a  reasonable and predictable amount of time.  Equipment Recommendations  None recommended by PT    Recommendations for Other Services       Precautions / Restrictions Precautions Precautions: Fall Restrictions Weight Bearing Restrictions: No      Mobility  Bed Mobility Overal bed mobility: Needs Assistance Bed Mobility: Supine to Sit     Supine to sit: Supervision     General bed mobility comments: increased time, labored movement    Transfers Overall transfer level: Needs assistance Equipment used: Rolling Longnecker (2 wheels) Transfers: Sit to/from Stand;Bed to chair/wheelchair/BSC Sit to Stand: Min assist   Step pivot transfers: Min guard;Min assist       General transfer comment: unable to stand without use of an AD due to BLE weakness, required use of RW    Ambulation/Gait Ambulation/Gait assistance: Min assist Gait Distance (Feet): 24 Feet Assistive device: Rolling Bokhari (2 wheels) Gait Pattern/deviations: Decreased step length - left;Decreased stance time - right;Decreased stride length Gait velocity: decreased     General Gait Details: slow labored cadence with decreased step/stride length, limited mostly due to fatigue, no loss of balance using RW  Stairs            Wheelchair Mobility    Modified Rankin (Stroke Patients Only)       Balance Overall balance assessment: Needs assistance Sitting-balance support: Feet supported;No upper extremity supported Sitting balance-Leahy Scale: Good Sitting balance - Comments: seated at EOB   Standing balance support: Reliant on assistive device for balance;During functional activity;Bilateral upper extremity supported Standing balance-Leahy Scale: Fair Standing balance comment: using RW, poor without AD  Pertinent Vitals/Pain Pain Assessment: No/denies pain    Home Living Family/patient expects to be discharged to:: Private residence Living Arrangements:  Spouse/significant other Available Help at Discharge: Family;Available 24 hours/day Type of Home: House Home Access: Stairs to enter Entrance Stairs-Rails: None Entrance Stairs-Number of Steps: 1   Home Layout: One level Home Equipment: Conservation officer, nature (2 wheels);BSC/3in1;Shower seat      Prior Function Prior Level of Function : Independent/Modified Independent             Mobility Comments: Hydrographic surveyor, drives ADLs Comments: Independent     Hand Dominance        Extremity/Trunk Assessment   Upper Extremity Assessment Upper Extremity Assessment: Generalized weakness    Lower Extremity Assessment Lower Extremity Assessment: Generalized weakness    Cervical / Trunk Assessment Cervical / Trunk Assessment: Normal  Communication   Communication: No difficulties  Cognition Arousal/Alertness: Awake/alert Behavior During Therapy: WFL for tasks assessed/performed Overall Cognitive Status: Within Functional Limits for tasks assessed                                          General Comments      Exercises     Assessment/Plan    PT Assessment Patient needs continued PT services  PT Problem List Decreased strength;Decreased activity tolerance;Decreased balance;Decreased mobility       PT Treatment Interventions DME instruction;Gait training;Stair training;Functional mobility training;Therapeutic exercise;Therapeutic activities;Patient/family education;Balance training    PT Goals (Current goals can be found in the Care Plan section)  Acute Rehab PT Goals Patient Stated Goal: return home with family to assist PT Goal Formulation: With patient/family Time For Goal Achievement: 11/05/21 Potential to Achieve Goals: Good    Frequency Min 3X/week   Barriers to discharge        Co-evaluation               AM-PAC PT "6 Clicks" Mobility  Outcome Measure Help needed turning from your back to your side while in a flat bed without  using bedrails?: None Help needed moving from lying on your back to sitting on the side of a flat bed without using bedrails?: A Little Help needed moving to and from a bed to a chair (including a wheelchair)?: A Little Help needed standing up from a chair using your arms (e.g., wheelchair or bedside chair)?: A Little Help needed to walk in hospital room?: A Little Help needed climbing 3-5 steps with a railing? : A Lot 6 Click Score: 18    End of Session   Activity Tolerance: Patient tolerated treatment well;Patient limited by fatigue Patient left: in chair;with call bell/phone within reach;with family/visitor present Nurse Communication: Mobility status PT Visit Diagnosis: Unsteadiness on feet (R26.81);Other abnormalities of gait and mobility (R26.89);Muscle weakness (generalized) (M62.81)    Time: 8832-5498 PT Time Calculation (min) (ACUTE ONLY): 17 min   Charges:   PT Evaluation $PT Eval Low Complexity: 1 Low PT Treatments $Therapeutic Activity: 8-22 mins        3:50 PM, 11/01/21 Lonell Grandchild, MPT Physical Therapist with [facility] [phone] office [phone] mobile phone

## 2021-11-01 NOTE — TOC Benefit Eligibility Note (Signed)
Patient Teacher, English as a foreign language completed.    The patient is currently admitted and upon discharge could be taking Eliquis 5 mg.  The current 30 day co-pay is, $459.25 due to a $412.25 deductible remaining.   The patient is insured through [place] [place] Patient Advocate Specialist [place] Patient Advocate Team Direct Number: [phone]  Fax: [phone]

## 2021-11-01 NOTE — Plan of Care (Signed)
  Problem: Acute Rehab PT Goals(only PT should resolve) Goal: Pt Will Go Supine/Side To Sit Outcome: Progressing Flowsheets (Taken 11/01/2021 1551) Pt will go Supine/Side to Sit: with modified independence Goal: Patient Will Transfer Sit To/From Stand Outcome: Progressing Flowsheets (Taken 11/01/2021 1551) Patient will transfer sit to/from stand:  with modified independence  with supervision Goal: Pt Will Transfer Bed To Chair/Chair To Bed Outcome: Progressing Flowsheets (Taken 11/01/2021 1551) Pt will Transfer Bed to Chair/Chair to Bed:  with modified independence  with supervision Goal: Pt Will Ambulate Outcome: Progressing Flowsheets (Taken 11/01/2021 1551) Pt will Ambulate:  75 feet  with supervision  with min guard assist  with rolling [place]   3:51 PM, 11/01/21 Lonell Grandchild, MPT Physical Therapist with [facility] [phone] office [phone] mobile phone

## 2021-11-01 NOTE — Progress Notes (Addendum)
Progress Note  Patient Name: Roberto Sanchez. Date of Encounter: 11/01/2021  [facility] Cardiologist: Carlyle Dolly, MD   Subjective   Breathing improved. No chest pain or palpitations. Still feels very weak.   Inpatient Medications    Scheduled Meds:  ALPRAZolam  0.5 mg Oral QHS   apixaban  5 mg Oral BID   Chlorhexidine Gluconate Cloth  6 each Topical Daily   diltiazem  120 mg Oral Daily   DULoxetine  20 mg Oral QHS   mometasone-formoterol  2 puff Inhalation BID   montelukast  10 mg Oral Daily   pantoprazole  40 mg Oral Daily   polyethylene glycol  17 g Oral Daily   Continuous Infusions:  azithromycin 500 mg (10/31/21 1214)   cefTRIAXone (ROCEPHIN)  IV 2 g (11/01/21 0550)   PRN Meds: acetaminophen **OR** acetaminophen, fluticasone, guaiFENesin, hydrALAZINE, ipratropium-albuterol, ondansetron **OR** ondansetron (ZOFRAN) IV, polyvinyl alcohol   Vital Signs    Vitals:   11/01/21 0000 11/01/21 0400 11/01/21 0631 11/01/21 0729  BP:   135/70   Pulse:   80 84  Resp:   18 (!) 23  Temp: 98.5 F (36.9 C) 98 F (36.7 C)  98.6 F (37 C)  TempSrc: Oral Oral  Oral  SpO2:   96% 97%  Weight:   93.7 kg   Height:        Intake/Output Summary (Last 24 hours) at 11/01/2021 0934 Last data filed at 11/01/2021 0838 Gross per 24 hour  Intake --  Output 1250 ml  Net -1250 ml   Last 3 Weights 11/01/2021 10/31/2021 10/30/2021  Weight (lbs) 206 lb 9.1 oz 201 lb 8 oz 206 lb 2.1 oz  Weight (kg) 93.7 kg 91.4 kg 93.5 kg      Telemetry    NSR, HR in 70's to 80's. Occasional PVC's. Brief episodes of narrow-complex tachycardia < 3 seconds. - Personally Reviewed  ECG    No new tracings.   Physical Exam   GEN: Pleasant elderly male appearing in no acute distress.   Neck: No JVD Cardiac: RRR with occasional ectopic beats, no murmurs, rubs, or gallops.  Respiratory: Scattered rhonchi. No wheezing or rales GI: Soft, nontender, non-distended  MS: No pitting edema; No  deformity. Neuro:  Nonfocal  Psych: Normal affect   Labs    High Sensitivity Troponin:   Recent Labs  Lab 10/29/21 1212 10/29/21 1358  TROPONINIHS 5 3     Chemistry Recent Labs  Lab 10/29/21 1212 10/30/21 0409 10/31/21 0459  NA 134* 136 137  K 3.5 3.2* 3.3*  CL 99 103 104  CO2 21* 24 25  GLUCOSE 128* 110* 101*  BUN 28* 22 19  CREATININE 1.90* 0.90 0.73  CALCIUM 8.5* 8.0* 8.3*  MG  --  1.4* 2.0  PROT 6.7 5.2*  --   ALBUMIN 3.2* 2.4*  --   AST 21 15  --   ALT 20 14  --   ALKPHOS 111 87  --   BILITOT 1.4* 0.6  --   GFRNONAA 36* >60 >60  ANIONGAP 14 9 8     Lipids No results for input(s): CHOL, TRIG, HDL, LABVLDL, LDLCALC, CHOLHDL in the last 168 hours.  Hematology Recent Labs  Lab 10/30/21 0409 10/31/21 0459 11/01/21 0405  WBC 21.2* 22.8* 13.8*  RBC 3.54* 3.72* 3.56*  HGB 11.8* 12.2* 11.9*  HCT 34.1* 36.4* 34.7*  MCV 96.3 97.8 97.5  MCH 33.3 32.8 33.4  MCHC 34.6 33.5 34.3  RDW 12.9 13.2  13.3  PLT 341 410* 400   Thyroid No results for input(s): TSH, FREET4 in the last 168 hours.  BNP Recent Labs  Lab 10/29/21 1212  BNP 142.0*    DDimer  Recent Labs  Lab 10/29/21 1212 10/30/21 0409  DDIMER 1.30* 1.61*     Radiology    CT Angio Chest Pulmonary Embolism (PE) W or WO Contrast  Result Date: 10/30/2021 CLINICAL DATA:  Rule out pulmonary embolus. Left-sided chest pain with cough and shortness of breath. EXAM: CT ANGIOGRAPHY CHEST WITH CONTRAST TECHNIQUE: Multidetector CT imaging of the chest was performed using the standard protocol during bolus administration of intravenous contrast. Multiplanar CT image reconstructions and MIPs were obtained to evaluate the vascular anatomy. CONTRAST:  182mL OMNIPAQUE IOHEXOL 350 MG/ML SOLN COMPARISON:  CT chest 12/04/2016 FINDINGS: Cardiovascular: Satisfactory opacification of the pulmonary arteries to the segmental level. No evidence of pulmonary embolism. Normal heart size. No pericardial effusion. Mediastinum/Nodes:  No enlarged mediastinal, hilar, or axillary lymph nodes. Thyroid gland, trachea, and esophagus demonstrate no significant findings. Lungs/Pleura: Small left pleural effusion with overlying atelectasis. There is dense airspace consolidation with surrounding ground-glass attenuation within the posterior left upper lobe and left apex. Air bronchograms can be seen coursing through this area. Patchy areas of ground-glass attenuation are noted within the right upper lobe, image 54/6 and image 78/6. Upper Abdomen: No acute abnormality. Low-density structure and right lobe of liver is too small to characterize measuring 9 mm, image 91/4. This was present on 01/27/2012 and likely represents a benign abnormality such as a cyst. Musculoskeletal: Thoracic spondylosis identified. No acute or suspicious osseous findings. Review of the MIP images confirms the above findings. IMPRESSION: 1. No evidence for acute pulmonary embolus. 2. Dense airspace consolidation with surrounding ground-glass attenuation comprising greater than 50% of the posterior right upper lobe and right apex. Findings are favored to represent pneumonia. Follow-up imaging is advised to ensure resolution and exclude underlying malignancy. 3. Small left pleural effusion with overlying atelectasis. Electronically Signed   By: Kerby Moors M.D.   On: 10/30/2021 12:24   Cardiac Studies   Echocardiogram: 10/31/2021 IMPRESSIONS     1. Left ventricular ejection fraction, by estimation, is 60 to 65%. The  left ventricle has normal function. The left ventricle has no regional  wall motion abnormalities. There is mild left ventricular hypertrophy.  Left ventricular diastolic parameters  were normal.   2. Right ventricular systolic function is normal. The right ventricular  size is normal. There is normal pulmonary artery systolic pressure.   3. The mitral valve is normal in structure. Trivial mitral valve  regurgitation. No evidence of mitral stenosis.    4. The aortic valve was not well visualized. There is mild calcification  of the aortic valve. There is mild thickening of the aortic valve. Aortic  valve regurgitation is not visualized. No aortic stenosis is present.   5. The inferior vena cava is normal in size with greater than 50%  respiratory variability, suggesting right atrial pressure of 3 mmHg.   Patient Profile     75 y.o. male w/PMH of asthma, HTN, GERD and lung nodule who is currently admitted for sepsis in the setting of PNA. Cardiology consulted for new-onset atrial fibrillation on 10/31/2021.  Assessment & Plan   1. New-Onset Atrial Fibrillation  - Found to be in atrial fibrillation with RVR on 10/31/2021 and converted back to NSR within 6 hours while on IV Cardizem. Mostly in NSR overnight and this AM with brief episodes  of narrow-complex tachycardia lasting less than 3 seconds and possibly SVT. Echo shows a preserved EF with no regional WMA and no significant valve abnormalities.LA normal in size.  - He was weaned off IV Cardizem yesterday and is currently on short-acting Cardizem. Given stable BP, will transition to Cardizem CD 120mg  daily. - Given his CHA2DS2-VASc Score of 3, he has been started on Eliquis 5mg  BID. Given he was mostly asymptomatic with his arrhythmia, Dr. Harl Bowie did recommend arranging for an outpatient monitor at his follow-up appointment to assess for any recurrence before stopping anticoagulation.   2. HTN - BP well-controlled this AM with SBP in the 130's. Off IV Cardizem and will transition short-acting Cardizem to CD dosing as outlined above. PTA Losartan and HCTZ currently held. May need to resume Losartan at a lower dose pending BP trend this admission.    3. 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    4. Sepsis in the setting of PNA - Still febrile at times with temp at 101.3 yesterday afternoon but WBC trending down, at 13.8 this AM.  Management per the admitting team. Remains on Azithromycin and Rocephin.    5. Hypokalemia - K+ was at 3.3 on 10/31/2021 with replacement ordered. Will order repeat BMET with AM labs as not obtained today. Keep K+ ~ 4.0.    Will arrange for follow-up at our [facility].   For questions or updates, please contact [place] Please consult [email or web address] for contact info under      Signed, Erma Heritage, PA-C  11/01/2021, 9:34 AM    Personally seen and examined. Agree with APP above with the following comments: Briefly 75 yo M with new PAF in the setting of sepsis and PNA and hx of HTN (CHADSVASC 3) Patient notes no symptoms, he is s/p spontaneous conversion 10/31/21 at 11 AM Exam notable for regular rhythm, occasional rhonchi Labs notable for WBC trending down. K was low 10/31/21 Personally reviewed relevant tests; Tele shows occasional PVC but  Would recommend  - DOAC and eliquis as above - we have reached out to the [place] in [place] per his request; he is 100% service connect and would like to get his care there long term - we have arranged short term follow up in [facility] will sign off.   Medication Recommendations:  DOAC and Diltiazem Other recommendations (labs, testing, etc):  NA Follow up as an outpatient:  arranging [facility] f/u; [facility] has [facility]  Rudean Haskell, MD [place], [address], [place] [phone]  9:57 AM

## 2021-11-01 NOTE — Progress Notes (Signed)
PROGRESS NOTE    Roberto Sanchez.  GNF:[id number] DOB: [birth year] DOA: 10/29/2021 PCP: [name]   Brief Narrative: Roberto Sanchez. Is a 75 y.o. male with a history of hyperlipidemia, GERD, asthma and anxiety. Patient presented secondary to left sided chest pain and dyspnea and was found to have evidence of sepsis from community acquired pneumonia. Patient started on empiric antibiotics and is improving. While admitted, he developed atrial fibrillation with RVR and was started on a Cardizem IV infusion. Cardiology was consulted for persistent tachycardia while on max Cardizem, but patient converted back to sinus rhythm shortly after. Eliquis started for stroke prevention and patient transitioned to PO Cardizem.   Assessment & Plan:   * Severe sepsis ([place]) Likely secondary to pneumonia.  Blood cultures with no growth to date. Urine culture without growth. Procalcitonin trending down. Leukocytosis trending down with treatment.  Respiratory failure with hypoxia ([place]) Secondary to pneumonia. Patient with persistent dyspnea with mobility, even within his bed. -Wean oxygen as able -PT eval  Community acquired pneumonia Multifocal pneumonia. Empirically treated with Ceftriaxone and azithromycin with progressive improvement. -Continue Ceftriaxone/Azithromycin -Flutter valve -Mucinex  Atrial fibrillation with RVR (HCC) New onset and in setting of acute illness. HR resting between 120-140 bpm. Blood pressure supportive. Given Cardizem 5 mg and metoprolol 5 mg IV without improvement of HR. Cardizem IV drip started and maxed out without improvement in HR, so cardiology was consulted. Shortly after consultation, patient converted back into sinus rhythm and is now transitioned to Cardizem PO. Transthoracic Echocardiogram unremarkable. -Continue Cardizem, Eliquis  -Cardiology recommendations: pending today  Hyperlipidemia Patient is on omega 3 fatty acids as an  outpatient  Elevated d-dimer CTA chest without evidence of acute PE. No clinical indication of LE DVT.  GERD (gastroesophageal reflux disease) Patient is on omeprazole as an outpatient -Continue Protonix  Anxiety -Continue Xanax  Asthma, chronic -Continue Dulera  Essential hypertension Patient is on hydrochlorothiazide, losartan as an outpatient. Antihypertensives held on admission secondary to sepsis. Started on Cardizem for atrial fibrillation -Continue Cardizem per cardiology  AKI (acute kidney injury) (HCC)-resolved as of 10/31/2021 Secondary to sepsis and acute infection. Baseline creatinine of 0.7-0.9 with creatinine of 1.9 on admission. Resolved with IV fluids.    DVT prophylaxis: Eliquis Code Status:   Code Status: Full Code Family Communication: None at bedside. Patient declined for me to call anyone. Disposition Plan: Transfer to telemetry 12/8. Discharge home vs SNF likely in 1-3 days pending continued control of afib, improvement of pneumonia/hypoxia, PT/OT recommendations   Consultants:  Cardiology  Procedures:  TRANSTHORACIC ECHOCARDIOGRAM (10/31/2021) IMPRESSIONS     1. Left ventricular ejection fraction, by estimation, is 60 to 65%. The  left ventricle has normal function. The left ventricle has no regional  wall motion abnormalities. There is mild left ventricular hypertrophy.  Left ventricular diastolic parameters  were normal.   2. Right ventricular systolic function is normal. The right ventricular  size is normal. There is normal pulmonary artery systolic pressure.   3. The mitral valve is normal in structure. Trivial mitral valve  regurgitation. No evidence of mitral stenosis.   4. The aortic valve was not well visualized. There is mild calcification  of the aortic valve. There is mild thickening of the aortic valve. Aortic  valve regurgitation is not visualized. No aortic stenosis is present.   5. The inferior vena cava is normal in size with  greater than 50%  respiratory variability, suggesting right atrial pressure of 3 mmHg.  Antimicrobials: Ceftriaxone Azithromycin    Subjective: Feels okay. Still having significant dyspnea even when moving within his bed. No associated chest pain. He has some cough but not much production.   Objective: Vitals:   11/01/21 0000 11/01/21 0400 11/01/21 0631 11/01/21 0729  BP:   135/70   Pulse:   80 84  Resp:   18 (!) 23  Temp: 98.5 F (36.9 C) 98 F (36.7 C)  98.6 F (37 C)  TempSrc: Oral Oral  Oral  SpO2:   96% 97%  Weight:   93.7 kg   Height:        Intake/Output Summary (Last 24 hours) at 11/01/2021 0809 Last data filed at 10/31/2021 1837 Gross per 24 hour  Intake --  Output 600 ml  Net -600 ml    Filed Weights   10/30/21 0548 10/31/21 0848 11/01/21 0631  Weight: 93.5 kg 91.4 kg 93.7 kg    Examination:  General exam: Appears calm and comfortable Respiratory system: Rhonchi and slightly diminished. Respiratory effort normal. Cardiovascular system: S1 & S2 heard, RRR. No murmurs, rubs, gallops or clicks. Gastrointestinal system: Abdomen is nondistended, soft and nontender. No organomegaly or masses felt. Normal bowel sounds heard. Central nervous system: Alert and oriented. No focal neurological deficits. Musculoskeletal: No edema. No calf tenderness Skin: No cyanosis. No rashes Psychiatry: Judgement and insight appear normal. Mood & affect appropriate.     Data Reviewed: I have personally reviewed following labs and imaging studies  CBC Lab Results  Component Value Date   WBC 13.8 (H) 11/01/2021   RBC 3.56 (L) 11/01/2021   HGB 11.9 (L) 11/01/2021   HCT 34.7 (L) 11/01/2021   MCV 97.5 11/01/2021   MCH 33.4 11/01/2021   PLT 400 11/01/2021   MCHC 34.3 11/01/2021   RDW 13.3 11/01/2021   LYMPHSABS 1.4 10/29/2021   MONOABS 0.0 (L) 10/29/2021   EOSABS 0.0 10/29/2021   BASOSABS 0.0 62/13/0865     Last metabolic panel Lab Results  Component Value Date    NA 137 10/31/2021   K 3.3 (L) 10/31/2021   CL 104 10/31/2021   CO2 25 10/31/2021   BUN 19 10/31/2021   CREATININE 0.73 10/31/2021   GLUCOSE 101 (H) 10/31/2021   GFRNONAA >60 10/31/2021   GFRAA >60 11/13/2018   CALCIUM 8.3 (L) 10/31/2021   PROT 5.2 (L) 10/30/2021   ALBUMIN 2.4 (L) 10/30/2021   BILITOT 0.6 10/30/2021   ALKPHOS 87 10/30/2021   AST 15 10/30/2021   ALT 14 10/30/2021   ANIONGAP 8 10/31/2021    CBG (last 3)  No results for input(s): GLUCAP in the last 72 hours.   GFR: Estimated Creatinine Clearance: 91.7 mL/min (by C-G formula based on SCr of 0.73 mg/dL).  Coagulation Profile: Recent Labs  Lab 10/29/21 1212  INR 1.1     Recent Results (from the past 240 hour(s))  Blood Culture (routine x 2)     Status: None (Preliminary result)   Collection Time: 10/29/21 12:12 PM   Specimen: Left Antecubital; Blood  Result Value Ref Range Status   Specimen Description LEFT ANTECUBITAL  Final   Special Requests   Final    BOTTLES DRAWN AEROBIC AND ANAEROBIC Blood Culture adequate volume   Culture   Final    NO GROWTH 2 DAYS Performed at [facility], [address], [place], [place] [address]    Report Status PENDING  Incomplete  Blood Culture (routine x 2)     Status: None (Preliminary result)  Collection Time: 10/29/21 12:12 PM   Specimen: Right Antecubital; Blood  Result Value Ref Range Status   Specimen Description RIGHT ANTECUBITAL  Final   Special Requests   Final    BOTTLES DRAWN AEROBIC AND ANAEROBIC Blood Culture adequate volume   Culture   Final    NO GROWTH 2 DAYS Performed at [facility], [address] [place], [place] [address]    Report Status PENDING  Incomplete  Resp Panel by RT-PCR (Flu A&B, Covid) Nasopharyngeal Swab     Status: None   Collection Time: 10/29/21 12:12 PM   Specimen: Nasopharyngeal Swab; Nasopharyngeal(NP) swabs in vial transport medium  Result Value Ref Range Status   SARS Coronavirus 2 by RT PCR NEGATIVE NEGATIVE Final     Comment: (NOTE) SARS-CoV-2 target nucleic acids are NOT DETECTED.  The SARS-CoV-2 RNA is generally detectable in upper respiratory specimens during the acute phase of infection. The lowest concentration of SARS-CoV-2 viral copies this assay can detect is 138 copies/mL. A negative result does not preclude SARS-Cov-2 infection and should not be used as the sole basis for treatment or other patient management decisions. A negative result may occur with  improper specimen collection/handling, submission of specimen other than nasopharyngeal swab, presence of viral mutation(s) within the areas targeted by this assay, and inadequate number of viral copies(<138 copies/mL). A negative result must be combined with clinical observations, patient history, and epidemiological information. The expected result is Negative.  Fact Sheet for Patients:  EntrepreneurPulse.com.au  Fact Sheet for Healthcare Providers:  IncredibleEmployment.be  This test is no t yet approved or cleared by the [place] FDA and  has been authorized for detection and/or diagnosis of SARS-CoV-2 by FDA under an Emergency Use Authorization (EUA). This EUA will remain  in effect (meaning this test can be used) for the duration of the COVID-19 declaration under Section 564(b)(1) of the Act, 21 U.S.C.section 360bbb-3(b)(1), unless the authorization is terminated  or revoked sooner.       Influenza A by PCR NEGATIVE NEGATIVE Final   Influenza B by PCR NEGATIVE NEGATIVE Final    Comment: (NOTE) The Xpert Xpress SARS-CoV-2/FLU/RSV plus assay is intended as an aid in the diagnosis of influenza from Nasopharyngeal swab specimens and should not be used as a sole basis for treatment. Nasal washings and aspirates are unacceptable for Xpert Xpress SARS-CoV-2/FLU/RSV testing.  Fact Sheet for Patients: EntrepreneurPulse.com.au  Fact Sheet for Healthcare  Providers: IncredibleEmployment.be  This test is not yet approved or cleared by the [place] FDA and has been authorized for detection and/or diagnosis of SARS-CoV-2 by FDA under an Emergency Use Authorization (EUA). This EUA will remain in effect (meaning this test can be used) for the duration of the COVID-19 declaration under Section 564(b)(1) of the Act, 21 U.S.C. section 360bbb-3(b)(1), unless the authorization is terminated or revoked.  Performed at [facility], [address], [place], [place] [address]   Urine Culture     Status: None   Collection Time: 10/29/21  2:54 PM   Specimen: Urine, Catheterized  Result Value Ref Range Status   Specimen Description   Final    URINE, CATHETERIZED Performed at [facility], [address] [place], [place] [address]    Special Requests   Final    NONE Performed at [facility], [address] [place], [place] [address]    Culture   Final    NO GROWTH Performed at [facility], [place] [address] [place], [place] [address]  Report Status 10/30/2021 FINAL  Final  MRSA Next Gen by PCR, Nasal     Status: None   Collection Time: 10/29/21  7:44 PM   Specimen: Nasal Mucosa; Nasal Swab  Result Value Ref Range Status   MRSA by PCR Next Gen NOT DETECTED NOT DETECTED Final    Comment: (NOTE) The GeneXpert MRSA Assay (FDA approved for NASAL specimens only), is one component of a comprehensive MRSA colonization surveillance program. It is not intended to diagnose MRSA infection nor to guide or monitor treatment for MRSA infections. Test performance is not FDA approved in patients less than 25 years old. Performed at [facility], [address] [place], [place] [address]          Radiology Studies: CT Angio Chest Pulmonary Embolism (PE) W or WO Contrast  Result Date: 10/30/2021 CLINICAL DATA:  Rule out pulmonary embolus. Left-sided chest pain with cough and shortness of breath. EXAM: CT ANGIOGRAPHY CHEST  WITH CONTRAST TECHNIQUE: Multidetector CT imaging of the chest was performed using the standard protocol during bolus administration of intravenous contrast. Multiplanar CT image reconstructions and MIPs were obtained to evaluate the vascular anatomy. CONTRAST:  122mL OMNIPAQUE IOHEXOL 350 MG/ML SOLN COMPARISON:  CT chest 12/04/2016 FINDINGS: Cardiovascular: Satisfactory opacification of the pulmonary arteries to the segmental level. No evidence of pulmonary embolism. Normal heart size. No pericardial effusion. Mediastinum/Nodes: No enlarged mediastinal, hilar, or axillary lymph nodes. Thyroid gland, trachea, and esophagus demonstrate no significant findings. Lungs/Pleura: Small left pleural effusion with overlying atelectasis. There is dense airspace consolidation with surrounding ground-glass attenuation within the posterior left upper lobe and left apex. Air bronchograms can be seen coursing through this area. Patchy areas of ground-glass attenuation are noted within the right upper lobe, image 54/6 and image 78/6. Upper Abdomen: No acute abnormality. Low-density structure and right lobe of liver is too small to characterize measuring 9 mm, image 91/4. This was present on 01/27/2012 and likely represents a benign abnormality such as a cyst. Musculoskeletal: Thoracic spondylosis identified. No acute or suspicious osseous findings. Review of the MIP images confirms the above findings. IMPRESSION: 1. No evidence for acute pulmonary embolus. 2. Dense airspace consolidation with surrounding ground-glass attenuation comprising greater than 50% of the posterior right upper lobe and right apex. Findings are favored to represent pneumonia. Follow-up imaging is advised to ensure resolution and exclude underlying malignancy. 3. Small left pleural effusion with overlying atelectasis. Electronically Signed   By: Kerby Moors M.D.   On: 10/30/2021 12:24   ECHOCARDIOGRAM COMPLETE  Result Date: 10/31/2021    ECHOCARDIOGRAM  REPORT   Patient Name:   Patrice Moates. Date of Exam: 10/31/2021 Medical Rec #:  [id number]           Height:       70.0 in Accession #:    [id number]          Weight:       201.5 lb Date of Birth:  Jul 15, 1946           BSA:          2.094 m Patient Age:    70 years            BP:           128/66 mmHg Patient Gender: M                   HR:           87 bpm. Exam [facility] Procedure: 2D Echo, Cardiac Doppler and Color Doppler Indications:    Atrial Fibrillation  History:        Patient has no prior history of Echocardiogram examinations.                 Arrythmias:Atrial Fibrillation; Risk Factors:Hypertension and                 Dyslipidemia.  Sonographer:    Wenda Low Referring Phys: [place]  1. Left ventricular ejection fraction, by estimation, is 60 to 65%. The left ventricle has normal function. The left ventricle has no regional wall motion abnormalities. There is mild left ventricular hypertrophy. Left ventricular diastolic parameters were normal.  2. Right ventricular systolic function is normal. The right ventricular size is normal. There is normal pulmonary artery systolic pressure.  3. The mitral valve is normal in structure. Trivial mitral valve regurgitation. No evidence of mitral stenosis.  4. The aortic valve was not well visualized. There is mild calcification of the aortic valve. There is mild thickening of the aortic valve. Aortic valve regurgitation is not visualized. No aortic stenosis is present.  5. The inferior vena cava is normal in size with greater than 50% respiratory variability, suggesting right atrial pressure of 3 mmHg. FINDINGS  Left Ventricle: Left ventricular ejection fraction, by estimation, is 60 to 65%. The left ventricle has normal function. The left ventricle has no regional wall motion abnormalities. The left ventricular internal cavity size was normal in size. There is  mild left ventricular hypertrophy. Left ventricular  diastolic parameters were normal. Right Ventricle: The right ventricular size is normal. No increase in right ventricular wall thickness. Right ventricular systolic function is normal. There is normal pulmonary artery systolic pressure. The tricuspid regurgitant velocity is 2.00 m/s, and  with an assumed right atrial pressure of 3 mmHg, the estimated right ventricular systolic pressure is 94.8 mmHg. Left Atrium: Left atrial size was normal in size. Right Atrium: Right atrial size was normal in size. Pericardium: There is no evidence of pericardial effusion. Mitral Valve: The mitral valve is normal in structure. Trivial mitral valve regurgitation. No evidence of mitral valve stenosis. MV peak gradient, 4.8 mmHg. The mean mitral valve gradient is 2.0 mmHg. Tricuspid Valve: The tricuspid valve is normal in structure. Tricuspid valve regurgitation is not demonstrated. Aortic Valve: The aortic valve was not well visualized. There is mild calcification of the aortic valve. There is mild thickening of the aortic valve. There is mild aortic valve annular calcification. Aortic valve regurgitation is not visualized. No aortic stenosis is present. Aortic valve mean gradient measures 8.0 mmHg. Aortic valve peak gradient measures 15.1 mmHg. Aortic valve area, by VTI measures 2.38 cm. Pulmonic Valve: The pulmonic valve was not well visualized. Pulmonic valve regurgitation is not visualized. No evidence of pulmonic stenosis. Aorta: The aortic root is normal in size and structure. Venous: The inferior vena cava is normal in size with greater than 50% respiratory variability, suggesting right atrial pressure of 3 mmHg. IAS/Shunts: No atrial level shunt detected by color flow Doppler.  LEFT VENTRICLE PLAX 2D LVIDd:         4.70 cm     Diastology LVIDs:         2.50 cm     LV e' medial:    12.10 cm/s LV PW:         1.10 cm     LV E/e' medial:  8.1 LV IVS:  1.10 cm     LV e' lateral:   8.27 cm/s LVOT diam:     2.00 cm     LV  E/e' lateral: 11.8 LV SV:         85 LV SV Index:   41 LVOT Area:     3.14 cm  LV Volumes (MOD) LV vol d, MOD A2C: 54.0 ml LV vol d, MOD A4C: 51.6 ml LV vol s, MOD A2C: 14.9 ml LV vol s, MOD A4C: 20.3 ml LV SV MOD A2C:     39.1 ml LV SV MOD A4C:     51.6 ml LV SV MOD BP:      34.2 ml RIGHT VENTRICLE RV Basal diam:  3.35 cm RV Mid diam:    2.70 cm RV S prime:     14.50 cm/s TAPSE (M-mode): 2.5 cm LEFT ATRIUM             Index        RIGHT ATRIUM           Index LA diam:        3.40 cm 1.62 cm/m   RA Area:     19.40 cm LA Vol (A2C):   65.5 ml 31.28 ml/m  RA Volume:   60.30 ml  28.80 ml/m LA Vol (A4C):   47.0 ml 22.45 ml/m LA Biplane Vol: 59.3 ml 28.32 ml/m  AORTIC VALVE                     PULMONIC VALVE AV Area (Vmax):    2.38 cm      PV Vmax:       1.02 m/s AV Area (Vmean):   2.26 cm      PV Peak grad:  4.2 mmHg AV Area (VTI):     2.38 cm AV Vmax:           194.00 cm/s AV Vmean:          134.000 cm/s AV VTI:            0.359 m AV Peak Grad:      15.1 mmHg AV Mean Grad:      8.0 mmHg LVOT Vmax:         147.00 cm/s LVOT Vmean:        96.400 cm/s LVOT VTI:          0.272 m LVOT/AV VTI ratio: 0.76  AORTA Ao Root diam: 3.00 cm MITRAL VALVE                TRICUSPID VALVE MV Area (PHT): 4.12 cm     TR Peak grad:   16.0 mmHg MV Area VTI:   2.82 cm     TR Vmax:        200.00 cm/s MV Peak grad:  4.8 mmHg MV Mean grad:  2.0 mmHg     SHUNTS MV Vmax:       1.10 m/s     Systemic VTI:  0.27 m MV Vmean:      61.7 cm/s    Systemic Diam: 2.00 cm MV Decel Time: 184 msec MV E velocity: 97.70 cm/s MV A velocity: 102.00 cm/s MV E/A ratio:  0.96 Carlyle Dolly MD Electronically signed by Carlyle Dolly MD Signature Date/Time: 10/31/2021/1:47:27 PM    Final         Scheduled Meds:  ALPRAZolam  0.5 mg Oral QHS   apixaban  5 mg Oral BID   Chlorhexidine Gluconate Cloth  6 each Topical Daily   diltiazem  30 mg Oral Q6H   DULoxetine  20 mg Oral QHS   mometasone-formoterol  2 puff Inhalation BID   montelukast  10 mg  Oral Daily   pantoprazole  40 mg Oral Daily   polyethylene glycol  17 g Oral Daily   Continuous Infusions:  azithromycin 500 mg (10/31/21 1214)   cefTRIAXone (ROCEPHIN)  IV 2 g (11/01/21 0550)   diltiazem (CARDIZEM) infusion Stopped (10/31/21 1245)     LOS: 3 days     Cordelia Poche, MD [facility] 11/01/2021, 8:09 AM  If 7PM-7AM, please contact night-coverage [email or web address]

## 2021-11-01 NOTE — Discharge Instructions (Addendum)
Roberto Sanchez.,  You were in the hospital with pneumonia and have improved with antibiotics. While you were here, you also developed atrial fibrillation and have been started on a blood thinner (Eliquis) and rate control medication (diltiazem/Cardizem). Please follow-up with the cardiologist as scheduled. Please continue the rest of your antibiotic course.   Information on my medicine - ELIQUIS (apixaban)  This medication education was reviewed with me or my healthcare representative as part of my discharge preparation.    Why was Eliquis prescribed for you? Eliquis was prescribed for you to reduce the risk of a blood clot forming that can cause a stroke if you have a medical condition called atrial fibrillation (a type of irregular heartbeat).  What do You need to know about Eliquis ? Take your Eliquis TWICE DAILY - one tablet in the morning and one tablet in the evening with or without food. If you have difficulty swallowing the tablet whole please discuss with your pharmacist how to take the medication safely.  Take Eliquis exactly as prescribed by your doctor and DO NOT stop taking Eliquis without talking to the doctor who prescribed the medication.  Stopping may increase your risk of developing a stroke.  Refill your prescription before you run out.  After discharge, you should have regular check-up appointments with your healthcare provider that is prescribing your Eliquis.  In the future your dose may need to be changed if your kidney function or weight changes by a significant amount or as you get older.  What do you do if you miss a dose? If you miss a dose, take it as soon as you remember on the same day and resume taking twice daily.  Do not take more than one dose of ELIQUIS at the same time to make up a missed dose.  Important Safety Information A possible side effect of Eliquis is bleeding. You should call your healthcare provider right away if you experience any of  the following: Bleeding from an injury or your nose that does not stop. Unusual colored urine (red or dark brown) or unusual colored stools (red or black). Unusual bruising for unknown reasons. A serious fall or if you hit your head (even if there is no bleeding).  Some medicines may interact with Eliquis and might increase your risk of bleeding or clotting while on Eliquis. To help avoid this, consult your healthcare provider or pharmacist prior to using any new prescription or non-prescription medications, including herbals, vitamins, non-steroidal anti-inflammatory drugs (NSAIDs) and supplements.  This website has more information on Eliquis (apixaban): http://www.eliquis.com/eliquis/home

## 2021-11-01 NOTE — Assessment & Plan Note (Addendum)
Secondary to pneumonia. Patient with persistent dyspnea with mobility, even within his bed. PT recommending home health.  Able to wean oxygen to room air at rest. Patient ambulated with pulse oximetry and did not require supplemental oxygen on discharge.

## 2021-11-01 NOTE — Progress Notes (Signed)
[facility] notified of pts admission. Notification ID is [id number].

## 2021-11-02 ENCOUNTER — Inpatient Hospital Stay (HOSPITAL_COMMUNITY): Payer: No Typology Code available for payment source

## 2021-11-02 LAB — BASIC METABOLIC PANEL
Anion gap: 5 (ref 5–15)
BUN: 19 mg/dL (ref 8–23)
CO2: 27 mmol/L (ref 22–32)
Calcium: 8.2 mg/dL — ABNORMAL LOW (ref 8.9–10.3)
Chloride: 106 mmol/L (ref 98–111)
Creatinine, Ser: 0.65 mg/dL (ref 0.61–1.24)
GFR, Estimated: >60 mL/min (ref >60)
Glucose, Bld: 104 mg/dL — ABNORMAL HIGH (ref 70–99)
Potassium: 3.9 mmol/L (ref 3.5–5.1)
Sodium: 138 mmol/L (ref 135–145)

## 2021-11-02 LAB — CBC
HCT: 40.3 % (ref 39.0–52.0)
Hemoglobin: 13.4 g/dL (ref 13.0–17.0)
MCH: 32.6 pg (ref 26.0–34.0)
MCHC: 33.3 g/dL (ref 30.0–36.0)
MCV: 98.1 fL (ref 80.0–100.0)
Platelets: 455 10*3/uL — ABNORMAL HIGH (ref 150–400)
RBC: 4.11 MIL/uL — ABNORMAL LOW (ref 4.22–5.81)
RDW: 13.2 % (ref 11.5–15.5)
WBC: 12.4 10*3/uL — ABNORMAL HIGH (ref 4.0–10.5)
nRBC: 0 % (ref 0.0–0.2)

## 2021-11-02 LAB — PROCALCITONIN: Procalcitonin: 0.35 ng/mL

## 2021-11-02 MED ORDER — IPRATROPIUM-ALBUTEROL 0.5-2.5 (3) MG/3ML IN SOLN
3.0000 mL | Freq: Two times a day (BID) | RESPIRATORY_TRACT | Status: DC
  Administered 2021-11-03: 3 mL via RESPIRATORY_TRACT
  Filled 2021-11-02: qty 3

## 2021-11-02 MED ORDER — BISACODYL 5 MG PO TBEC
5.0000 mg | DELAYED_RELEASE_TABLET | Freq: Every day | ORAL | Status: DC | PRN
  Administered 2021-11-02: 5 mg via ORAL
  Filled 2021-11-02: qty 1

## 2021-11-02 NOTE — TOC Initial Note (Signed)
Transition of Care (TOC) - Initial/Assessment Note    Patient Details  Name: Roberto Sanchez. MRN: [id number] Date of Birth: February 05, 1946  Transition of Care [facility]) CM/SW Contact:    Iona Beard, [place] Phone Number: 11/02/2021, 3:14 PM  Clinical Narrative:                 CSW spoke with pts wife to complete assessment as pt is high risk for readmission. Pt lives with his wife and is independent in completing ADLs at baseline. Pt is able to provide his own transportation. Pt has had HH in the past but cannot remember the company. They are agreeable to [facility] with any company. CSW to make referral. Pt has a cane, Devan, and bsc to use in the home when needed. TOC to follow.   Expected Discharge Plan: [place] Barriers to Discharge: Continued Medical Work up   Patient Goals and CMS Choice Patient states their goals for this hospitalization and ongoing recovery are:: Home with [facility] CMS Medicare.gov Compare Post Acute Care list provided to:: Patient Choice offered to / list presented to : Patient  Expected Discharge Plan and Services Expected Discharge Plan: [place] In-house Referral: Clinical Social Work Discharge Planning Services: CM Consult Post Acute Care Choice: [place] arrangements for the past 2 months: Single Family Home                                      Prior Living Arrangements/Services Living arrangements for the past 2 months: Single Family Home Lives with:: Spouse Patient language and need for interpreter reviewed:: Yes Do you feel safe going back to the place where you live?: Yes      Need for Family Participation in Patient Care: Yes (Comment) Care giver support system in place?: Yes (comment) Current home services: DME (Par[place], cane, bsc) Criminal Activity/Legal Involvement Pertinent to Current Situation/Hospitalization: No - Comment as needed  Activities of Daily Living Home Assistive  Devices/Equipment: Nebulizer ADL Screening (condition at time of admission) Patient's cognitive ability adequate to safely complete daily activities?: Yes Is the patient deaf or have difficulty hearing?: Yes Does the patient have difficulty seeing, even when wearing glasses/contacts?: No Does the patient have difficulty concentrating, remembering, or making decisions?: No Patient able to express need for assistance with ADLs?: Yes Does the patient have difficulty dressing or bathing?: No Independently performs ADLs?: Yes (appropriate for developmental age) Does the patient have difficulty walking or climbing stairs?: No Weakness of Legs: None Weakness of Arms/Hands: None  Permission Sought/Granted                  Emotional Assessment Appearance:: Appears stated age Attitude/Demeanor/Rapport: Engaged Affect (typically observed): Accepting Orientation: : Oriented to Self, Oriented to Place, Oriented to  Time, Oriented to Situation Alcohol / Substance Use: Not Applicable Psych Involvement: No (comment)  Admission diagnosis:  AKI (acute kidney injury) ([place]) [N17.9] Severe sepsis ([place]) [A41.9, R65.20] Community acquired pneumonia of left upper lobe of lung [J18.9] Sepsis with acute hypoxic respiratory failure, due to unspecified organism, unspecified whether septic shock present ([place]) [A41.9, R65.20, J96.01] Patient Active Problem List   Diagnosis Date Noted   Acute respiratory failure with hypoxia ([place]) 11/01/2021   Atrial fibrillation with RVR ([place]) 10/31/2021   Community acquired pneumonia 10/31/2021   Elevated d-dimer 10/31/2021   Hyperlipidemia 10/31/2021   Severe sepsis (  [place]) 10/29/2021   Pill dysphagia 04/05/2021   History of colonic polyps 01/31/2021   Bloating 07/25/2020   GERD (gastroesophageal reflux disease) 07/25/2020   Constipation 04/20/2020   Family history of colon cancer 04/20/2020   Rupture of right triceps tendon 09/01/2015   Anxiety 03/20/2015    Asthma, chronic    Vertigo 03/19/2015   Essential hypertension 03/19/2015   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 [place] [address] Phone: [phone] Fax: [phone]  [facility] [phone] - [place], [place] [address] [place] [address] [place] [address] Phone: [phone] Fax: [phone]     Social Determinants of Health (SDOH) Interventions    Readmission Risk Interventions Readmission Risk Prevention Plan 11/02/2021  Transportation Screening Complete  HRI or Home Care Consult Complete  Social Work Consult for Cut Off Planning/Counseling Complete  Palliative Care Screening Not Applicable  Medication Review Press photographer) Complete  Some recent data might be hidden

## 2021-11-02 NOTE — Progress Notes (Signed)
PROGRESS NOTE    Roberto Sing.  KCL:[id number] DOB: 08/17/1946 DOA: 10/29/2021 PCP: [facility], [name]   Brief Narrative: Roberto Sing. Is a 75 y.o. male with a history of hyperlipidemia, GERD, asthma and anxiety. Patient presented secondary to left sided chest pain and dyspnea and was found to have evidence of sepsis from community acquired pneumonia. Patient started on empiric antibiotics and is improving. While admitted, he developed atrial fibrillation with RVR and was started on a Cardizem IV infusion. Cardiology was consulted for persistent tachycardia while on max Cardizem, but patient converted back to sinus rhythm shortly after. Eliquis started for stroke prevention and patient transitioned to PO Cardizem.   Assessment & Plan:   * Severe sepsis ([place]) Likely secondary to pneumonia.  Blood cultures with no growth to date. Urine culture without growth. Procalcitonin trending down. Leukocytosis trended down with treatment.  Acute respiratory failure with hypoxia (HCC) Secondary to pneumonia. Patient with persistent dyspnea with mobility, even within his bed. PT recommending home health. -Wean oxygen as able -Out of bed as able; check pulse ox with ambulation  Community acquired pneumonia Multifocal pneumonia. Empirically treated with Ceftriaxone and azithromycin. Seeming positive progress however, worsened left sided rales this morning -Continue Ceftriaxone/Azithromycin -Flutter valve -Mucinex -Repeat chest x-ray  Atrial fibrillation with RVR ([place]) New onset and in setting of acute illness. HR resting between 120-140 bpm. Blood pressure supportive. Given Cardizem 5 mg and metoprolol 5 mg IV without improvement of HR. Cardizem IV drip started and maxed out without improvement in HR, so cardiology was consulted. Shortly after consultation, patient converted back into sinus rhythm and is now transitioned to Cardizem PO. Transthoracic Echocardiogram  unremarkable. -Continue Cardizem, Eliquis  -Cardiology recommendations: pending today  Hyperlipidemia Patient is on omega 3 fatty acids as an outpatient  Elevated d-dimer CTA chest without evidence of acute PE. No clinical indication of LE DVT.  GERD (gastroesophageal reflux disease) Patient is on omeprazole as an outpatient -Continue Protonix  Anxiety -Continue Xanax  Asthma, chronic -Continue Dulera  Essential hypertension Patient is on hydrochlorothiazide, losartan as an outpatient. Antihypertensives held on admission secondary to sepsis. Started on Cardizem for atrial fibrillation -Continue Cardizem per cardiology  AKI (acute kidney injury) (HCC)-resolved as of 10/31/2021 Secondary to sepsis and acute infection. Baseline creatinine of 0.7-0.9 with creatinine of 1.9 on admission. Resolved with IV fluids.    DVT prophylaxis: Eliquis Code Status:   Code Status: Full Code Family Communication: None at bedside. Patient declined for me to call anyone. Disposition Plan: Discharge home with home health pending functional improvement as patient is still dyspneic with mobility. Exam findings concern for acutely developing pathology.   Consultants:  Cardiology  Procedures:  TRANSTHORACIC ECHOCARDIOGRAM (10/31/2021) IMPRESSIONS     1. Left ventricular ejection fraction, by estimation, is 60 to 65%. The  left ventricle has normal function. The left ventricle has no regional  wall motion abnormalities. There is mild left ventricular hypertrophy.  Left ventricular diastolic parameters  were normal.   2. Right ventricular systolic function is normal. The right ventricular  size is normal. There is normal pulmonary artery systolic pressure.   3. The mitral valve is normal in structure. Trivial mitral valve  regurgitation. No evidence of mitral stenosis.   4. The aortic valve was not well visualized. There is mild calcification  of the aortic valve. There is mild thickening of the  aortic valve. Aortic  valve regurgitation is not visualized. No aortic stenosis is present.  5. The inferior vena cava is normal in size with greater than 50%  respiratory variability, suggesting right atrial pressure of 3 mmHg.   Antimicrobials: Ceftriaxone Azithromycin    Subjective: Continues to feel okay although he mentions his cough is troublesome. Not much production. Still very dyspneic with mobility, but was able to walk to his room door and back.  Objective: Vitals:   11/01/21 2051 11/02/21 0432 11/02/21 0723 11/02/21 1013  BP: (!) 157/76 (!) 149/74    Pulse: 86 85    Resp: 19 17    Temp: 97.6 F (36.4 C) 98.8 F (37.1 C)    TempSrc: Oral Oral    SpO2: 99% 94% 95% 95%  Weight:      Height:        Intake/Output Summary (Last 24 hours) at 11/02/2021 1209 Last data filed at 11/02/2021 0900 Gross per 24 hour  Intake 1214.12 ml  Output 1000 ml  Net 214.12 ml    Filed Weights   10/30/21 0548 10/31/21 0848 11/01/21 0631  Weight: 93.5 kg 91.4 kg 93.7 kg    Examination:  General exam: Appears calm and comfortable and in no acute distress. Conversant Respiratory: Significantly left sided rales. No significant wheezing noted. Respiratory effort normal with no intercostal retractions or use of accessory muscles Cardiovascular: S1 & S2 heard, RRR. No murmurs, rubs, gallops or clicks. Right hand edema with mild pitting. Gastrointestinal: Abdomen is non-distended, soft and non-tender. No masses felt. Normal bowel sounds heard Neurologic: No focal neurological deficits Musculoskeletal: No calf tenderness Skin: No cyanosis. No new rashes Psychiatry: Alert and oriented. Memory intact. Mood & affect appropriate    Data Reviewed: I have personally reviewed following labs and imaging studies  CBC Lab Results  Component Value Date   WBC 13.8 (H) 11/01/2021   RBC 3.56 (L) 11/01/2021   HGB 11.9 (L) 11/01/2021   HCT 34.7 (L) 11/01/2021   MCV 97.5 11/01/2021   MCH 33.4  11/01/2021   PLT 400 11/01/2021   MCHC 34.3 11/01/2021   RDW 13.3 11/01/2021   LYMPHSABS 1.4 10/29/2021   MONOABS 0.0 (L) 10/29/2021   EOSABS 0.0 10/29/2021   BASOSABS 0.0 25/42/7062     Last metabolic panel Lab Results  Component Value Date   NA 138 11/02/2021   K 3.9 11/02/2021   CL 106 11/02/2021   CO2 27 11/02/2021   BUN 19 11/02/2021   CREATININE 0.65 11/02/2021   GLUCOSE 104 (H) 11/02/2021   GFRNONAA >60 11/02/2021   GFRAA >60 11/13/2018   CALCIUM 8.2 (L) 11/02/2021   PROT 5.2 (L) 10/30/2021   ALBUMIN 2.4 (L) 10/30/2021   BILITOT 0.6 10/30/2021   ALKPHOS 87 10/30/2021   AST 15 10/30/2021   ALT 14 10/30/2021   ANIONGAP 5 11/02/2021    CBG (last 3)  No results for input(s): GLUCAP in the last 72 hours.   GFR: Estimated Creatinine Clearance: 91.7 mL/min (by C-G formula based on SCr of 0.65 mg/dL).  Coagulation Profile: Recent Labs  Lab 10/29/21 1212  INR 1.1     Recent Results (from the past 240 hour(s))  Blood Culture (routine x 2)     Status: None (Preliminary result)   Collection Time: 10/29/21 12:12 PM   Specimen: Left Antecubital; Blood  Result Value Ref Range Status   Specimen Description LEFT ANTECUBITAL  Final   Special Requests   Final    BOTTLES DRAWN AEROBIC AND ANAEROBIC Blood Culture adequate volume   Culture   Final  NO GROWTH 4 DAYS Performed at [facility], [address] [place], [place] [address]    Report Status PENDING  Incomplete  Blood Culture (routine x 2)     Status: None (Preliminary result)   Collection Time: 10/29/21 12:12 PM   Specimen: Right Antecubital; Blood  Result Value Ref Range Status   Specimen Description RIGHT ANTECUBITAL  Final   Special Requests   Final    BOTTLES DRAWN AEROBIC AND ANAEROBIC Blood Culture adequate volume   Culture   Final    NO GROWTH 4 DAYS Performed at [facility], [address], [place], [place] [address]    Report Status PENDING  Incomplete  Resp Panel by RT-PCR (Flu A&B, Covid)  Nasopharyngeal Swab     Status: None   Collection Time: 10/29/21 12:12 PM   Specimen: Nasopharyngeal Swab; Nasopharyngeal(NP) swabs in vial transport medium  Result Value Ref Range Status   SARS Coronavirus 2 by RT PCR NEGATIVE NEGATIVE Final    Comment: (NOTE) SARS-CoV-2 target nucleic acids are NOT DETECTED.  The SARS-CoV-2 RNA is generally detectable in upper respiratory specimens during the acute phase of infection. The lowest concentration of SARS-CoV-2 viral copies this assay can detect is 138 copies/mL. A negative result does not preclude SARS-Cov-2 infection and should not be used as the sole basis for treatment or other patient management decisions. A negative result may occur with  improper specimen collection/handling, submission of specimen other than nasopharyngeal swab, presence of viral mutation(s) within the areas targeted by this assay, and inadequate number of viral copies(<138 copies/mL). A negative result must be combined with clinical observations, patient history, and epidemiological information. The expected result is Negative.  Fact Sheet for Patients:  EntrepreneurPulse.com.au  Fact Sheet for Healthcare Providers:  IncredibleEmployment.be  This test is no t yet approved or cleared by the [place] FDA and  has been authorized for detection and/or diagnosis of SARS-CoV-2 by FDA under an Emergency Use Authorization (EUA). This EUA will remain  in effect (meaning this test can be used) for the duration of the COVID-19 declaration under Section 564(b)(1) of the Act, 21 U.S.C.section 360bbb-3(b)(1), unless the authorization is terminated  or revoked sooner.       Influenza A by PCR NEGATIVE NEGATIVE Final   Influenza B by PCR NEGATIVE NEGATIVE Final    Comment: (NOTE) The Xpert Xpress SARS-CoV-2/FLU/RSV plus assay is intended as an aid in the diagnosis of influenza from Nasopharyngeal swab specimens and should not be  used as a sole basis for treatment. Nasal washings and aspirates are unacceptable for Xpert Xpress SARS-CoV-2/FLU/RSV testing.  Fact Sheet for Patients: EntrepreneurPulse.com.au  Fact Sheet for Healthcare Providers: IncredibleEmployment.be  This test is not yet approved or cleared by the [place] FDA and has been authorized for detection and/or diagnosis of SARS-CoV-2 by FDA under an Emergency Use Authorization (EUA). This EUA will remain in effect (meaning this test can be used) for the duration of the COVID-19 declaration under Section 564(b)(1) of the Act, 21 U.S.C. section 360bbb-3(b)(1), unless the authorization is terminated or revoked.  Performed at [facility], [address], [place], [place] [address]   Urine Culture     Status: None   Collection Time: 10/29/21  2:54 PM   Specimen: Urine, Catheterized  Result Value Ref Range Status   Specimen Description   Final    URINE, CATHETERIZED Performed at [facility], [address] [place], [place] [address]    Special Requests   Final  NONE Performed at [facility], [address] [place], [place] [address]    Culture   Final    NO GROWTH Performed at [facility], [place] [address], [place], [place] [address]    Report Status 10/30/2021 FINAL  Final  MRSA Next Gen by PCR, Nasal     Status: None   Collection Time: 10/29/21  7:44 PM   Specimen: Nasal Mucosa; Nasal Swab  Result Value Ref Range Status   MRSA by PCR Next Gen NOT DETECTED NOT DETECTED Final    Comment: (NOTE) The GeneXpert MRSA Assay (FDA approved for NASAL specimens only), is one component of a comprehensive MRSA colonization surveillance program. It is not intended to diagnose MRSA infection nor to guide or monitor treatment for MRSA infections. Test performance is not FDA approved in patients less than 60 years old. Performed at [facility], [address] [place],  [place] [address]           Radiology Studies: DG CHEST PORT 1 VIEW  Result Date: 11/02/2021 CLINICAL DATA:  Pneumonia EXAM: PORTABLE CHEST 1 VIEW COMPARISON:  Previous studies including the examination of 10/29/2021 FINDINGS: Cardiac size is within normal limits. Patchy alveolar infiltrates are seen in the left upper lung fields with interval worsening. There are patchy infiltrates in the left lower lung fields with interval worsening. There is blunting of left lateral CP angle suggesting small pleural effusion. Right lateral CP angle is clear. There is no pneumothorax. IMPRESSION: There is interval worsening of pneumonia in the left upper and left lower lung fields. Small left pleural effusion. Electronically Signed   By: Elmer Picker M.D.   On: 11/02/2021 11:52   ECHOCARDIOGRAM COMPLETE  Result Date: 10/31/2021    ECHOCARDIOGRAM REPORT   Patient Name:   Roberto Sanchez. Date of Exam: 10/31/2021 Medical Rec #:  [id number]           Height:       70.0 in Accession #:    [id number]          Weight:       201.5 lb Date of Birth:  [birth year]           BSA:          2.094 m Patient Age:    43 years            BP:           128/66 mmHg Patient Gender: M                   HR:           87 bpm. Exam Location:  [facility] Procedure: 2D Echo, Cardiac Doppler and Color Doppler Indications:    Atrial Fibrillation  History:        Patient has no prior history of Echocardiogram examinations.                 Arrythmias:Atrial Fibrillation; Risk Factors:Hypertension and                 Dyslipidemia.  Sonographer:    Wenda Low Referring Phys: [place]  1. Left ventricular ejection fraction, by estimation, is 60 to 65%. The left ventricle has normal function. The left ventricle has no regional wall motion abnormalities. There is mild left ventricular hypertrophy. Left ventricular diastolic parameters were normal.  2. Right ventricular systolic function is normal. The right ventricular size is normal.  There is normal pulmonary  artery systolic pressure.  3. The mitral valve is normal in structure. Trivial mitral valve regurgitation. No evidence of mitral stenosis.  4. The aortic valve was not well visualized. There is mild calcification of the aortic valve. There is mild thickening of the aortic valve. Aortic valve regurgitation is not visualized. No aortic stenosis is present.  5. The inferior vena cava is normal in size with greater than 50% respiratory variability, suggesting right atrial pressure of 3 mmHg. FINDINGS  Left Ventricle: Left ventricular ejection fraction, by estimation, is 60 to 65%. The left ventricle has normal function. The left ventricle has no regional wall motion abnormalities. The left ventricular internal cavity size was normal in size. There is  mild left ventricular hypertrophy. Left ventricular diastolic parameters were normal. Right Ventricle: The right ventricular size is normal. No increase in right ventricular wall thickness. Right ventricular systolic function is normal. There is normal pulmonary artery systolic pressure. The tricuspid regurgitant velocity is 2.00 m/s, and  with an assumed right atrial pressure of 3 mmHg, the estimated right ventricular systolic pressure is 00.9 mmHg. Left Atrium: Left atrial size was normal in size. Right Atrium: Right atrial size was normal in size. Pericardium: There is no evidence of pericardial effusion. Mitral Valve: The mitral valve is normal in structure. Trivial mitral valve regurgitation. No evidence of mitral valve stenosis. MV peak gradient, 4.8 mmHg. The mean mitral valve gradient is 2.0 mmHg. Tricuspid Valve: The tricuspid valve is normal in structure. Tricuspid valve regurgitation is not demonstrated. Aortic Valve: The aortic valve was not well visualized. There is mild calcification of the aortic valve. There is mild thickening of the aortic valve. There is mild aortic valve annular calcification. Aortic valve regurgitation is not  visualized. No aortic stenosis is present. Aortic valve mean gradient measures 8.0 mmHg. Aortic valve peak gradient measures 15.1 mmHg. Aortic valve area, by VTI measures 2.38 cm. Pulmonic Valve: The pulmonic valve was not well visualized. Pulmonic valve regurgitation is not visualized. No evidence of pulmonic stenosis. Aorta: The aortic root is normal in size and structure. Venous: The inferior vena cava is normal in size with greater than 50% respiratory variability, suggesting right atrial pressure of 3 mmHg. IAS/Shunts: No atrial level shunt detected by color flow Doppler.  LEFT VENTRICLE PLAX 2D LVIDd:         4.70 cm     Diastology LVIDs:         2.50 cm     LV e' medial:    12.10 cm/s LV PW:         1.10 cm     LV E/e' medial:  8.1 LV IVS:        1.10 cm     LV e' lateral:   8.27 cm/s LVOT diam:     2.00 cm     LV E/e' lateral: 11.8 LV SV:         85 LV SV Index:   41 LVOT Area:     3.14 cm  LV Volumes (MOD) LV vol d, MOD A2C: 54.0 ml LV vol d, MOD A4C: 51.6 ml LV vol s, MOD A2C: 14.9 ml LV vol s, MOD A4C: 20.3 ml LV SV MOD A2C:     39.1 ml LV SV MOD A4C:     51.6 ml LV SV MOD BP:      34.2 ml RIGHT VENTRICLE RV Basal diam:  3.35 cm RV Mid diam:    2.70 cm RV S prime:  14.50 cm/s TAPSE (M-mode): 2.5 cm LEFT ATRIUM             Index        RIGHT ATRIUM           Index LA diam:        3.40 cm 1.62 cm/m   RA Area:     19.40 cm LA Vol (A2C):   65.5 ml 31.28 ml/m  RA Volume:   60.30 ml  28.80 ml/m LA Vol (A4C):   47.0 ml 22.45 ml/m LA Biplane Vol: 59.3 ml 28.32 ml/m  AORTIC VALVE                     PULMONIC VALVE AV Area (Vmax):    2.38 cm      PV Vmax:       1.02 m/s AV Area (Vmean):   2.26 cm      PV Peak grad:  4.2 mmHg AV Area (VTI):     2.38 cm AV Vmax:           194.00 cm/s AV Vmean:          134.000 cm/s AV VTI:            0.359 m AV Peak Grad:      15.1 mmHg AV Mean Grad:      8.0 mmHg LVOT Vmax:         147.00 cm/s LVOT Vmean:        96.400 cm/s LVOT VTI:          0.272 m LVOT/AV VTI ratio:  0.76  AORTA Ao Root diam: 3.00 cm MITRAL VALVE                TRICUSPID VALVE MV Area (PHT): 4.12 cm     TR Peak grad:   16.0 mmHg MV Area VTI:   2.82 cm     TR Vmax:        200.00 cm/s MV Peak grad:  4.8 mmHg MV Mean grad:  2.0 mmHg     SHUNTS MV Vmax:       1.10 m/s     Systemic VTI:  0.27 m MV Vmean:      61.7 cm/s    Systemic Diam: 2.00 cm MV Decel Time: 184 msec MV E velocity: 97.70 cm/s MV A velocity: 102.00 cm/s MV E/A ratio:  0.96 Carlyle Dolly MD Electronically signed by Carlyle Dolly MD Signature Date/Time: 10/31/2021/1:47:27 PM    Final         Scheduled Meds:  ALPRAZolam  0.5 mg Oral QHS   apixaban  5 mg Oral BID   Chlorhexidine Gluconate Cloth  6 each Topical Daily   diltiazem  120 mg Oral Daily   DULoxetine  20 mg Oral QHS   guaiFENesin  1,200 mg Oral BID   mometasone-formoterol  2 puff Inhalation BID   montelukast  10 mg Oral Daily   pantoprazole  40 mg Oral Daily   polyethylene glycol  17 g Oral Daily   Continuous Infusions:  azithromycin Stopped (11/01/21 1439)   cefTRIAXone (ROCEPHIN)  IV 2 g (11/02/21 7591)     LOS: 4 days     Cordelia Poche, MD [facility] 11/02/2021, 12:09 PM  If 7PM-7AM, please contact night-coverage [email or web address]

## 2021-11-02 NOTE — Progress Notes (Signed)
Walked a good distance with tech in hallway with mask on and on room air and lowest O2 sat was 90%

## 2021-11-03 LAB — CULTURE, BLOOD (ROUTINE X 2)
Culture: NO GROWTH
Culture: NO GROWTH
Special Requests: ADEQUATE
Special Requests: ADEQUATE

## 2021-11-03 MED ORDER — CEFPODOXIME PROXETIL 200 MG PO TABS: 200.0000 mg | ORAL_TABLET | Freq: Two times a day (BID) | ORAL | 0 refills | Status: AC

## 2021-11-03 MED ORDER — IPRATROPIUM-ALBUTEROL 0.5-2.5 (3) MG/3ML IN SOLN: 3.0000 mL | Freq: Four times a day (QID) | RESPIRATORY_TRACT | 0 refills | Status: AC | PRN

## 2021-11-03 MED ORDER — AZITHROMYCIN 250 MG PO TABS: 500.0000 mg | ORAL_TABLET | Freq: Every day | ORAL | Status: DC

## 2021-11-03 MED ORDER — DILTIAZEM HCL ER COATED BEADS 120 MG PO CP24: 120.0000 mg | ORAL_CAPSULE | Freq: Every day | ORAL | 2 refills | Status: DC

## 2021-11-03 MED ORDER — APIXABAN 5 MG PO TABS: 5.0000 mg | ORAL_TABLET | Freq: Two times a day (BID) | ORAL | 2 refills | Status: DC

## 2021-11-03 NOTE — Progress Notes (Signed)
Discharge instructions given to patient. IV and tele monitor d/ced.

## 2021-11-03 NOTE — Discharge Summary (Signed)
Physician Discharge Summary  Roberto Sanchez. ZOX:[id number] DOB: 12/18/1945 DOA: 10/29/2021  PCP: [name]  Admit date: 10/29/2021 Discharge date: 11/03/2021  Admitted From: Home Disposition: Home  Recommendations for Outpatient Follow-up:  Follow up with PCP in 1 week Follow up with cardiology on 12/29 Repeat chest x-ray in 3-4 weeks Please obtain BMP/CBC in one week Please follow up on the following pending results: None  Home Health: PT Equipment/Devices: None  Discharge Condition: Stable CODE STATUS: Full code Diet recommendation: Heart healthy  Brief/Interim Summary:  Admission HPI written by Rodena Goldmann, DO  HPI: Roberto Sanchez. is a 75 y.o. male with medical history significant for dyslipidemia, hypertension, GERD, asthma, and anxiety who was brought in by his wife to the ED for evaluation of left-sided chest pain as well as some shortness of breath that began approximately 2-3 days ago.  This has progressed with worsening pain as well as cough that has been nonproductive.  He is also complained of fever at home with temperature up to 100.5 F.  No chills noted.  He has been taking his other medications regularly and denies any sick contacts.  He apparently tested at home for COVID and was noted to be negative.  He tried taking ibuprofen with no improvement.   Hospital course:  * Severe sepsis ([place]) Likely secondary to pneumonia.  Blood cultures with no growth to date. Urine culture without growth. Procalcitonin trending down. Leukocytosis trended down with treatment.  Acute respiratory failure with hypoxia (HCC) Secondary to pneumonia. Patient with persistent dyspnea with mobility, even within his bed. PT recommending home health.  Able to wean oxygen to room air at rest. Patient ambulated with pulse oximetry and did not require supplemental oxygen on discharge.  Community acquired pneumonia Multifocal pneumonia. Empirically treated with  Ceftriaxone and azithromycin with improvement. Chest x-ray with interval worsening, but patient with significant improvement. Patient completed 6 days of Azithromycin and 6 days of Ceftriaxone IV. Will discharge with 4 days of Vantin PO. Recommend repeat chest x-ray in 3 to 4 weeks to ensure resolution.   Atrial fibrillation with RVR ([place]) New onset and in setting of acute illness. HR resting between 120-140 bpm. Blood pressure supportive. Given Cardizem 5 mg and metoprolol 5 mg IV without improvement of HR. Cardizem IV drip started and maxed out without improvement in HR, so cardiology was consulted. Shortly after consultation, patient converted back into sinus rhythm and is now transitioned to Cardizem PO. Transthoracic Echocardiogram unremarkable.  Patient discharged on Cardizem CD 120 mg daily and Eliquis 5 mg twice daily.  Patient to follow-up with cardiology as an outpatient.  Hyperlipidemia Patient is on omega 3 fatty acids as an outpatient  Elevated d-dimer CTA chest without evidence of acute PE. No clinical indication of LE DVT.  GERD (gastroesophageal reflux disease) Patient is on omeprazole as an outpatient. Continue on discharge.  Anxiety Continue Xanax.  Asthma, chronic Continue Dulera.  DuoNeb ordered on discharge.  Essential hypertension Patient is on hydrochlorothiazide, losartan as an outpatient. Antihypertensives held on admission secondary to sepsis. Started on Cardizem for atrial fibrillation. Continue Cardizem on discharge. Discontinue hydrochlorothiazide and losartan on discharge.  AKI (acute kidney injury) (HCC)-resolved as of 10/31/2021 Secondary to sepsis and acute infection. Baseline creatinine of 0.7-0.9 with creatinine of 1.9 on admission. Resolved with IV fluids.   Discharge Diagnoses:  Principal Problem:   Severe sepsis [facility]) Active Problems:   Atrial fibrillation with RVR ([place])   Community acquired  pneumonia   Acute respiratory failure with hypoxia  (HCC)   Essential hypertension   Asthma, chronic   Anxiety   GERD (gastroesophageal reflux disease)   Elevated d-dimer   Hyperlipidemia    Discharge Instructions  Discharge Instructions     Call MD for:  difficulty breathing, headache or visual disturbances   Complete by: As directed    Call MD for:  temperature >100.4   Complete by: As directed    Diet - low sodium heart healthy   Complete by: As directed       Allergies as of 11/03/2021       Reactions   Penicillins Other (See Comments)   Causes patient to "pass out"DID THE REACTION INVOLVE: Swelling of the face/tongue/throat, SOB, or low BP? No Sudden or severe rash/hives, skin peeling, or the inside of the mouth or nose? No Did it require medical treatment? No When did it last happen?       If all above answers are "NO", may proceed with cephalosporin use.   Shellfish Allergy Hives, Shortness Of Breath, Swelling   Facial Swelling  ears turn red   Atorvastatin Other (See Comments)   Muscle pain and muscle weakness   Simvastatin    Muscle pain and muscle weakness   Codeine Nausea And Vomiting        Medication List     STOP taking these medications    aspirin EC 81 MG tablet   hydrochlorothiazide 12.5 MG capsule Commonly known as: MICROZIDE   losartan 100 MG tablet Commonly known as: COZAAR       TAKE these medications    albuterol 108 (90 Base) MCG/ACT inhaler Commonly known as: VENTOLIN HFA Inhale 2 puffs into the lungs every 6 (six) hours as needed for wheezing or shortness of breath.   ALPRAZolam 0.5 MG tablet Commonly known as: XANAX Take 0.5 mg by mouth at bedtime. What changed: Another medication with the same name was removed. Continue taking this medication, and follow the directions you see here.   apixaban 5 MG Tabs tablet Commonly known as: ELIQUIS Take 1 tablet (5 mg total) by mouth 2 (two) times daily.   CALCIUM 600 + D PO Take 1 tablet by mouth daily.   cefpodoxime 200 MG  tablet Commonly known as: VANTIN Take 1 tablet (200 mg total) by mouth 2 (two) times daily for 4 days. Start taking on: November 04, 2021   cetirizine 10 MG tablet Commonly known as: ZYRTEC Take 10 mg by mouth daily.   chlorhexidine 0.12 % solution Commonly known as: PERIDEX Use as directed 15 mLs in the mouth or throat 2 (two) times daily.   diclofenac Sodium 1 % Gel Commonly known as: VOLTAREN Apply 4 g topically 4 (four) times daily.   diltiazem 120 MG 24 hr capsule Commonly known as: CARDIZEM CD Take 1 capsule (120 mg total) by mouth daily. Start taking on: November 04, 2021   DULoxetine 20 MG capsule Commonly known as: CYMBALTA Take 20 mg by mouth at bedtime.   ezetimibe 10 MG tablet Commonly known as: ZETIA Take 10 mg by mouth daily.   Fish Oil 1000 MG Caps Take 1,000 mg by mouth daily.   fluticasone 50 MCG/ACT nasal spray Commonly known as: FLONASE Place 1 spray into both nostrils daily as needed for allergies or rhinitis.   Fluticasone-Salmeterol 250-50 MCG/DOSE Aepb Commonly known as: ADVAIR Inhale 1 puff into the lungs every 12 (twelve) hours.   guaifenesin 400 MG Tabs tablet  Commonly known as: HUMIBID E Take 400 mg by mouth daily as needed (congestion).   hydroxypropyl methylcellulose / hypromellose 2.5 % ophthalmic solution Commonly known as: ISOPTO TEARS / GONIOVISC Place 1 drop into both eyes as needed for dry eyes.   ipratropium-albuterol 0.5-2.5 (3) MG/3ML Soln Commonly known as: DUONEB Take 3 mLs by nebulization every 6 (six) hours as needed.   montelukast 10 MG tablet Commonly known as: SINGULAIR Take 10 mg by mouth daily.   multivitamin with minerals Tabs tablet Take 1 tablet by mouth daily.   niacin 500 MG tablet Take 500 mg by mouth daily.   omeprazole 20 MG capsule Commonly known as: PRILOSEC Take 1 capsule (20 mg total) by mouth 2 (two) times daily before a meal. What changed: when to take this        Juncos, Beebe M, PA-C Follow up on 11/22/2021.   Specialties: Physician Assistant, Cardiology Why: Cardiology Hospital Follow-up on 11/22/2021 at 3:30 PM. Contact information: [place] [address] [phone]         [name]. Schedule an appointment as soon as possible for a visit in 1 week(s).   Why: For hospital follow-up Contact information: [place] [address] [phone]         Arnoldo Lenis, MD .   Specialty: Cardiology Contact information: [place] [address] [phone]                Allergies  Allergen Reactions   Penicillins Other (See Comments)    Causes patient to "pass out"DID THE REACTION INVOLVE: Swelling of the face/tongue/throat, SOB, or low BP? No Sudden or severe rash/hives, skin peeling, or the inside of the mouth or nose? No Did it require medical treatment? No When did it last happen?       If all above answers are "NO", may proceed with cephalosporin use.    Shellfish Allergy Hives, Shortness Of Breath and Swelling    Facial Swelling  ears turn red   Atorvastatin Other (See Comments)    Muscle pain and muscle weakness   Simvastatin     Muscle pain and muscle weakness   Codeine Nausea And Vomiting    Consultations: Cardiology   Procedures/Studies: CT Angio Chest Pulmonary Embolism (PE) W or WO Contrast  Result Date: 10/30/2021 CLINICAL DATA:  Rule out pulmonary embolus. Left-sided chest pain with cough and shortness of breath. EXAM: CT ANGIOGRAPHY CHEST WITH CONTRAST TECHNIQUE: Multidetector CT imaging of the chest was performed using the standard protocol during bolus administration of intravenous contrast. Multiplanar CT image reconstructions and MIPs were obtained to evaluate the vascular anatomy. CONTRAST:  153mL OMNIPAQUE IOHEXOL 350 MG/ML SOLN COMPARISON:  CT chest 12/04/2016 FINDINGS: Cardiovascular: Satisfactory  opacification of the pulmonary arteries to the segmental level. No evidence of pulmonary embolism. Normal heart size. No pericardial effusion. Mediastinum/Nodes: No enlarged mediastinal, hilar, or axillary lymph nodes. Thyroid gland, trachea, and esophagus demonstrate no significant findings. Lungs/Pleura: Small left pleural effusion with overlying atelectasis. There is dense airspace consolidation with surrounding ground-glass attenuation within the posterior left upper lobe and left apex. Air bronchograms can be seen coursing through this area. Patchy areas of ground-glass attenuation are noted within the right upper lobe, image 54/6 and image 78/6. Upper Abdomen: No acute abnormality. Low-density structure and right lobe of liver is too small to characterize measuring 9 mm, image 91/4. This was present on 01/27/2012  and likely represents a benign abnormality such as a cyst. Musculoskeletal: Thoracic spondylosis identified. No acute or suspicious osseous findings. Review of the MIP images confirms the above findings. IMPRESSION: 1. No evidence for acute pulmonary embolus. 2. Dense airspace consolidation with surrounding ground-glass attenuation comprising greater than 50% of the posterior right upper lobe and right apex. Findings are favored to represent pneumonia. Follow-up imaging is advised to ensure resolution and exclude underlying malignancy. 3. Small left pleural effusion with overlying atelectasis. Electronically Signed   By: Kerby Moors M.D.   On: 10/30/2021 12:24   DG CHEST PORT 1 VIEW  Result Date: 11/02/2021 CLINICAL DATA:  Pneumonia EXAM: PORTABLE CHEST 1 VIEW COMPARISON:  Previous studies including the examination of 10/29/2021 FINDINGS: Cardiac size is within normal limits. Patchy alveolar infiltrates are seen in the left upper lung fields with interval worsening. There are patchy infiltrates in the left lower lung fields with interval worsening. There is blunting of left lateral CP angle  suggesting small pleural effusion. Right lateral CP angle is clear. There is no pneumothorax. IMPRESSION: There is interval worsening of pneumonia in the left upper and left lower lung fields. Small left pleural effusion. Electronically Signed   By: Elmer Picker M.D.   On: 11/02/2021 11:52   DG Chest Port 1 View  Result Date: 10/29/2021 CLINICAL DATA:  Chest pain, shortness of breath EXAM: PORTABLE CHEST 1 VIEW COMPARISON:  09/03/2021 FINDINGS: The heart size and mediastinal contours are within normal limits. Hazy left upper lobe airspace opacity. Streaky left basilar opacity. No large pleural effusion. No pneumothorax. IMPRESSION: Hazy left upper lobe airspace opacity suspicious for pneumonia. Streaky left basilar opacity likely atelectasis. Electronically Signed   By: Davina Poke D.O.   On: 10/29/2021 12:38   ECHOCARDIOGRAM COMPLETE  Result Date: 10/31/2021    ECHOCARDIOGRAM REPORT   Patient Name:   Chaitanya Amedee. Date of Exam: 10/31/2021 Medical Rec #:  [id number]           Height:       70.0 in Accession #:    [id number]          Weight:       201.5 lb Date of Birth:  1946-05-28           BSA:          2.094 m Patient Age:    75 years            BP:           128/66 mmHg Patient Gender: M                   HR:           87 bpm. Exam Location:  [facility] Procedure: 2D Echo, Cardiac Doppler and Color Doppler Indications:    Atrial Fibrillation  History:        Patient has no prior history of Echocardiogram examinations.                 Arrythmias:Atrial Fibrillation; Risk Factors:Hypertension and                 Dyslipidemia.  Sonographer:    Wenda Low Referring Phys: [place]  1. Left ventricular ejection fraction, by estimation, is 60 to 65%. The left ventricle has normal function. The left ventricle has no regional wall motion abnormalities. There is mild left ventricular hypertrophy. Left ventricular diastolic parameters were normal.  2. Right ventricular  systolic function is normal. The right ventricular size is normal. There is normal pulmonary artery systolic pressure.  3. The mitral valve is normal in structure. Trivial mitral valve regurgitation. No evidence of mitral stenosis.  4. The aortic valve was not well visualized. There is mild calcification of the aortic valve. There is mild thickening of the aortic valve. Aortic valve regurgitation is not visualized. No aortic stenosis is present.  5. The inferior vena cava is normal in size with greater than 50% respiratory variability, suggesting right atrial pressure of 3 mmHg. FINDINGS  Left Ventricle: Left ventricular ejection fraction, by estimation, is 60 to 65%. The left ventricle has normal function. The left ventricle has no regional wall motion abnormalities. The left ventricular internal cavity size was normal in size. There is  mild left ventricular hypertrophy. Left ventricular diastolic parameters were normal. Right Ventricle: The right ventricular size is normal. No increase in right ventricular wall thickness. Right ventricular systolic function is normal. There is normal pulmonary artery systolic pressure. The tricuspid regurgitant velocity is 2.00 m/s, and  with an assumed right atrial pressure of 3 mmHg, the estimated right ventricular systolic pressure is 10.9 mmHg. Left Atrium: Left atrial size was normal in size. Right Atrium: Right atrial size was normal in size. Pericardium: There is no evidence of pericardial effusion. Mitral Valve: The mitral valve is normal in structure. Trivial mitral valve regurgitation. No evidence of mitral valve stenosis. MV peak gradient, 4.8 mmHg. The mean mitral valve gradient is 2.0 mmHg. Tricuspid Valve: The tricuspid valve is normal in structure. Tricuspid valve regurgitation is not demonstrated. Aortic Valve: The aortic valve was not well visualized. There is mild calcification of the aortic valve. There is mild thickening of the aortic valve. There is mild  aortic valve annular calcification. Aortic valve regurgitation is not visualized. No aortic stenosis is present. Aortic valve mean gradient measures 8.0 mmHg. Aortic valve peak gradient measures 15.1 mmHg. Aortic valve area, by VTI measures 2.38 cm. Pulmonic Valve: The pulmonic valve was not well visualized. Pulmonic valve regurgitation is not visualized. No evidence of pulmonic stenosis. Aorta: The aortic root is normal in size and structure. Venous: The inferior vena cava is normal in size with greater than 50% respiratory variability, suggesting right atrial pressure of 3 mmHg. IAS/Shunts: No atrial level shunt detected by color flow Doppler.  LEFT VENTRICLE PLAX 2D LVIDd:         4.70 cm     Diastology LVIDs:         2.50 cm     LV e' medial:    12.10 cm/s LV PW:         1.10 cm     LV E/e' medial:  8.1 LV IVS:        1.10 cm     LV e' lateral:   8.27 cm/s LVOT diam:     2.00 cm     LV E/e' lateral: 11.8 LV SV:         85 LV SV Index:   41 LVOT Area:     3.14 cm  LV Volumes (MOD) LV vol d, MOD A2C: 54.0 ml LV vol d, MOD A4C: 51.6 ml LV vol s, MOD A2C: 14.9 ml LV vol s, MOD A4C: 20.3 ml LV SV MOD A2C:     39.1 ml LV SV MOD A4C:     51.6 ml LV SV MOD BP:      34.2 ml RIGHT VENTRICLE RV Basal diam:  3.35  cm RV Mid diam:    2.70 cm RV S prime:     14.50 cm/s TAPSE (M-mode): 2.5 cm LEFT ATRIUM             Index        RIGHT ATRIUM           Index LA diam:        3.40 cm 1.62 cm/m   RA Area:     19.40 cm LA Vol (A2C):   65.5 ml 31.28 ml/m  RA Volume:   60.30 ml  28.80 ml/m LA Vol (A4C):   47.0 ml 22.45 ml/m LA Biplane Vol: 59.3 ml 28.32 ml/m  AORTIC VALVE                     PULMONIC VALVE AV Area (Vmax):    2.38 cm      PV Vmax:       1.02 m/s AV Area (Vmean):   2.26 cm      PV Peak grad:  4.2 mmHg AV Area (VTI):     2.38 cm AV Vmax:           194.00 cm/s AV Vmean:          134.000 cm/s AV VTI:            0.359 m AV Peak Grad:      15.1 mmHg AV Mean Grad:      8.0 mmHg LVOT Vmax:         147.00 cm/s LVOT  Vmean:        96.400 cm/s LVOT VTI:          0.272 m LVOT/AV VTI ratio: 0.76  AORTA Ao Root diam: 3.00 cm MITRAL VALVE                TRICUSPID VALVE MV Area (PHT): 4.12 cm     TR Peak grad:   16.0 mmHg MV Area VTI:   2.82 cm     TR Vmax:        200.00 cm/s MV Peak grad:  4.8 mmHg MV Mean grad:  2.0 mmHg     SHUNTS MV Vmax:       1.10 m/s     Systemic VTI:  0.27 m MV Vmean:      61.7 cm/s    Systemic Diam: 2.00 cm MV Decel Time: 184 msec MV E velocity: 97.70 cm/s MV A velocity: 102.00 cm/s MV E/A ratio:  0.96 Carlyle Dolly MD Electronically signed by Carlyle Dolly MD Signature Date/Time: 10/31/2021/1:47:27 PM    Final       Subjective: Patient feels very well today.  No dyspnea.  No chest pain.  Discharge Exam: Vitals:   11/03/21 0850 11/03/21 0908  BP: 130/64   Pulse:    Resp:    Temp:    SpO2:  93%   Vitals:   11/03/21 0359 11/03/21 0500 11/03/21 0850 11/03/21 0908  BP:  140/65 130/64   Pulse:  88    Resp:      Temp:  99.4 F (37.4 C)    TempSrc:  Oral    SpO2: 93% 93%  93%  Weight:      Height:        General: Pt is alert, awake, not in acute distress Cardiovascular: RRR, S1/S2 +, no rubs, no gallops Respiratory: CTA bilaterally, no wheezing, no rhonchi Abdominal: Soft, NT, ND, bowel sounds + Extremities: no edema, no cyanosis  The results of significant diagnostics from this hospitalization (including imaging, microbiology, ancillary and laboratory) are listed below for reference.     Microbiology: Recent Results (from the past 240 hour(s))  Blood Culture (routine x 2)     Status: None   Collection Time: 10/29/21 12:12 PM   Specimen: Left Antecubital; Blood  Result Value Ref Range Status   Specimen Description LEFT ANTECUBITAL  Final   Special Requests   Final    BOTTLES DRAWN AEROBIC AND ANAEROBIC Blood Culture adequate volume   Culture   Final    NO GROWTH 5 DAYS Performed at [facility], [address], [place], [place] [address]    Report Status  11/03/2021 FINAL  Final  Blood Culture (routine x 2)     Status: None   Collection Time: 10/29/21 12:12 PM   Specimen: Right Antecubital; Blood  Result Value Ref Range Status   Specimen Description RIGHT ANTECUBITAL  Final   Special Requests   Final    BOTTLES DRAWN AEROBIC AND ANAEROBIC Blood Culture adequate volume   Culture   Final    NO GROWTH 5 DAYS Performed at [facility], [address] [place], [place] [address]    Report Status 11/03/2021 FINAL  Final  Resp Panel by RT-PCR (Flu A&B, Covid) Nasopharyngeal Swab     Status: None   Collection Time: 10/29/21 12:12 PM   Specimen: Nasopharyngeal Swab; Nasopharyngeal(NP) swabs in vial transport medium  Result Value Ref Range Status   SARS Coronavirus 2 by RT PCR NEGATIVE NEGATIVE Final    Comment: (NOTE) SARS-CoV-2 target nucleic acids are NOT DETECTED.  The SARS-CoV-2 RNA is generally detectable in upper respiratory specimens during the acute phase of infection. The lowest concentration of SARS-CoV-2 viral copies this assay can detect is 138 copies/mL. A negative result does not preclude SARS-Cov-2 infection and should not be used as the sole basis for treatment or other patient management decisions. A negative result may occur with  improper specimen collection/handling, submission of specimen other than nasopharyngeal swab, presence of viral mutation(s) within the areas targeted by this assay, and inadequate number of viral copies(<138 copies/mL). A negative result must be combined with clinical observations, patient history, and epidemiological information. The expected result is Negative.  Fact Sheet for Patients:  EntrepreneurPulse.com.au  Fact Sheet for Healthcare Providers:  IncredibleEmployment.be  This test is no t yet approved or cleared by the [place] FDA and  has been authorized for detection and/or diagnosis of SARS-CoV-2 by FDA under an Emergency Use Authorization  (EUA). This EUA will remain  in effect (meaning this test can be used) for the duration of the COVID-19 declaration under Section 564(b)(1) of the Act, 21 U.S.C.section 360bbb-3(b)(1), unless the authorization is terminated  or revoked sooner.       Influenza A by PCR NEGATIVE NEGATIVE Final   Influenza B by PCR NEGATIVE NEGATIVE Final    Comment: (NOTE) The Xpert Xpress SARS-CoV-2/FLU/RSV plus assay is intended as an aid in the diagnosis of influenza from Nasopharyngeal swab specimens and should not be used as a sole basis for treatment. Nasal washings and aspirates are unacceptable for Xpert Xpress SARS-CoV-2/FLU/RSV testing.  Fact Sheet for Patients: EntrepreneurPulse.com.au  Fact Sheet for Healthcare Providers: IncredibleEmployment.be  This test is not yet approved or cleared by the [place] FDA and has been authorized for detection and/or diagnosis of SARS-CoV-2 by FDA under an Emergency Use Authorization (EUA). This EUA will remain in effect (meaning this test can be used)  for the duration of the COVID-19 declaration under Section 564(b)(1) of the Act, 21 U.S.C. section 360bbb-3(b)(1), unless the authorization is terminated or revoked.  Performed at [facility], [address], [place], [place] [address]   Urine Culture     Status: None   Collection Time: 10/29/21  2:54 PM   Specimen: Urine, Catheterized  Result Value Ref Range Status   Specimen Description   Final    URINE, CATHETERIZED Performed at [facility], [address] [place], [place] [address]    Special Requests   Final    NONE Performed at [facility], [address] [place], [place] [address]    Culture   Final    NO GROWTH Performed at [facility], [place] [address] [place], [place] [address]    Report Status 10/30/2021 FINAL  Final  MRSA Next Gen by PCR, Nasal     Status: None   Collection Time: 10/29/21  7:44 PM   Specimen: Nasal Mucosa; Nasal  Swab  Result Value Ref Range Status   MRSA by PCR Next Gen NOT DETECTED NOT DETECTED Final    Comment: (NOTE) The GeneXpert MRSA Assay (FDA approved for NASAL specimens only), is one component of a comprehensive MRSA colonization surveillance program. It is not intended to diagnose MRSA infection nor to guide or monitor treatment for MRSA infections. Test performance is not FDA approved in patients less than 62 years old. Performed at [facility], [address] [place],  [address]      Labs: BNP (last 3 results) Recent Labs    10/29/21 1212  BNP 354.5*   Basic Metabolic Panel: Recent Labs  Lab 10/29/21 1212 10/30/21 0409 10/31/21 0459 11/02/21 0518  NA 134* 136 137 138  K 3.5 3.2* 3.3* 3.9  CL 99 103 104 106  CO2 21* 24 25 27   GLUCOSE 128* 110* 101* 104*  BUN 28* 22 19 19   CREATININE 1.90* 0.90 0.73 0.65  CALCIUM 8.5* 8.0* 8.3* 8.2*  MG  --  1.4* 2.0  --    Liver Function Tests: Recent Labs  Lab 10/29/21 1212 10/30/21 0409  AST 21 15  ALT 20 14  ALKPHOS 111 87  BILITOT 1.4* 0.6  PROT 6.7 5.2*  ALBUMIN 3.2* 2.4*   No results for input(s): LIPASE, AMYLASE in the last 168 hours. No results for input(s): AMMONIA in the last 168 hours. CBC: Recent Labs  Lab 10/29/21 1212 10/30/21 0409 10/31/21 0459 11/01/21 0405 11/02/21 1246  WBC 23.9* 21.2* 22.8* 13.8* 12.4*  NEUTROABS 22.5*  --   --   --   --   HGB 15.0 11.8* 12.2* 11.9* 13.4  HCT 43.1 34.1* 36.4* 34.7* 40.3  MCV 98.0 96.3 97.8 97.5 98.1  PLT 397 341 410* 400 455*   Cardiac Enzymes: No results for input(s): CKTOTAL, CKMB, CKMBINDEX, TROPONINI in the last 168 hours. BNP: Invalid input(s): POCBNP CBG: No results for input(s): GLUCAP in the last 168 hours. D-Dimer No results for input(s): DDIMER in the last 72 hours. Hgb A1c No results for input(s): HGBA1C in the last 72 hours. Lipid Profile No results for input(s): CHOL, HDL, LDLCALC, TRIG, CHOLHDL, LDLDIRECT in the last 72  hours. Thyroid function studies No results for input(s): TSH, T4TOTAL, T3FREE, THYROIDAB in the last 72 hours.  Invalid input(s): FREET3 Anemia work up No results for input(s): VITAMINB12, FOLATE, FERRITIN, TIBC, IRON, RETICCTPCT in the last 72 hours. Urinalysis    Component Value Date/Time   COLORURINE YELLOW  10/29/2021 [place] 10/29/2021 1454   LABSPEC 1.004 (L) 10/29/2021 1454   PHURINE 7.0 10/29/2021 1454   GLUCOSEU NEGATIVE 10/29/2021 1454   HGBUR NEGATIVE 10/29/2021 [place] 10/29/2021 1454   KETONESUR NEGATIVE 10/29/2021 1454   PROTEINUR NEGATIVE 10/29/2021 1454   UROBILINOGEN 0.2 03/20/2015 0730   NITRITE NEGATIVE 10/29/2021 1454   LEUKOCYTESUR NEGATIVE 10/29/2021 1454   Sepsis Labs Invalid input(s): PROCALCITONIN,  WBC,  LACTICIDVEN Microbiology Recent Results (from the past 240 hour(s))  Blood Culture (routine x 2)     Status: None   Collection Time: 10/29/21 12:12 PM   Specimen: Left Antecubital; Blood  Result Value Ref Range Status   Specimen Description LEFT ANTECUBITAL  Final   Special Requests   Final    BOTTLES DRAWN AEROBIC AND ANAEROBIC Blood Culture adequate volume   Culture   Final    NO GROWTH 5 DAYS Performed at [facility], [address], [place], [place] [address]    Report Status 11/03/2021 FINAL  Final  Blood Culture (routine x 2)     Status: None   Collection Time: 10/29/21 12:12 PM   Specimen: Right Antecubital; Blood  Result Value Ref Range Status   Specimen Description RIGHT ANTECUBITAL  Final   Special Requests   Final    BOTTLES DRAWN AEROBIC AND ANAEROBIC Blood Culture adequate volume   Culture   Final    NO GROWTH 5 DAYS Performed at [facility], [address] [place], [place] [address]    Report Status 11/03/2021 FINAL  Final  Resp Panel by RT-PCR (Flu A&B, Covid) Nasopharyngeal Swab     Status: None   Collection Time: 10/29/21 12:12 PM   Specimen: Nasopharyngeal Swab; Nasopharyngeal(NP)  swabs in vial transport medium  Result Value Ref Range Status   SARS Coronavirus 2 by RT PCR NEGATIVE NEGATIVE Final    Comment: (NOTE) SARS-CoV-2 target nucleic acids are NOT DETECTED.  The SARS-CoV-2 RNA is generally detectable in upper respiratory specimens during the acute phase of infection. The lowest concentration of SARS-CoV-2 viral copies this assay can detect is 138 copies/mL. A negative result does not preclude SARS-Cov-2 infection and should not be used as the sole basis for treatment or other patient management decisions. A negative result may occur with  improper specimen collection/handling, submission of specimen other than nasopharyngeal swab, presence of viral mutation(s) within the areas targeted by this assay, and inadequate number of viral copies(<138 copies/mL). A negative result must be combined with clinical observations, patient history, and epidemiological information. The expected result is Negative.  Fact Sheet for Patients:  EntrepreneurPulse.com.au  Fact Sheet for Healthcare Providers:  IncredibleEmployment.be  This test is no t yet approved or cleared by the [place] FDA and  has been authorized for detection and/or diagnosis of SARS-CoV-2 by FDA under an Emergency Use Authorization (EUA). This EUA will remain  in effect (meaning this test can be used) for the duration of the COVID-19 declaration under Section 564(b)(1) of the Act, 21 U.S.C.section 360bbb-3(b)(1), unless the authorization is terminated  or revoked sooner.       Influenza A by PCR NEGATIVE NEGATIVE Final   Influenza B by PCR NEGATIVE NEGATIVE Final    Comment: (NOTE) The Xpert Xpress SARS-CoV-2/FLU/RSV plus assay is intended as an aid in the diagnosis of influenza from Nasopharyngeal swab specimens and should not be used as a sole basis for treatment. Nasal washings and aspirates are unacceptable for Xpert Xpress  SARS-CoV-2/FLU/RSV testing.  Fact Sheet for Patients: EntrepreneurPulse.com.au  Fact Sheet for Healthcare Providers: IncredibleEmployment.be  This test is not yet approved or cleared by the [place] FDA and has been authorized for detection and/or diagnosis of SARS-CoV-2 by FDA under an Emergency Use Authorization (EUA). This EUA will remain in effect (meaning this test can be used) for the duration of the COVID-19 declaration under Section 564(b)(1) of the Act, 21 U.S.C. section 360bbb-3(b)(1), unless the authorization is terminated or revoked.  Performed at [facility], [address], [place], [place] [address]   Urine Culture     Status: None   Collection Time: 10/29/21  2:54 PM   Specimen: Urine, Catheterized  Result Value Ref Range Status   Specimen Description   Final    URINE, CATHETERIZED Performed at [facility], [address] [place], [place] [address]    Special Requests   Final    NONE Performed at [facility], [address] [place], [place] [address]    Culture   Final    NO GROWTH Performed at [facility], [place] [address], [place], [place] [address]    Report Status 10/30/2021 FINAL  Final  MRSA Next Gen by PCR, Nasal     Status: None   Collection Time: 10/29/21  7:44 PM   Specimen: Nasal Mucosa; Nasal Swab  Result Value Ref Range Status   MRSA by PCR Next Gen NOT DETECTED NOT DETECTED Final    Comment: (NOTE) The GeneXpert MRSA Assay (FDA approved for NASAL specimens only), is one component of a comprehensive MRSA colonization surveillance program. It is not intended to diagnose MRSA infection nor to guide or monitor treatment for MRSA infections. Test performance is not FDA approved in patients less than 50 years old. Performed at [facility], [address], [place],  [address]      Time coordinating discharge: 35 minutes  SIGNED:   Cordelia Poche, MD [facility] 11/03/2021,  11:56 AM

## 2021-11-03 NOTE — Progress Notes (Signed)
Physical Therapy Treatment Patient Details Name: Roberto Sanchez. MRN: [id number] DOB: 1946/06/21 Today's Date: 11/03/2021   History of Present Illness Roberto Arts. is a 75 y.o. male with medical history significant for dyslipidemia, hypertension, GERD, asthma, and anxiety who was brought in by his wife to the ED for evaluation of left-sided chest pain as well as some shortness of breath that began approximately 2-3 days ago.  This has progressed with worsening pain as well as cough that has been nonproductive.  He is also complained of fever at home with temperature up to 100.5 F.  No chills noted.  He has been taking his other medications regularly and denies any sick contacts.  He apparently tested at home for COVID and was noted to be negative.  He tried taking ibuprofen with no improvement.    PT Comments    Patient demonstrates increased endurance/distance for ambulaiton with slightly labored cadence, no loss of balance, limited mostly due to fatigue and ambulated on room air with SpO2 at 97%.  Patient requested to go back to bed after therapy due to fatigue after sitting up earlier in AM.  Patient will benefit from continued skilled physical therapy in hospital and recommended venue below to increase strength, balance, endurance for safe ADLs and gait.    Recommendations for follow up therapy are one component of a multi-disciplinary discharge planning process, led by the attending physician.  Recommendations may be updated based on patient status, additional functional criteria and insurance authorization.  Follow Up Recommendations  Home health PT     Assistance Recommended at Discharge Intermittent Supervision/Assistance  Equipment Recommendations       Recommendations for Other Services       Precautions / Restrictions Precautions Precautions: Fall Restrictions Weight Bearing Restrictions: No     Mobility  Bed Mobility Overal bed mobility: Modified  Independent             General bed mobility comments: slightly labored movement    Transfers Overall transfer level: Needs assistance Equipment used: Rolling Mcfarland (2 wheels) Transfers: Sit to/from Stand;Bed to chair/wheelchair/BSC Sit to Stand: Supervision     Step pivot transfers: Supervision     General transfer comment: required repeated attempts before able to complete sit to stand from bedside with Supervision    Ambulation/Gait Ambulation/Gait assistance: Supervision Gait Distance (Feet): 65 Feet Assistive device: Rolling Pozzi (2 wheels) Gait Pattern/deviations: Decreased step length - right;Decreased step length - left;Decreased stride length Gait velocity: decreased     General Gait Details: demonstrates increased endurance/distance for ambulaiton with slightly labored cadence, no loss of balance, limited mostly due to fatigue, on room air with SpO2 at 97%   Stairs             Wheelchair Mobility    Modified Rankin (Stroke Patients Only)       Balance Overall balance assessment: Needs assistance Sitting-balance support: Feet supported;No upper extremity supported Sitting balance-Leahy Scale: Good Sitting balance - Comments: seated at EOB   Standing balance support: During functional activity;Bilateral upper extremity supported Standing balance-Leahy Scale: Fair Standing balance comment: fair/good using RW                            Cognition Arousal/Alertness: Awake/alert Behavior During Therapy: WFL for tasks assessed/performed Overall Cognitive Status: Within Functional Limits for tasks assessed  Exercises General Exercises - Lower Extremity Long Arc Quad: Seated;AROM;Strengthening;Both;10 reps Hip Flexion/Marching: Seated;AROM;Strengthening;Both;10 reps Toe Raises: Seated;AROM;Strengthening;Both;10 reps Heel Raises: Seated;AROM;Strengthening;Both;10 reps     General Comments        Pertinent Vitals/Pain Pain Assessment: No/denies pain    Home Living                          Prior Function            PT Goals (current goals can now be found in the care plan section) Acute Rehab PT Goals Patient Stated Goal: return home with family to assist PT Goal Formulation: With patient/family Time For Goal Achievement: 11/05/21 Potential to Achieve Goals: Good Progress towards PT goals: Progressing toward goals    Frequency    Min 3X/week      PT Plan Current plan remains appropriate    Co-evaluation              AM-PAC PT "6 Clicks" Mobility   Outcome Measure  Help needed turning from your back to your side while in a flat bed without using bedrails?: None Help needed moving from lying on your back to sitting on the side of a flat bed without using bedrails?: None Help needed moving to and from a bed to a chair (including a wheelchair)?: None Help needed standing up from a chair using your arms (e.g., wheelchair or bedside chair)?: A Little Help needed to walk in hospital room?: A Little Help needed climbing 3-5 steps with a railing? : A Little 6 Click Score: 21    End of Session   Activity Tolerance: Patient tolerated treatment well;Patient limited by fatigue Patient left: in bed;with call bell/phone within reach;with family/visitor present Nurse Communication: Mobility status PT Visit Diagnosis: Unsteadiness on feet (R26.81);Other abnormalities of gait and mobility (R26.89);Muscle weakness (generalized) (M62.81)     Time: 6579-0383 PT Time Calculation (min) (ACUTE ONLY): 27 min  Charges:  $Gait Training: 8-22 mins $Therapeutic Exercise: 8-22 mins                     10:01 AM, 11/03/21 Lonell Grandchild, MPT Physical Therapist with [facility] [phone] office [phone] mobile phone

## 2021-11-03 NOTE — Progress Notes (Signed)
Patient ambulated with standby assist. Patient's oxygen level was 97% with ambulation.

## 2021-11-03 NOTE — TOC Transition Note (Signed)
Transition of Care [facility]) - CM/SW Discharge Note   Patient Details  Name: Roberto Sanchez. MRN: [id number] Date of Birth: 12-01-1945  [facility] CM/SW Contact:  Natasha Bence, LCSW Phone Number: 11/03/2021, 1:30 PM   Clinical Narrative:    CSW notified of patient's readiness for discharge. CSW notified Corene Cornea with [facility]. Corene Cornea agreeable to provide [facility] services upon discharge. TOC signing off.   Final next level of care: [place] Barriers to Discharge: Barriers Resolved   Patient Goals and CMS Choice Patient states their goals for this hospitalization and ongoing recovery are:: Return home with [facility] CMS Medicare.gov Compare Post Acute Care list provided to:: Patient Choice offered to / list presented to : Patient  Discharge Placement                    Patient and family notified of of transfer: 11/03/21  Discharge Plan and Services In-house Referral: Clinical Social Work Discharge Planning Services: CM Consult Post Acute Care Choice: [place]: PT [place]: [place] ([place]) Date HH Agency Contacted: 11/03/21 Time [place]: 1330 Representative spoke with at [place]: [place] ([place]) Interventions     Readmission Risk Interventions Readmission Risk Prevention Plan 11/02/2021  Transportation Screening Complete  HRI or Dent Complete  Social Work Consult for [place] Planning/Counseling Complete  Palliative Care Screening Not Applicable  Medication Review Press photographer) Complete  Some recent data might be hidden

## 2021-11-22 ENCOUNTER — Encounter: Payer: Self-pay | Admitting: Student

## 2021-11-22 ENCOUNTER — Ambulatory Visit (INDEPENDENT_AMBULATORY_CARE_PROVIDER_SITE_OTHER): Payer: Medicare Other | Admitting: Student

## 2021-11-22 ENCOUNTER — Other Ambulatory Visit: Payer: Self-pay

## 2021-11-22 VITALS — BP 138/80 | HR 77 | Resp 20 | Ht 70.0 in | Wt 203.4 lb

## 2021-11-22 DIAGNOSIS — I10 Essential (primary) hypertension: Secondary | ICD-10-CM | POA: Diagnosis not present

## 2021-11-22 DIAGNOSIS — J45909 Unspecified asthma, uncomplicated: Secondary | ICD-10-CM | POA: Diagnosis not present

## 2021-11-22 DIAGNOSIS — I480 Paroxysmal atrial fibrillation: Secondary | ICD-10-CM | POA: Diagnosis not present

## 2021-11-22 MED ORDER — APIXABAN 5 MG PO TABS: 5.0000 mg | ORAL_TABLET | Freq: Two times a day (BID) | ORAL | 3 refills | Status: DC

## 2021-11-22 MED ORDER — DILTIAZEM HCL ER COATED BEADS 120 MG PO CP24: 120.0000 mg | ORAL_CAPSULE | Freq: Every day | ORAL | 3 refills | Status: DC

## 2021-11-22 NOTE — Progress Notes (Signed)
Cardiology Office Note    Date:  11/22/2021   ID:  Roberto Sing., DOB 10-Aug-1946, MRN [id number]  PCP:  [name]  Cardiologist: Carlyle Dolly, MD    Chief Complaint  Patient presents with   Hospitalization Follow-up    History of Present Illness:    Roberto Sanchez. is a 75 y.o. male with past medical history of asthma, HTN, GERD and lung nodules who presents to the office today for hospital follow-up.   He was most recently admitted to [facility] on 10/29/2021 for sepsis in the setting of PNA. Cardiology was consulted as he was found to be in atrial fibrillation with RVR during admission and was started on IV Cardizem and converted back to NSR with this. Echo showed a preserved EF of 60-65% with no regional WMA and no significant valve abnormalities. He was started on Eliquis 5mg  BID for anticoagulation along with Cardizem CD 120mg  daily for rate-control.   In talking the patient today, he reports he was initially weak upon returning home but has been working with physical therapy and was released yesterday. Has been walking outside for exercise and says that his dyspnea continues to improve. No recent chest pain or palpitations. Was overall unaware of his arrhythmia during admission. No recent orthopnea, PND or pitting edema.  He reports good compliance with Eliquis and denies any melena, hematochezia or hematuria.   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[place])    Rupture of right triceps tendon 09/01/2015    Past Surgical History:  Procedure Laterality Date   BALLOON DILATION N/A 06/26/2021   Procedure: BALLOON DILATION;  Surgeon: Eloise Harman, DO;  [facility];  Service: Endoscopy;  Laterality: N/A;   COLONOSCOPY WITH PROPOFOL N/A 07/14/2020   diverticulosis, 5 polyps (the largest of which was 14 mm in the cecum) and  recommended repeat in 1 year (tubular adenomas).    COLONOSCOPY WITH PROPOFOL N/A 06/26/2021   fair prep, non-bleeding internal hemorrhoids, sigmoid diverticulosis, two 4-7 mm polyps in cecum, one 2 mmp olyp in cecum. 3 year surveillance. Tubular adenoma.   ESOPHAGOGASTRODUODENOSCOPY (EGD) WITH PROPOFOL N/A 06/26/2021   moderate Shatzki ring, small hiatal hernia, gastritis s/p biopsy, normal duodenum. Negative H.pylori.   KNEE ARTHROPLASTY Left 10/2020   KNEE ARTHROSCOPY Right 1980s   NASAL SINUS SURGERY     POLYPECTOMY  07/14/2020   Procedure: POLYPECTOMY;  Surgeon: Eloise Harman, DO;  [facility];  Service: Endoscopy;;   POLYPECTOMY  06/26/2021   Procedure: POLYPECTOMY;  Surgeon: Eloise Harman, DO;  [facility];  Service: Endoscopy;;   PROSTATE SURGERY     TONSILLECTOMY  age 30   TRICEPS TENDON REPAIR Right 09/01/2015   Procedure: RIGHT TRICEPS  REPAIR;  Surgeon: Marchia Bond, MD;  Location: [facility];  Service: Orthopedics;  Laterality: Right;    Current Medications: Outpatient Medications Prior to Visit  Medication Sig Dispense Refill   albuterol (VENTOLIN HFA) 108 (90 Base) MCG/ACT inhaler Inhale 2 puffs into the lungs every 6 (six) hours as needed for wheezing or shortness of breath.     ALPRAZolam (XANAX) 0.5 MG tablet Take 0.5 mg by mouth at bedtime.     Calcium Carb-Cholecalciferol (CALCIUM 600 + D PO) Take 1 tablet by mouth daily.  cetirizine (ZYRTEC) 10 MG tablet Take 10 mg by mouth daily.     chlorhexidine (PERIDEX) 0.12 % solution Use as directed 15 mLs in the mouth or throat 2 (two) times daily.     diclofenac Sodium (VOLTAREN) 1 % GEL Apply 4 g topically 4 (four) times daily.     DULoxetine (CYMBALTA) 20 MG capsule Take 20 mg by mouth at bedtime.     ezetimibe (ZETIA) 10 MG tablet Take 10 mg by mouth daily.     fluticasone (FLONASE) 50 MCG/ACT nasal spray Place 1 spray into both nostrils daily as needed for allergies or  rhinitis.     Fluticasone-Salmeterol (ADVAIR) 250-50 MCG/DOSE AEPB Inhale 1 puff into the lungs every 12 (twelve) hours.     guaifenesin (HUMIBID E) 400 MG TABS tablet Take 400 mg by mouth daily as needed (congestion).     hydroxypropyl methylcellulose / hypromellose (ISOPTO TEARS / GONIOVISC) 2.5 % ophthalmic solution Place 1 drop into both eyes as needed for dry eyes.      ipratropium-albuterol (DUONEB) 0.5-2.5 (3) MG/3ML SOLN Take 3 mLs by nebulization every 6 (six) hours as needed. 360 mL 0   montelukast (SINGULAIR) 10 MG tablet Take 10 mg by mouth daily.     Multiple Vitamin (MULTIVITAMIN WITH MINERALS) TABS tablet Take 1 tablet by mouth daily.     niacin 500 MG tablet Take 500 mg by mouth daily.      Omega-3 Fatty Acids (FISH OIL) 1000 MG CAPS Take 1,000 mg by mouth daily.      omeprazole (PRILOSEC) 20 MG capsule Take 1 capsule (20 mg total) by mouth 2 (two) times daily before a meal. (Patient taking differently: Take 20 mg by mouth daily.) 60 capsule 5   apixaban (ELIQUIS) 5 MG TABS tablet Take 1 tablet (5 mg total) by mouth 2 (two) times daily. 60 tablet 2   diltiazem (CARDIZEM CD) 120 MG 24 hr capsule Take 1 capsule (120 mg total) by mouth daily. 30 capsule 2   No facility-administered medications prior to visit.     Allergies:   Penicillins, Shellfish allergy, Atorvastatin, Simvastatin, and Codeine   Social History   Socioeconomic History   Marital status: Married    Spouse name: Not on file   Number of children: Not on file   Years of education: Not on file   Highest education level: Not on file  Occupational History   Not on file  Tobacco Use   Smoking status: Former    Packs/day: 3.00    Years: 20.00    Pack years: 60.00    Types: Cigarettes    Quit date: 08/30/1983    Years since quitting: 38.2   Smokeless tobacco: Former    Types: Chew    Quit date: 08/29/1985  Vaping Use   Vaping Use: Never used  Substance and Sexual Activity   Alcohol use: Yes    Comment: 3  to 5 beers weekly   Drug use: No   Sexual activity: Not on file  Other Topics Concern   Not on file  Social History Narrative   Not on file   Social Determinants of Health   Financial Resource Strain: Not on file  Food Insecurity: Not on file  Transportation Needs: Not on file  Physical Activity: Not on file  Stress: Not on file  Social Connections: Not on file     Family History:  The patient's family history includes Allergic rhinitis in his brother and brother; Asthma in his  maternal uncle; Colon cancer (age of onset: 61) in his father.   Review of Systems:    Please see the history of present illness.     All other systems reviewed and are otherwise negative except as noted above.   Physical Exam:    VS:  BP 138/80 (BP Location: Left Arm, Patient Position: Sitting, Cuff Size: Normal)    Pulse 77    Resp 20    Ht 5\' 10"  (1.778 m)    Wt 203 lb 6.4 oz (92.3 kg)    SpO2 96%    BMI 29.18 kg/m    General: Well developed, well nourished,male appearing in no acute distress. Head: Normocephalic, atraumatic. Neck: No carotid bruits. JVD not elevated.  Lungs: Respirations regular and unlabored, without wheezes or rales.  Heart: Regular rate and rhythm. No S3 or S4.  No murmur, no rubs, or gallops appreciated. Abdomen: Appears non-distended. No obvious abdominal masses. Msk:  Strength and tone appear normal for age. No obvious joint deformities or effusions. Extremities: No clubbing or cyanosis. No pitting edema.  Distal pedal pulses are 2+ bilaterally. Neuro: Alert and oriented X 3. Moves all extremities spontaneously. No focal deficits noted. Psych:  Responds to questions appropriately with a normal affect. Skin: No rashes or lesions noted  Wt Readings from Last 3 Encounters:  11/22/21 203 lb 6.4 oz (92.3 kg)  11/01/21 206 lb 9.1 oz (93.7 kg)  09/25/21 208 lb (94.3 kg)     Studies/Labs Reviewed:   EKG:  EKG is not ordered today.    Recent Labs: 03/18/2021: TSH  1.705 10/29/2021: B Natriuretic Peptide 142.0 10/30/2021: ALT 14 10/31/2021: Magnesium 2.0 11/02/2021: BUN 19; Creatinine, Ser 0.65; Hemoglobin 13.4; Platelets 455; Potassium 3.9; Sodium 138   Lipid Panel No results found for: CHOL, TRIG, HDL, CHOLHDL, VLDL, LDLCALC, LDLDIRECT  Additional studies/ records that were reviewed today include:   Echocardiogram: 10/31/2021 IMPRESSIONS     1. Left ventricular ejection fraction, by estimation, is 60 to 65%. The  left ventricle has normal function. The left ventricle has no regional  wall motion abnormalities. There is mild left ventricular hypertrophy.  Left ventricular diastolic parameters  were normal.   2. Right ventricular systolic function is normal. The right ventricular  size is normal. There is normal pulmonary artery systolic pressure.   3. The mitral valve is normal in structure. Trivial mitral valve  regurgitation. No evidence of mitral stenosis.   4. The aortic valve was not well visualized. There is mild calcification  of the aortic valve. There is mild thickening of the aortic valve. Aortic  valve regurgitation is not visualized. No aortic stenosis is present.   5. The inferior vena cava is normal in size with greater than 50%  respiratory variability, suggesting right atrial pressure of 3 mmHg.   Assessment:    1. PAF (paroxysmal atrial fibrillation) ([place])   2. Essential hypertension   3. Chronic asthma without complication, unspecified asthma severity, unspecified whether persistent      Plan:   In order of problems listed above:  1. Paroxysmal Atrial Fibrillation - Diagnosed during his recent admission for sepsis in the setting of PNA and converted back to NSR with IV Cardizem at that time. He denies any recent palpitations and HR has been well-controlled when checked at home.  - Given he was just diagnosed with the arrhythmia earlier this month, will continue anticoagulation with Eliquis 5mg  BID. Will arrange for  follow-up in several months and if no palpitations or  episodes of elevated HR in the interim, will order a monitor at that time as previously mentioned by Dr. Harl Bowie and can possibly discontinue anticoagulation if no recurrence. I encouraged him to make [place] aware of any symptoms or elevated HR in the interim (he checks his pulse ox daily). Will continue Cardizem CD 120mg  daily.   2. HTN - His BP is at 138/80 during today's visit. He is taking Cardizem CD but is unsure if he stopped or has continued on HCTZ and Losartan as both were discontinued at the time of recent hospital discharge. He will call the office back to make [place] aware of his current regimen.   3. Asthma - He reports a history of asthma along with Agent Orange exposure. He has scheduled follow-up with [facility] in 12/2021. Remains on Singulair and Advair.    Medication Adjustments/Labs and Tests Ordered: Current medicines are reviewed at length with the patient today.  Concerns regarding medicines are outlined above.  Medication changes, Labs and Tests ordered today are listed in the Patient Instructions below. Patient Instructions  Please call back and let us know if you are taking Losartan (Cozaar) or HCTZ (Hydrochlorothiazide).      Medication Instructions:  Your physician recommends that you continue on your current medications as directed. Please refer to the Current Medication list given to you today.   Labwork: None   Testing/Procedures: None   Follow-Up:  27months  Any Other Special Instructions Will Be Listed Below (If Applicable).  If you need a refill on your cardiac medications before your next appointment, please call your pharmacy.    Signed, Erma Heritage, PA-C  11/22/2021 7:30 PM    [facility] [address] [place], [place] [address] Phone: [phone] Fax: [phone]

## 2021-11-22 NOTE — Patient Instructions (Signed)
Please call back and let us know if you are taking Losartan (Cozaar) or HCTZ (Hydrochlorothiazide).      Medication Instructions:  Your physician recommends that you continue on your current medications as directed. Please refer to the Current Medication list given to you today.   Labwork: None   Testing/Procedures: None   Follow-Up:  32months  Any Other Special Instructions Will Be Listed Below (If Applicable).  If you need a refill on your cardiac medications before your next appointment, please call your pharmacy.

## 2021-11-27 ENCOUNTER — Telehealth: Payer: Self-pay | Admitting: Student

## 2021-11-27 NOTE — Telephone Encounter (Signed)
New Message:     Patient said he saw [place] last week. He wanted her to know that he does not take Losartan and Chlorothiazide.

## 2021-11-27 NOTE — Telephone Encounter (Signed)
Spoke with pt and informed that he is no longer taking Losartan and HCTZ.

## 2021-11-27 NOTE — Telephone Encounter (Signed)
° ° °  Thank you for the update. I would just have him follow his blood pressure readings at home if able and report back if this remains elevated as he may need to restart one of the two going forward pending his readings.  Signed, Erma Heritage, PA-C 11/27/2021, 4:05 PM

## 2021-11-27 NOTE — Telephone Encounter (Signed)
Spoke to pt who verbalized understanding and agreement to follow bp's at home. Pt had no questions or concerns at this time.

## 2021-12-11 ENCOUNTER — Telehealth: Payer: Self-pay | Admitting: Cardiology

## 2021-12-11 NOTE — Telephone Encounter (Signed)
Pt c/o medication issue:  1. Name of Medication: losartan (COZAAR) 100 MG tablet   2. How are you currently taking this medication (dosage and times per day)?  Take 100 mg by mouth at bedtime.  3. Are you having a reaction (difficulty breathing--STAT)? no  4. What is your medication issue? Pt reaching out to inform us that he is started back taking this medication to assist with his blood pressure and it is working

## 2021-12-11 NOTE — Telephone Encounter (Signed)
Pt notified. Medication list updated to reflect medication changes.

## 2021-12-11 NOTE — Telephone Encounter (Signed)
Noted. Will fwd to provider as Juluis Rainier

## 2022-01-11 ENCOUNTER — Ambulatory Visit (HOSPITAL_COMMUNITY)
Admission: RE | Admit: 2022-01-11 | Discharge: 2022-01-11 | Disposition: A | Discharge status: Home / Self Care | Payer: Medicare Other | Source: Ambulatory Visit | Attending: Pulmonary Disease | Admitting: Pulmonary Disease

## 2022-01-11 ENCOUNTER — Other Ambulatory Visit: Payer: Self-pay

## 2022-01-11 DIAGNOSIS — R911 Solitary pulmonary nodule: Secondary | ICD-10-CM | POA: Insufficient documentation

## 2022-01-16 ENCOUNTER — Other Ambulatory Visit: Payer: Self-pay

## 2022-01-16 ENCOUNTER — Encounter: Payer: Self-pay | Admitting: Acute Care

## 2022-01-16 ENCOUNTER — Ambulatory Visit (INDEPENDENT_AMBULATORY_CARE_PROVIDER_SITE_OTHER): Payer: No Typology Code available for payment source | Admitting: Acute Care

## 2022-01-16 VITALS — BP 150/80 | HR 68 | Ht 70.0 in | Wt 204.2 lb

## 2022-01-16 DIAGNOSIS — I482 Chronic atrial fibrillation, unspecified: Secondary | ICD-10-CM | POA: Diagnosis not present

## 2022-01-16 DIAGNOSIS — R911 Solitary pulmonary nodule: Secondary | ICD-10-CM | POA: Diagnosis not present

## 2022-01-16 DIAGNOSIS — I10 Essential (primary) hypertension: Secondary | ICD-10-CM

## 2022-01-16 DIAGNOSIS — J45909 Unspecified asthma, uncomplicated: Secondary | ICD-10-CM | POA: Diagnosis not present

## 2022-01-16 MED ORDER — APIXABAN 5 MG PO TABS: ORAL_TABLET | ORAL | 0 refills | Status: DC

## 2022-01-16 NOTE — Patient Instructions (Addendum)
It is good to see you today. We will do a repeat CT Chest in 6 months to re-evaluate the Left lower lobe nodule.  Continue Advair/ Flonase/Singulair  as asthma maintenance Continue Duonebs and Albuterol  as rescue,  as needed for shortness of breath or wheezing.  We will send in a prescription for your Eliquis . Take 5 mg tonight and 5 mg in the morning. This has gone to [facility].  Pick up your prescription from the [place] tomorrow and start this tomorrow night.  Follow up with Judson Roch NP or Dr. Valeta Harms after CT chest in 6 months.  [facility], as you may need a dose adjustment to better control your blood pressure.  Call us sooner if you need [place] sooner.  Please contact office for sooner follow up if symptoms do not improve or worsen or seek emergency care

## 2022-01-16 NOTE — Progress Notes (Signed)
History of Present Illness Roberto Sanchez. is a 76 y.o. male Former smoker  ( Quit 1984 a 60+ pack year smoking history.) Had Covid  07/2021, and had a CT Chest with finding of an 8 mm nodule. Followed by Dr. Valeta Harms  Synopsis PMH asthma, gerd,htn, asthma, on wixela inhaler, well controlled and rarely ueses albuterol. He did have covid 4 weeks and has recovered.  From respiratory standpoint he is doing okay.  He still has a cough at times.  He states his back hurts that this afternoon after mowing his grass this morning but otherwise is able to complete all of his activities of daily living.  He has CT scan in August of this past year which revealed a 8 mm pulmonary nodule.  He has a few other smaller pulmonary nodular densities within the chest.  He smoked for many years at 3 packs/day.  He smoked for 20 years total, 60+ pack year history and quit 1984.   01/16/2022 Pt. Presents for follow up. He had a CT chest to re-evaluate the pulmonary nodule seen on CT Chest 06/2021. The CT Chest done 01/14/2021 shows interval resolution of the airspace process / pneumonia in the left upper lobe. He does have some post pneumonic atelectasis or scarring. There is a 4 mm LLL small nodular density right at the branch point of the pulmonary artery making it difficult to measure exactly but it is approximately 4 mm in size. This was obscured on the prior CT scan. Recommendation is for a 6-12 month follow up. As he is a high risk patient with a significant smoking history, we will repeat CT Chest in 6 months.  He states he has been doing well. The cough he had after his Covid has been is totally gone. He is compliant with his Advair/ Flonase/Singulair and Duonebs and Albuterol  as needed. He has not had awakenings as a result of his asthma . He rarely uses his rescue inhaler.  He states he ran out of his Eliquis. He has been off this x 2 days. He plans to pick this up He was started on this for atrial fibrillation  10/2021. Marland Kitchen He states he has been told he is no longer in atrial fibrillation. Plan is for him to continue Eliquis   for 6 months. I have asked the patient to go and pick up his Eliquis tomorrow and I have sent in  a prescription for 2 tablets to be started tonight, and another for in the morning. I have asked him to pick up his rx from the [place] tomorrow and start that prescription tomorrow for the evening dose. We discussed that he should not skip a single dose of Eliquis.  His BP is elevated today in the office. 150/80. He is taking his BP medication . I have asked him to follow up with the [facility] clinic as he may need a dose adjustment. He verbalized understanding .  Test Results: 01/14/2022:Super D CT Chest Interval resolution of the dense airspace process/pneumonia in the left upper lobe. Some residual post pneumonic atelectasis or scarring. 4 mm left lower lobe pulmonary nodule. Recommend correlation with prior CT scan from August showing this lesion. 6-12 month follow-up noncontrast CT suggested. No mediastinal or hilar mass or adenopathy. Stable segment 8 hepatic cyst. Aortic atherosclerosis.  CBC Latest Ref Rng & Units 11/02/2021 11/01/2021 10/31/2021  WBC 4.0 - 10.5 K/uL 12.4(H) 13.8(H) 22.8(H)  Hemoglobin 13.0 - 17.0 g/dL 13.4 11.9(L) 12.2(L)  Hematocrit  39.0 - 52.0 % 40.3 34.7(L) 36.4(L)  Platelets 150 - 400 K/uL 455(H) 400 410(H)    BMP Latest Ref Rng & Units 11/02/2021 10/31/2021 10/30/2021  Glucose 70 - 99 mg/dL 104(H) 101(H) 110(H)  BUN 8 - 23 mg/dL 19 19 22   Creatinine 0.61 - 1.24 mg/dL 0.65 0.73 0.90  Sodium 135 - 145 mmol/L 138 137 136  Potassium 3.5 - 5.1 mmol/L 3.9 3.3(L) 3.2(L)  Chloride 98 - 111 mmol/L 106 104 103  CO2 22 - 32 mmol/L 27 25 24   Calcium 8.9 - 10.3 mg/dL 8.2(L) 8.3(L) 8.0(L)    BNP    Component Value Date/Time   BNP 142.0 (H) 10/29/2021 1212    ProBNP No results found for: PROBNP  PFT No results found for: FEV1PRE, FEV1POST, FVCPRE, FVCPOST,  TLC, DLCOUNC, PREFEV1FVCRT, PSTFEV1FVCRT  CT Super D Chest Wo Contrast  Result Date: 01/14/2022 CLINICAL DATA:  Followup pulmonary nodule. EXAM: CT CHEST WITHOUT CONTRAST TECHNIQUE: Multidetector CT imaging of the chest was performed using thin slice collimation for electromagnetic bronchoscopy planning purposes, without intravenous contrast. RADIATION DOSE REDUCTION: This exam was performed according to the departmental dose-optimization program which includes automated exposure control, adjustment of the mA and/or kV according to patient size and/or use of iterative reconstruction technique. COMPARISON:  CT scan 10/30/2021. Patient reportedly had a CT scan in August of 2022 which showed an 8 mm nodule. This study is not available for comparison. FINDINGS: Cardiovascular: The heart is normal in size. No pericardial effusion. The aorta is within normal limits in caliber. Scattered atherosclerotic calcifications. Stable scattered coronary artery calcifications. Mediastinum/Nodes: No mediastinal or hilar mass or lymphadenopathy. The esophagus is grossly normal. Lungs/Pleura: Interval resolution of dense airspace process/pneumonia in the left upper lobe. Some residual post pneumonic atelectasis or scarring. No new acute pulmonary process. In the superior segment of the left lower lobe on image number 70/4 there is a small nodular density right at the branch point of the pulmonary artery making it difficult to measure exactly but it is approximately 4 mm in size. This was obscured on the prior CT scan. On the prior CT scan there was a sub solid nodular density in the right upper lobe but this has resolved no was likely an area of inflammation or infection. No pleural effusions or pleural lesions. Small Bochdalek's hernia noted on left. Upper Abdomen: Stable segment 8 hepatic cyst. No adrenal lesions. No upper abdominal adenopathy. Scattered vascular calcifications. Musculoskeletal: No chest wall mass,  supraclavicular or axillary adenopathy. The bony thorax is intact. IMPRESSION: 1. Interval resolution of the dense airspace process/pneumonia in the left upper lobe. Some residual post pneumonic atelectasis or scarring. 2. 4 mm left lower lobe pulmonary nodule. Recommend correlation with prior CT scan from August showing this lesion. 6-12 month follow-up noncontrast CT suggested. 3. No mediastinal or hilar mass or adenopathy. 4. Stable segment 8 hepatic cyst. 5. Aortic atherosclerosis. Aortic Atherosclerosis (ICD10-I70.0). Electronically Signed   By: Marijo Sanes M.D.   On: 01/14/2022 13:34     Past medical hx 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[place])    Rupture of right triceps tendon 09/01/2015     Social History   Tobacco Use   Smoking status: Former    Packs/day: 3.00    Years: 20.00    Pack years: 60.00    Types: Cigarettes  Quit date: 08/30/1983    Years since quitting: 38.4   Smokeless tobacco: Former    Types: Chew    Quit date: 08/29/1985  Vaping Use   Vaping Use: Never used  Substance Use Topics   Alcohol use: Yes    Comment: 3 to 5 beers weekly   Drug use: No    Mr.Hassan reports that he quit smoking about 38 years ago. His smoking use included cigarettes. He has a 60.00 pack-year smoking history. He quit smokeless tobacco use about 36 years ago.  His smokeless tobacco use included chew. He reports current alcohol use. He reports that he does not use drugs.  Tobacco Cessation: Former smoker with a 60 + pack year smoking history   Past surgical hx, Family hx, Social hx all reviewed.  Current Outpatient Medications on File Prior to Visit  Medication Sig   albuterol (VENTOLIN HFA) 108 (90 Base) MCG/ACT inhaler Inhale 2 puffs into the lungs every 6 (six) hours as needed for wheezing or shortness of breath.   ALPRAZolam (XANAX) 0.5 MG tablet Take 0.5 mg by  mouth at bedtime.   apixaban (ELIQUIS) 5 MG TABS tablet Take 1 tablet (5 mg total) by mouth 2 (two) times daily.   Calcium Carb-Cholecalciferol (CALCIUM 600 + D PO) Take 1 tablet by mouth daily.   cetirizine (ZYRTEC) 10 MG tablet Take 10 mg by mouth daily.   chlorhexidine (PERIDEX) 0.12 % solution Use as directed 15 mLs in the mouth or throat 2 (two) times daily.   diclofenac Sodium (VOLTAREN) 1 % GEL Apply 4 g topically 4 (four) times daily.   diltiazem (CARDIZEM CD) 120 MG 24 hr capsule Take 1 capsule (120 mg total) by mouth daily.   DULoxetine (CYMBALTA) 20 MG capsule Take 20 mg by mouth at bedtime.   ezetimibe (ZETIA) 10 MG tablet Take 10 mg by mouth daily.   fluticasone (FLONASE) 50 MCG/ACT nasal spray Place 1 spray into both nostrils daily as needed for allergies or rhinitis.   Fluticasone-Salmeterol (ADVAIR) 250-50 MCG/DOSE AEPB Inhale 1 puff into the lungs every 12 (twelve) hours.   guaifenesin (HUMIBID E) 400 MG TABS tablet Take 400 mg by mouth daily as needed (congestion).   hydroxypropyl methylcellulose / hypromellose (ISOPTO TEARS / GONIOVISC) 2.5 % ophthalmic solution Place 1 drop into both eyes as needed for dry eyes.    ipratropium-albuterol (DUONEB) 0.5-2.5 (3) MG/3ML SOLN Take 3 mLs by nebulization every 6 (six) hours as needed.   losartan (COZAAR) 100 MG tablet Take 100 mg by mouth daily.   montelukast (SINGULAIR) 10 MG tablet Take 10 mg by mouth daily.   Multiple Vitamin (MULTIVITAMIN WITH MINERALS) TABS tablet Take 1 tablet by mouth daily.   niacin 500 MG tablet Take 500 mg by mouth daily.    Omega-3 Fatty Acids (FISH OIL) 1000 MG CAPS Take 1,000 mg by mouth daily.    omeprazole (PRILOSEC) 20 MG capsule Take 1 capsule (20 mg total) by mouth 2 (two) times daily before a meal. (Patient taking differently: Take 20 mg by mouth daily.)   No current facility-administered medications on file prior to visit.     Allergies  Allergen Reactions   Penicillins Other (See Comments)     Causes patient to "pass out"DID THE REACTION INVOLVE: Swelling of the face/tongue/throat, SOB, or low BP? No Sudden or severe rash/hives, skin peeling, or the inside of the mouth or nose? No Did it require medical treatment? No When did it last happen?  If all above answers are NO, may proceed with cephalosporin use.    Shellfish Allergy Hives, Shortness Of Breath and Swelling    Facial Swelling  ears turn red   Atorvastatin Other (See Comments)    Muscle pain and muscle weakness   Simvastatin     Muscle pain and muscle weakness   Codeine Nausea And Vomiting    Review Of Systems:  Constitutional:   No  weight loss, night sweats,  Fevers, chills, fatigue, or  lassitude.  HEENT:   No headaches,  Difficulty swallowing,  Tooth/dental problems, or  Sore throat,                No sneezing, itching, ear ache, nasal congestion, post nasal drip,   CV:  No chest pain,  Orthopnea, PND, swelling in lower extremities, anasarca, dizziness, palpitations, syncope.   GI  No heartburn, indigestion, abdominal pain, nausea, vomiting, diarrhea, change in bowel habits, loss of appetite, bloody stools.   Resp: No shortness of breath with exertion or at rest.  No excess mucus, no productive cough,  No non-productive cough,  No coughing up of blood.  No change in color of mucus.  No wheezing.  No chest wall deformity  Skin: no rash or lesions.  GU: no dysuria, change in color of urine, no urgency or frequency.  No flank pain, no hematuria   MS:  No joint pain or swelling.  No decreased range of motion.  No back pain.  Psych:  No change in mood or affect. No depression or anxiety.  No memory loss.   Vital Signs BP (!) 150/80 (BP Location: Left Arm, Patient Position: Sitting, Cuff Size: Normal)    Pulse 68    Ht 5\' 10"  (1.778 m)    Wt 204 lb 3.2 oz (92.6 kg)    SpO2 98%    BMI 29.30 kg/m    Physical Exam:  General- No distress,  A&Ox3, pleasant ENT: No sinus tenderness, TM clear, pale  nasal mucosa, no oral exudate,no post nasal drip, no LAN Cardiac: S1, S2, regular rate and rhythm, no murmur Chest: No wheeze/ rales/ dullness; no accessory muscle use, no nasal flaring, no sternal retractions Abd.: Soft Non-tender, ND, BS +, Body mass index is 29.3 kg/m. Ext: No clubbing cyanosis, edema Neuro:  normal strength, MAE x 4, A&O x 3 Skin: No rashes, warm and dry, no lesions Psych: normal mood and behavior   Assessment/Plan Pulmonary Nodule in former heavy smoker with a 60 + pack year smoking history Follow up CT 01/14/2022 with 4 mm nodule Plan We will do a repeat CT Chest in 6 months to re-evaluate the Left lower lobe nodule.  Continue Advair/ Flonase/Singulair  as asthma maintenance Continue Duonebs and Albuterol  as rescue,  as needed for shortness of breath or wheezing.   Follow up with Judson Roch NP or Dr. Valeta Harms after CT chest in 6 months.  [facility], as you may need a dose adjustment to better control your blood pressure.  Call us sooner if you need [place] sooner.  Please contact office for sooner follow up if symptoms do not improve or worsen or seek emergency care    Atrial Fibrillation Ran out of Eliquis  Plan We will send in a prescription for your Eliquis .( 2 tablets to get you through to when you get your RX tomorrow)  Take 5 mg tonight and 5 mg in the morning. This has gone to [facility] ,  [address].  Pick up your prescription from the [place] tomorrow and start this tomorrow night.  Never skip a dose of Eliquis.   HTN BP elevated today at visit Plan Please check with the [place] about you BP, as you may need a dose adjustment to better control your blood pressure.   I spent 40 minutes dedicated to the care of this patient on the date of this encounter to include pre-visit review of records, face-to-face time with the patient discussing conditions above, post visit ordering of testing, clinical documentation with the electronic health  record, making appropriate referrals as documented, and communicating necessary information to the patient's healthcare team.    Magdalen Spatz, NP 01/16/2022  2:45 PM

## 2022-01-16 NOTE — Progress Notes (Signed)
Roberto Sanchez,  Please let [name] know that I have reviewed his CT scan.  The area of concern has resolved.  The pneumonia is also resolved.  He has a 4 mm left lower lobe pulmonary nodule that will need a repeat CT scan of the chest in 12 months.  Okay to place orders.  Thanks,  BLI  Garner Nash, DO [facility] 01/16/2022 4:17 PM

## 2022-01-18 ENCOUNTER — Other Ambulatory Visit: Payer: Self-pay

## 2022-01-18 DIAGNOSIS — J45909 Unspecified asthma, uncomplicated: Secondary | ICD-10-CM

## 2022-01-21 ENCOUNTER — Ambulatory Visit (HOSPITAL_COMMUNITY): Payer: No Typology Code available for payment source

## 2022-02-18 ENCOUNTER — Ambulatory Visit
Admission: EM | Admit: 2022-02-18 | Discharge: 2022-02-18 | Disposition: A | Discharge status: Home / Self Care | Payer: Medicare Other | Attending: Urgent Care | Admitting: Urgent Care

## 2022-02-18 ENCOUNTER — Other Ambulatory Visit: Payer: Self-pay

## 2022-02-18 DIAGNOSIS — J309 Allergic rhinitis, unspecified: Secondary | ICD-10-CM | POA: Diagnosis not present

## 2022-02-18 DIAGNOSIS — J4540 Moderate persistent asthma, uncomplicated: Secondary | ICD-10-CM | POA: Diagnosis not present

## 2022-02-18 DIAGNOSIS — J329 Chronic sinusitis, unspecified: Secondary | ICD-10-CM | POA: Diagnosis not present

## 2022-02-18 MED ORDER — METHYLPREDNISOLONE SODIUM SUCC 125 MG IJ SOLR
125.0000 mg | Freq: Once | INTRAMUSCULAR | Status: AC
  Administered 2022-02-18: 125 mg via INTRAMUSCULAR

## 2022-02-18 MED ORDER — PROMETHAZINE-DM 6.25-15 MG/5ML PO SYRP: 5.0000 mL | ORAL_SOLUTION | Freq: Every evening | ORAL | 0 refills | Status: DC | PRN

## 2022-02-18 MED ORDER — DOXYCYCLINE HYCLATE 100 MG PO CAPS: 100.0000 mg | ORAL_CAPSULE | Freq: Two times a day (BID) | ORAL | 0 refills | Status: DC

## 2022-02-18 NOTE — ED Triage Notes (Signed)
Pt presents with c/o headache and facial pain with nasal congestion, pt has had symptoms for past month. Has h/o sinus surgery  ?

## 2022-02-18 NOTE — ED Provider Notes (Signed)
?[place] ? ? ?MRN: [id number] DOB: [birth year] ? ?Subjective:  ? ?Roberto Sanchez. is a 76 y.o. male presenting for 1 month history of persistent and worsening sinus congestion, sinus pressure, sinus pain.  He is also started coughing.  Has a history of recurrent sinus infections.  Has an ENT doctor that has wanted to do surgery but patient has delayed this.  He also has a history of chronic asthma.  Does not feel like he needs help with this at this time but wants to make sure he has his lungs evaluated as he has been coughing this past week.  No chest pain, shortness of breath or wheezing. ? ?No current facility-administered medications for this encounter. ? ?Current Outpatient Medications:  ?  albuterol (VENTOLIN HFA) 108 (90 Base) MCG/ACT inhaler, Inhale 2 puffs into the lungs every 6 (six) hours as needed for wheezing or shortness of breath., Disp: , Rfl:  ?  ALPRAZolam (XANAX) 0.5 MG tablet, Take 0.5 mg by mouth at bedtime., Disp: , Rfl:  ?  apixaban (ELIQUIS) 5 MG TABS tablet, Take 1 tablet (5 mg total) by mouth 2 (two) times daily., Disp: 180 tablet, Rfl: 3 ?  apixaban (ELIQUIS) 5 MG TABS tablet, Take 1 tablet tonight, and one in the morning. Make sure you pick up your RX from the [place] tomorrow 01/17/2022., Disp: 2 tablet, Rfl: 0 ?  Calcium Carb-Cholecalciferol (CALCIUM 600 + D PO), Take 1 tablet by mouth daily., Disp: , Rfl:  ?  cetirizine (ZYRTEC) 10 MG tablet, Take 10 mg by mouth daily., Disp: , Rfl:  ?  chlorhexidine (PERIDEX) 0.12 % solution, Use as directed 15 mLs in the mouth or throat 2 (two) times daily., Disp: , Rfl:  ?  diclofenac Sodium (VOLTAREN) 1 % GEL, Apply 4 g topically 4 (four) times daily., Disp: , Rfl:  ?  diltiazem (CARDIZEM CD) 120 MG 24 hr capsule, Take 1 capsule (120 mg total) by mouth daily., Disp: 90 capsule, Rfl: 3 ?  DULoxetine (CYMBALTA) 20 MG capsule, Take 20 mg by mouth at bedtime., Disp: , Rfl:  ?  ezetimibe (ZETIA) 10 MG tablet, Take 10 mg by mouth  daily., Disp: , Rfl:  ?  fluticasone (FLONASE) 50 MCG/ACT nasal spray, Place 1 spray into both nostrils daily as needed for allergies or rhinitis., Disp: , Rfl:  ?  Fluticasone-Salmeterol (ADVAIR) 250-50 MCG/DOSE AEPB, Inhale 1 puff into the lungs every 12 (twelve) hours., Disp: , Rfl:  ?  guaifenesin (HUMIBID E) 400 MG TABS tablet, Take 400 mg by mouth daily as needed (congestion)., Disp: , Rfl:  ?  hydroxypropyl methylcellulose / hypromellose (ISOPTO TEARS / GONIOVISC) 2.5 % ophthalmic solution, Place 1 drop into both eyes as needed for dry eyes. , Disp: , Rfl:  ?  ipratropium-albuterol (DUONEB) 0.5-2.5 (3) MG/3ML SOLN, Take 3 mLs by nebulization every 6 (six) hours as needed., Disp: 360 mL, Rfl: 0 ?  losartan (COZAAR) 100 MG tablet, Take 100 mg by mouth daily., Disp: , Rfl:  ?  montelukast (SINGULAIR) 10 MG tablet, Take 10 mg by mouth daily., Disp: , Rfl:  ?  Multiple Vitamin (MULTIVITAMIN WITH MINERALS) TABS tablet, Take 1 tablet by mouth daily., Disp: , Rfl:  ?  niacin 500 MG tablet, Take 500 mg by mouth daily. , Disp: , Rfl:  ?  Omega-3 Fatty Acids (FISH OIL) 1000 MG CAPS, Take 1,000 mg by mouth daily. , Disp: , Rfl:  ?  omeprazole (PRILOSEC) 20 MG capsule, Take  1 capsule (20 mg total) by mouth 2 (two) times daily before a meal. (Patient taking differently: Take 20 mg by mouth daily.), Disp: 60 capsule, Rfl: 5  ? ?Allergies  ?Allergen Reactions  ? Penicillins Other (See Comments)  ?  Causes patient to "pass out"DID THE REACTION INVOLVE: Swelling of the face/tongue/throat, SOB, or low BP? No ?Sudden or severe rash/hives, skin peeling, or the inside of the mouth or nose? No ?Did it require medical treatment? No ?When did it last happen?       ?If all above answers are ?NO?, may proceed with cephalosporin use. ?  ? Shellfish Allergy Hives, Shortness Of Breath and Swelling  ?  Facial Swelling  ?ears turn red  ? Atorvastatin Other (See Comments)  ?  Muscle pain and muscle weakness  ? Simvastatin   ?  Muscle pain  and muscle weakness  ? Codeine Nausea And Vomiting  ? ? ?Past Medical History:  ?Diagnosis Date  ? Anxiety   ? Arthritis   ? Asthma   ? GERD (gastroesophageal reflux disease)   ? Hypertension   ? Prostate CA [facility])   ? Rupture of right triceps tendon 09/01/2015  ?  ? ?Past Surgical History:  ?Procedure Laterality Date  ? BALLOON DILATION N/A 06/26/2021  ? Procedure: BALLOON DILATION;  Surgeon: Eloise Harman, DO;  [facility];  Service: Endoscopy;  Laterality: N/A;  ? COLONOSCOPY WITH PROPOFOL N/A 07/14/2020  ? diverticulosis, 5 polyps (the largest of which was 14 mm in the cecum) and recommended repeat in 1 year (tubular adenomas).   ? COLONOSCOPY WITH PROPOFOL N/A 06/26/2021  ? fair prep, non-bleeding internal hemorrhoids, sigmoid diverticulosis, two 4-7 mm polyps in cecum, one 2 mmp olyp in cecum. 3 year surveillance. Tubular adenoma.  ? ESOPHAGOGASTRODUODENOSCOPY (EGD) WITH PROPOFOL N/A 06/26/2021  ? moderate Shatzki ring, small hiatal hernia, gastritis s/p biopsy, normal duodenum. Negative H.pylori.  ? KNEE ARTHROPLASTY Left 10/2020  ? KNEE ARTHROSCOPY Right 1980s  ? NASAL SINUS SURGERY    ? POLYPECTOMY  07/14/2020  ? Procedure: POLYPECTOMY;  Surgeon: Eloise Harman, DO;  [facility];  Service: Endoscopy;;  ? POLYPECTOMY  06/26/2021  ? Procedure: POLYPECTOMY;  Surgeon: Eloise Harman, DO;  [facility];  Service: Endoscopy;;  ? PROSTATE SURGERY    ? TONSILLECTOMY  age 85  ? TRICEPS TENDON REPAIR Right 09/01/2015  ? Procedure: RIGHT TRICEPS  REPAIR;  Surgeon: Marchia Bond, MD;  Location: [place];  Service: Orthopedics;  Laterality: Right;  ? ? ?Family History  ?Problem Relation Age of Onset  ? Asthma Maternal Uncle   ? Allergic rhinitis Brother   ? Allergic rhinitis Brother   ? Colon cancer Father 2  ? Angioedema Neg Hx   ? Eczema Neg Hx   ? Urticaria Neg Hx   ? Immunodeficiency Neg Hx   ? ? ?Social History  ? ?Tobacco Use  ? Smoking status: Former  ?   Packs/day: 3.00  ?  Years: 20.00  ?  Pack years: 60.00  ?  Types: Cigarettes  ?  Quit date: 08/30/1983  ?  Years since quitting: 38.4  ? Smokeless tobacco: Former  ?  Types: Chew  ?  Quit date: 08/29/1985  ?Vaping Use  ? Vaping Use: Never used  ?Substance Use Topics  ? Alcohol use: Yes  ?  Comment: 3 to 5 beers weekly  ? Drug use: No  ? ? ?ROS ? ? ?Objective:  ? ?  Vitals: ?There were no vitals taken for this visit. ? ?Physical Exam ?Constitutional:   ?   General: He is not in acute distress. ?   Appearance: Normal appearance. He is well-developed and normal weight. He is not ill-appearing, toxic-appearing or diaphoretic.  ?HENT:  ?   Head: Normocephalic and atraumatic.  ?   Right Ear: Tympanic membrane, ear canal and external ear normal. There is no impacted cerumen.  ?   Left Ear: Tympanic membrane, ear canal and external ear normal. There is no impacted cerumen.  ?   Nose: Congestion and rhinorrhea present.  ?   Comments: Associated facial pain over the maxillary sinus regions. ?   Mouth/Throat:  ?   Mouth: Mucous membranes are moist.  ?   Pharynx: No oropharyngeal exudate or posterior oropharyngeal erythema.  ?Eyes:  ?   General: No scleral icterus.    ?   Right eye: No discharge.     ?   Left eye: No discharge.  ?   Extraocular Movements: Extraocular movements intact.  ?   Conjunctiva/sclera: Conjunctivae normal.  ?Cardiovascular:  ?   Rate and Rhythm: Normal rate and regular rhythm.  ?   Heart sounds: Normal heart sounds. No murmur heard. ?  No friction rub. No gallop.  ?Pulmonary:  ?   Effort: Pulmonary effort is normal. No respiratory distress.  ?   Breath sounds: Normal breath sounds. No stridor. No wheezing, rhonchi or rales.  ?Musculoskeletal:  ?   Cervical back: Normal range of motion and neck supple. No rigidity. No muscular tenderness.  ?Neurological:  ?   General: No focal deficit present.  ?   Mental Status: He is alert and oriented to person, place, and time.  ?Psychiatric:     ?   Mood and Affect: Mood  normal.     ?   Behavior: Behavior normal.     ?   Thought Content: Thought content normal.  ? ? ? ?Assessment and Plan :  ? ?PDMP not reviewed this encounter. ? ?1. Recurrent sinusitis   ?2. Moderate persi

## 2022-03-12 ENCOUNTER — Telehealth: Payer: Self-pay | Admitting: Cardiology

## 2022-03-12 NOTE — Telephone Encounter (Signed)
Pt states that the [place] is wanting to do a Stress Test on him. Pt wants to get a second opinion from Dr. Harl Bowie before doing. Please advise ?

## 2022-03-12 NOTE — Telephone Encounter (Signed)
[facility] stated [facility], but pt is unsure if he should proceed and asks for Dr. Nelly Laurence input. Please advise.  ?

## 2022-03-18 NOTE — Telephone Encounter (Signed)
I wonder if there was a reason that was perhaps not communicated to him. Typically random stress tests are not done, but perhaps there was discussion of some type of new symptoms or maybe they saw an abnormality on another test that required additional testing. He should reach out to the [place] physician who ordered and clarify specifically the indication ? ?Zandra Abts MD ?

## 2022-03-18 NOTE — Telephone Encounter (Signed)
Left a message for pt to call office back regarding stress test ?

## 2022-03-21 NOTE — Telephone Encounter (Signed)
Left a message for pt to call office back regarding stress test ?

## 2022-03-22 NOTE — Telephone Encounter (Signed)
Pt stated that he had stress test done 4/27. Pt stated that stress test was preformed at [facility]. Pt stated that he meant to call our office back and speak with [place] about stress test before hand, but it he had issues going on this week. Will request a copy of stress test from [place] for provider to review.  ?

## 2022-05-01 ENCOUNTER — Encounter: Payer: Self-pay | Admitting: Cardiology

## 2022-05-01 ENCOUNTER — Ambulatory Visit (INDEPENDENT_AMBULATORY_CARE_PROVIDER_SITE_OTHER): Payer: Medicare Other | Admitting: Cardiology

## 2022-05-01 ENCOUNTER — Telehealth: Payer: Self-pay

## 2022-05-01 VITALS — BP 130/68 | HR 69 | Wt 205.0 lb

## 2022-05-01 DIAGNOSIS — D6869 Other thrombophilia: Secondary | ICD-10-CM

## 2022-05-01 DIAGNOSIS — I10 Essential (primary) hypertension: Secondary | ICD-10-CM | POA: Diagnosis not present

## 2022-05-01 DIAGNOSIS — I480 Paroxysmal atrial fibrillation: Secondary | ICD-10-CM | POA: Diagnosis not present

## 2022-05-01 MED ORDER — METOPROLOL TARTRATE 25 MG PO TABS
25.0000 mg | ORAL_TABLET | Freq: Two times a day (BID) | ORAL | 3 refills | Status: AC
Start: 2022-05-01 — End: 2024-03-23

## 2022-05-01 NOTE — Telephone Encounter (Signed)
Per Dr. Harl Bowie:  Can you let him know got results back from [place]. Stress test was normal. 2 week monitor did not show any evidence of afib, we can stop elqiuis. He did have some episodes of another benign rhythm called SVT. Since we are stopping diltiazem in its place would start lopressor '25mg'$  bid to help prevent some of these episodes. Please copy this into his chart

## 2022-05-01 NOTE — Progress Notes (Signed)
Clinical Summary Mr. Roberto Sanchez is a 76 y.o.male  Afib - new diagnosis during 10/2021 admission in setting of pneumonia/sepsis.  - no recent palpitations - compliant with meds - wore a heart monitot at [place], he reports no recurrent afib - no bleeding on eliquis.  - some generalized fatigue he reports since discharge in December, unclear if could be related to dilt    Reports he also had a nuclear stress test at the [place]. Im not sure on indication. He denies chest pain, SOB/DOE. We have requested results.   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 No Sudden or severe rash/hives, skin peeling, or the inside of the mouth or nose? No Did it require medical treatment? No When did it last happen?       If all above answers are "NO", may proceed with cephalosporin use.    Shellfish Allergy Hives, Shortness Of Breath and Swelling    Facial Swelling  ears turn red   Atorvastatin Other (See Comments)    Muscle pain and muscle weakness Other reaction(s): Muscle weakness   Simvastatin     Muscle pain and muscle weakness   Codeine Nausea And Vomiting     Current Outpatient Medications  Medication Sig Dispense Refill   albuterol (VENTOLIN HFA) 108 (90 Base) MCG/ACT inhaler Inhale 2 puffs into the lungs every 6 (six) hours as needed for wheezing or shortness of breath.     ALPRAZolam (XANAX) 0.5 MG tablet Take 0.5 mg by mouth at bedtime.     apixaban (ELIQUIS) 5 MG TABS tablet Take 1 tablet (5 mg total) by mouth 2 (two) times daily. 180 tablet 3   apixaban (ELIQUIS) 5 MG TABS tablet Take 1 tablet tonight, and one in the morning. Make sure you pick up  your RX from the [place] tomorrow 01/17/2022. 2 tablet 0   Calcium Carb-Cholecalciferol (CALCIUM 600 + D PO) Take 1 tablet by mouth daily.     cetirizine (ZYRTEC) 10 MG tablet Take 10 mg by mouth daily.     chlorhexidine (PERIDEX) 0.12 % solution Use as directed 15 mLs in the mouth or throat 2 (two) times daily.     diclofenac Sodium (VOLTAREN) 1 % GEL Apply 4 g topically 4 (four) times daily.     diltiazem (CARDIZEM CD) 120 MG 24 hr capsule Take 1 capsule (120 mg total) by mouth daily. 90 capsule 3   doxycycline (VIBRAMYCIN) 100 MG capsule Take 1 capsule (100 mg total) by mouth 2 (two) times daily. 14 capsule 0   DULoxetine (CYMBALTA) 20 MG capsule Take 20 mg by mouth at bedtime.     ezetimibe (ZETIA) 10 MG tablet Take 10 mg by mouth daily.     fluticasone (FLONASE) 50 MCG/ACT nasal spray Place 1 spray into both nostrils daily as needed for allergies or rhinitis.     Fluticasone-Salmeterol (ADVAIR) 250-50 MCG/DOSE AEPB Inhale 1 puff into the lungs every 12 (twelve) hours.     guaifenesin (HUMIBID E) 400 MG TABS tablet Take 400 mg by mouth daily as needed (congestion).     hydroxypropyl  methylcellulose / hypromellose (ISOPTO TEARS / GONIOVISC) 2.5 % ophthalmic solution Place 1 drop into both eyes as needed for dry eyes.      ipratropium-albuterol (DUONEB) 0.5-2.5 (3) MG/3ML SOLN Take 3 mLs by nebulization every 6 (six) hours as needed. 360 mL 0   losartan (COZAAR) 100 MG tablet Take 100 mg by mouth daily.     montelukast (SINGULAIR) 10 MG tablet Take 10 mg by mouth daily.     Multiple Vitamin (MULTIVITAMIN WITH MINERALS) TABS tablet Take 1 tablet by mouth daily.     niacin 500 MG tablet Take 500 mg by mouth daily.      Omega-3 Fatty Acids (FISH OIL) 1000 MG CAPS Take 1,000 mg by mouth daily.      omeprazole (PRILOSEC) 20 MG capsule Take 1 capsule (20 mg total) by mouth 2 (two) times daily before a meal. (Patient taking differently: Take 20 mg by mouth daily.) 60 capsule 5    promethazine-dextromethorphan (PROMETHAZINE-DM) 6.25-15 MG/5ML syrup Take 5 mLs by mouth at bedtime as needed for cough. 100 mL 0   No current facility-administered medications for this visit.     Past Surgical History:  Procedure Laterality Date   BALLOON DILATION N/A 06/26/2021   Procedure: BALLOON DILATION;  Surgeon: Eloise Harman, DO;  [facility];  Service: Endoscopy;  Laterality: N/A;   COLONOSCOPY WITH PROPOFOL N/A 07/14/2020   diverticulosis, 5 polyps (the largest of which was 14 mm in the cecum) and recommended repeat in 1 year (tubular adenomas).    COLONOSCOPY WITH PROPOFOL N/A 06/26/2021   fair prep, non-bleeding internal hemorrhoids, sigmoid diverticulosis, two 4-7 mm polyps in cecum, one 2 mmp olyp in cecum. 3 year surveillance. Tubular adenoma.   ESOPHAGOGASTRODUODENOSCOPY (EGD) WITH PROPOFOL N/A 06/26/2021   moderate Shatzki ring, small hiatal hernia, gastritis s/p biopsy, normal duodenum. Negative H.pylori.   KNEE ARTHROPLASTY Left 10/2020   KNEE ARTHROSCOPY Right 1980s   NASAL SINUS SURGERY     POLYPECTOMY  07/14/2020   Procedure: POLYPECTOMY;  Surgeon: Eloise Harman, DO;  [facility];  Service: Endoscopy;;   POLYPECTOMY  06/26/2021   Procedure: POLYPECTOMY;  Surgeon: Eloise Harman, DO;  [facility];  Service: Endoscopy;;   PROSTATE SURGERY     TONSILLECTOMY  age 41   TRICEPS TENDON REPAIR Right 09/01/2015   Procedure: RIGHT TRICEPS  REPAIR;  Surgeon: Marchia Bond, MD;  Location: [place];  Service: Orthopedics;  Laterality: Right;     Allergies  Allergen Reactions   Penicillins Other (See Comments)    Causes patient to "pass out"DID THE REACTION INVOLVE: Swelling of the face/tongue/throat, SOB, or low BP? No Sudden or severe rash/hives, skin peeling, or the inside of the mouth or nose? No Did it require medical treatment? No When did it last happen?       If all above answers are "NO", may proceed  with cephalosporin use.    Shellfish Allergy Hives, Shortness Of Breath and Swelling    Facial Swelling  ears turn red   Atorvastatin Other (See Comments)    Muscle pain and muscle weakness Other reaction(s): Muscle weakness   Simvastatin     Muscle pain and muscle weakness   Codeine Nausea And Vomiting      Family History  Problem Relation Age of Onset   Asthma Maternal Uncle    Allergic rhinitis Brother    Allergic rhinitis Brother    Colon cancer Father 29  Angioedema Neg Hx    Eczema Neg Hx    Urticaria Neg Hx    Immunodeficiency Neg Hx      Social History Mr. Roberto Sanchez reports that he quit smoking about 38 years ago. His smoking use included cigarettes. He has a 60.00 pack-year smoking history. He quit smokeless tobacco use about 36 years ago.  His smokeless tobacco use included chew. Mr. Roberto Sanchez reports current alcohol use.   Review of Systems CONSTITUTIONAL: No weight loss, fever, chills, weakness or fatigue.  HEENT: Eyes: No visual loss, blurred vision, double vision or yellow sclerae.No hearing loss, sneezing, congestion, runny nose or sore throat.  SKIN: No rash or itching.  CARDIOVASCULAR: per hpi RESPIRATORY: No shortness of breath, cough or sputum.  GASTROINTESTINAL: No anorexia, nausea, vomiting or diarrhea. No abdominal pain or blood.  GENITOURINARY: No burning on urination, no polyuria NEUROLOGICAL: No headache, dizziness, syncope, paralysis, ataxia, numbness or tingling in the extremities. No change in bowel or bladder control.  MUSCULOSKELETAL: No muscle, back pain, joint pain or stiffness.  LYMPHATICS: No enlarged nodes. No history of splenectomy.  PSYCHIATRIC: No history of depression or anxiety.  ENDOCRINOLOGIC: No reports of sweating, cold or heat intolerance. No polyuria or polydipsia.  Marland Kitchen   Physical Examination Today's Vitals   05/01/22 0815  BP: 130/68  Pulse: 69  SpO2: 97%  Weight: 205 lb (93 kg)   Body mass index is 29.41 kg/m.  Gen:  resting comfortably, no acute distress HEENT: no scleral icterus, pupils equal round and reactive, no palptable cervical adenopathy,  CV: RRR, no m/rg, no jvd Resp: Clear to auscultation bilaterally GI: abdomen is soft, non-tender, non-distended, normal bowel sounds, no hepatosplenomegaly MSK: extremities are warm, no edema.  Skin: warm, no rash Neuro:  no focal deficits Psych: appropriate affect   Diagnostic Studies  10/2021 echo  1. Left ventricular ejection fraction, by estimation, is 60 to 65%. The  left ventricle has normal function. The left ventricle has no regional  wall motion abnormalities. There is mild left ventricular hypertrophy.  Left ventricular diastolic parameters  were normal.   2. Right ventricular systolic function is normal. The right ventricular  size is normal. There is normal pulmonary artery systolic pressure.   3. The mitral valve is normal in structure. Trivial mitral valve  regurgitation. No evidence of mitral stenosis.   4. The aortic valve was not well visualized. There is mild calcification  of the aortic valve. There is mild thickening of the aortic valve. Aortic  valve regurgitation is not visualized. No aortic stenosis is present.   5. The inferior vena cava is normal in size with greater than 50%  respiratory variability, suggesting right atrial pressure of 3 mmHg.    Assessment and Plan  1.Afib/acquired thrombophilia - new diagnosis during recent admission with pneumonia/sepsis - unclear if isoalted episode. We had planned for outpatient monitor but he reports the [place] already has done one. He thinks there was no afib, if we can confirm with results then can stop eliquis - nonspecific fatigue unclear if related to diltiazem, we will d/c - EKG today shows SR  2.HTN - at goal, monitor off dilt  Virtual appt 3 weeks, should have [facility] results then and discuss stopping anticoag. Also request nuclear stress test from [place] that was done.      Arnoldo Lenis, M.D.,

## 2022-05-01 NOTE — Patient Instructions (Signed)
Medication Instructions:  Your physician has recommended you make the following change in your medication:  Stop Diltiazem   Labwork: None  Testing/Procedures: None  Follow-Up: Follow up in 3 weeks with Dr. Harl Bowie- Virtual Visit  Any Other Special Instructions Will Be Listed Below (If Applicable).     If you need a refill on your cardiac medications before your next appointment, please call your pharmacy.

## 2022-05-01 NOTE — Telephone Encounter (Signed)
Patient notified and verbalized understanding. Pt had no further questions or concerns at this time.

## 2022-05-20 ENCOUNTER — Telehealth: Payer: Self-pay

## 2022-05-20 ENCOUNTER — Encounter: Payer: Self-pay | Admitting: Cardiology

## 2022-05-20 ENCOUNTER — Ambulatory Visit (INDEPENDENT_AMBULATORY_CARE_PROVIDER_SITE_OTHER): Payer: Medicare Other | Admitting: Cardiology

## 2022-05-20 VITALS — BP 121/53 | HR 71 | Ht 71.0 in | Wt 210.0 lb

## 2022-05-20 DIAGNOSIS — I4891 Unspecified atrial fibrillation: Secondary | ICD-10-CM | POA: Diagnosis not present

## 2022-05-20 DIAGNOSIS — I471 Supraventricular tachycardia: Secondary | ICD-10-CM | POA: Diagnosis not present

## 2022-07-05 ENCOUNTER — Ambulatory Visit (HOSPITAL_COMMUNITY): Payer: No Typology Code available for payment source

## 2022-08-28 ENCOUNTER — Ambulatory Visit
Admission: EM | Admit: 2022-08-28 | Discharge: 2022-08-28 | Disposition: A | Discharge status: Home / Self Care | Payer: Medicare Other | Attending: Nurse Practitioner | Admitting: Nurse Practitioner

## 2022-08-28 DIAGNOSIS — J0100 Acute maxillary sinusitis, unspecified: Secondary | ICD-10-CM | POA: Diagnosis not present

## 2022-08-28 MED ORDER — DOXYCYCLINE HYCLATE 100 MG PO TABS
100.0000 mg | ORAL_TABLET | Freq: Two times a day (BID) | ORAL | 0 refills | Status: DC
Start: 2022-08-28 — End: 2022-12-09

## 2022-08-28 MED ORDER — FLUTICASONE PROPIONATE 50 MCG/ACT NA SUSP
2.0000 | Freq: Every day | NASAL | 0 refills | Status: DC
Start: 2022-08-28 — End: 2022-12-01

## 2022-08-28 NOTE — Discharge Instructions (Signed)
Take medication as directed. Increase fluids and get plenty of rest. May take over-the-counter Tylenol as needed for pain, fever, or general discomfort. Recommend normal saline nasal spray to help with nasal congestion throughout the day. For your cough and nasal congestion, it may be helpful to use a humidifier at bedtime during sleep. Also recommend sleeping elevated on 2 pillows while cough symptoms persist. If your symptoms fail to improve after this treatment, please follow-up with your primary care physician for further evaluation.

## 2022-08-28 NOTE — ED Provider Notes (Signed)
[facility]    CSN: [id number] Arrival date & time: 08/28/22  1201      History   Chief Complaint Chief Complaint  Patient presents with   Nasal Congestion   Headache         HPI Roberto Kutzer. is a 76 y.o. male.   The history is provided by the patient.   Patient presents with a more than 1 week history of headache, nasal congestion, postnasal drainage, and runny nose.  Patient states he also has sinus pressure around his nose.  He denies fever, chills, sore throat, wheezing, shortness of breath, difficulty breathing.  Patient states that he does have a cough that started last evening.  Cough is not productive at this time.  Patient states he has been taking over-the-counter medicine for his symptoms with minimal relief.  Patient reports a history of recurrent infections.  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[place])    Rupture of right triceps tendon 09/01/2015    Patient Active Problem List   Diagnosis Date Noted   Acute respiratory failure with hypoxia ([place]) 11/01/2021   Atrial fibrillation with RVR ([place]) 10/31/2021   Community acquired pneumonia 10/31/2021   Elevated d-dimer 10/31/2021   Hyperlipidemia 10/31/2021   Severe sepsis ([place]) 10/29/2021   Pill dysphagia 04/05/2021   History of colonic polyps 01/31/2021   Bloating 07/25/2020   GERD (gastroesophageal reflux disease) 07/25/2020   Constipation 04/20/2020   Family history of colon cancer 04/20/2020   Rupture of right triceps tendon 09/01/2015   Anxiety 03/20/2015   Asthma, chronic    Vertigo 03/19/2015   Essential hypertension 03/19/2015   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    COLONOSCOPY WITH PROPOFOL N/A 06/26/2021   fair prep, non-bleeding internal hemorrhoids, sigmoid diverticulosis, two 4-7 mm polyps in cecum, one 2 mmp olyp in cecum. 3 year surveillance. Tubular adenoma.   ESOPHAGOGASTRODUODENOSCOPY (EGD) WITH PROPOFOL N/A 06/26/2021   moderate Shatzki ring, small hiatal hernia, gastritis s/p biopsy, normal duodenum. Negative H.pylori.   KNEE ARTHROPLASTY Left 10/2020   KNEE ARTHROSCOPY Right 1980s   NASAL SINUS SURGERY     POLYPECTOMY  07/14/2020   Procedure: POLYPECTOMY;  Surgeon: Eloise Harman, DO;  [facility];  Service: Endoscopy;;   POLYPECTOMY  06/26/2021   Procedure: POLYPECTOMY;  Surgeon: Eloise Harman, DO;  [facility];  Service: Endoscopy;;   PROSTATE SURGERY     TONSILLECTOMY  age 89   TRICEPS TENDON REPAIR Right 09/01/2015   Procedure: RIGHT TRICEPS  REPAIR;  Surgeon: Marchia Bond, MD;  Location: [place];  Service: Orthopedics;  Laterality: Right;       Home Medications    Prior to Admission medications   Medication Sig Start Date End Date Taking? Authorizing Provider  doxycycline (VIBRA-TABS) 100 MG tablet Take 1 tablet (100 mg total) by mouth 2 (two) times daily. 08/28/22  Yes Saleema Weppler-Warren, Alda Lea, NP  fluticasone (FLONASE) 50 MCG/ACT nasal spray Place 2 sprays into both nostrils daily. 08/28/22  Yes Wymon Swaney-Warren, Alda Lea, NP  albuterol (VENTOLIN HFA) 108 (90 Base) MCG/ACT inhaler Inhale 2 puffs into the lungs every 6 (six) hours as needed for wheezing or shortness of breath. 03/16/08   [provider]  ALPRAZolam Duanne Moron) 0.5 MG  tablet Take 0.5 mg by mouth at bedtime. 10/22/21   [provider]  Calcium Carb-Cholecalciferol (CALCIUM 600 + D PO) Take 1 tablet by mouth daily.    [provider]  cetirizine (ZYRTEC) 10 MG tablet Take 10 mg by mouth daily. 10/22/21   [provider]  chlorhexidine (PERIDEX) 0.12 % solution Use as directed 15 mLs in the mouth or throat 2 (two) times daily. 10/22/21   [provider]  diclofenac Sodium (VOLTAREN) 1 % GEL Apply 4 g topically 4 (four) times daily. 10/22/21   [provider]  DULoxetine (CYMBALTA) 20 MG capsule Take 20 mg by mouth at bedtime.    [provider]  ezetimibe (ZETIA) 10 MG tablet Take 10 mg by mouth daily.    [provider]  Fluticasone-Salmeterol (ADVAIR) 250-50 MCG/DOSE AEPB Inhale 1 puff into the lungs every 12 (twelve) hours.    [provider]  guaifenesin (HUMIBID E) 400 MG TABS tablet Take 400 mg by mouth daily as needed (congestion).    [provider]  hydroxypropyl methylcellulose / hypromellose (ISOPTO TEARS / GONIOVISC) 2.5 % ophthalmic solution Place 1 drop into both eyes as needed for dry eyes.     [provider]  ipratropium-albuterol (DUONEB) 0.5-2.5 (3) MG/3ML SOLN Take 3 mLs by nebulization every 6 (six) hours as needed. 11/03/21   Mariel Aloe, MD  losartan (COZAAR) 100 MG tablet Take 100 mg by mouth daily.    [provider]  metoprolol tartrate (LOPRESSOR) 25 MG tablet Take 1 tablet (25 mg total) by mouth 2 (two) times daily. 05/01/22 04/26/23  Arnoldo Lenis, MD  montelukast (SINGULAIR) 10 MG tablet Take 10 mg by mouth daily. 03/18/15   [provider]  Multiple Vitamin (MULTIVITAMIN WITH MINERALS) TABS tablet Take 1 tablet by mouth daily.    [provider]  niacin 500 MG tablet Take 500 mg by mouth daily.     [provider]  Omega-3 Fatty Acids (FISH OIL) 1000 MG CAPS Take 1,000 mg by mouth daily.     [provider]  omeprazole (PRILOSEC) 20 MG capsule Take 1 capsule (20 mg total) by mouth 2 (two) times daily before a meal. Patient taking differently: Take 20 mg by mouth daily. 06/27/21 05/20/22  Mahala Menghini, PA-C  promethazine-dextromethorphan (PROMETHAZINE-DM) 6.25-15 MG/5ML syrup Take 5 mLs by mouth at bedtime as needed for cough. 02/18/22   Jaynee Eagles, PA-C    Family History Family History  Problem Relation Age of Onset   Colon cancer Father 4   Allergic rhinitis Brother    Allergic rhinitis Brother    Asthma Maternal Uncle    Angioedema Neg Hx    Eczema Neg Hx    Urticaria Neg Hx    Immunodeficiency Neg Hx     Social History Social History   Tobacco Use   Smoking status: Former    Packs/day: 3.00    Years: 20.00    Total pack years: 60.00  Types: Cigarettes    Quit date: 08/30/1983    Years since quitting: 39.0   Smokeless tobacco: Former    Types: Chew    Quit date: 08/29/1985  Vaping Use   Vaping Use: Never used  Substance Use Topics   Alcohol use: Yes    Comment: 3 to 5 beers weekly   Drug use: No     Allergies   Penicillins, Shellfish allergy, Atorvastatin, Simvastatin, and Codeine   Review of Systems Review of Systems Per HPI  Physical Exam Triage Vital Signs ED Triage Vitals [08/28/22 1214]  Enc Vitals Group     BP (!) 148/80     Pulse Rate 69     Resp 20     Temp 98.5 F (36.9 C)     Temp Source Oral     SpO2 95 %     Weight      Height      Head Circumference      Peak Flow      Pain Score 3     Pain Loc      Pain Edu?      Excl. in [place]?    No data found.  Updated Vital Signs BP (!) 148/80 (BP Location: Right Arm)   Pulse 69   Temp 98.5 F (36.9 C) (Oral)   Resp 20   SpO2 95%   Visual Acuity Right Eye Distance:   Left Eye Distance:   Bilateral Distance:    Right Eye Near:   Left Eye Near:    Bilateral Near:     Physical Exam Vitals and nursing note reviewed.  Constitutional:      General: He is not in acute  distress.    Appearance: Normal appearance. He is well-developed.  HENT:     Head: Normocephalic and atraumatic.     Right Ear: Tympanic membrane, ear canal and external ear normal.     Left Ear: Tympanic membrane, ear canal and external ear normal.     Nose: Congestion present. No rhinorrhea.     Right Turbinates: Enlarged and swollen.     Left Turbinates: Enlarged and swollen.     Right Sinus: Maxillary sinus tenderness present. No frontal sinus tenderness.     Left Sinus: Maxillary sinus tenderness present. No frontal sinus tenderness.     Mouth/Throat:     Lips: Pink.     Mouth: Mucous membranes are moist.     Pharynx: Uvula midline. Posterior oropharyngeal erythema present. No pharyngeal swelling, oropharyngeal exudate or uvula swelling.  Eyes:     Extraocular Movements: Extraocular movements intact.     Conjunctiva/sclera: Conjunctivae normal.     Pupils: Pupils are equal, round, and reactive to light.  Neck:     Thyroid: No thyromegaly.     Trachea: No tracheal deviation.  Cardiovascular:     Rate and Rhythm: Normal rate and regular rhythm.     Heart sounds: Normal heart sounds.  Pulmonary:     Effort: Pulmonary effort is normal.     Breath sounds: Normal breath sounds.  Abdominal:     General: Bowel sounds are normal. There is no distension.     Palpations: Abdomen is soft.     Tenderness: There is no abdominal tenderness.  Musculoskeletal:     Cervical back: Normal range of motion and neck supple.  Skin:    General: Skin is warm and dry.  Neurological:     General: No focal deficit present.  Mental Status: He is alert and oriented to person, place, and time.  Psychiatric:        Mood and Affect: Mood normal.        Behavior: Behavior normal.        Thought Content: Thought content normal.        Judgment: Judgment normal.      UC Treatments / Results  Labs (all labs ordered are listed, but only abnormal results are displayed) Labs Reviewed - No data to  display  EKG   Radiology No results found.  Procedures Procedures (including critical care time)  Medications Ordered in UC Medications - No data to display  Initial Impression / Assessment and Plan / UC Course  I have reviewed the triage vital signs and the nursing notes.  Pertinent labs & imaging results that were available during my care of the patient were reviewed by me and considered in my medical decision making (see chart for details).  Patient presents with a greater than 1 week history of sinus symptoms.  On exam, patient has maxillary sinus tenderness and pressure.  Based on the patient's current presentation, duration of symptoms, will start patient on doxycycline for an acute maxillary sinusitis.  Patient was also prescribed fluticasone for his symptoms.  Supportive care recommendations were provided to the patient along with strict indications of when to follow-up.  Patient verbalizes understanding.  All questions were answered. Final Clinical Impressions(s) / UC Diagnoses   Final diagnoses:  Acute maxillary sinusitis, recurrence not specified     Discharge Instructions      Take medication as directed. Increase fluids and get plenty of rest. May take over-the-counter Tylenol as needed for pain, fever, or general discomfort. Recommend normal saline nasal spray to help with nasal congestion throughout the day. For your cough and nasal congestion, it may be helpful to use a humidifier at bedtime during sleep. Also recommend sleeping elevated on 2 pillows while cough symptoms persist. If your symptoms fail to improve after this treatment, please follow-up with your primary care physician for further evaluation.     ED Prescriptions     Medication Sig Dispense Auth. Provider   doxycycline (VIBRA-TABS) 100 MG tablet Take 1 tablet (100 mg total) by mouth 2 (two) times daily. 14 tablet Nicolette Gieske-Warren, Alda Lea, NP   fluticasone (FLONASE) 50 MCG/ACT nasal spray  Place 2 sprays into both nostrils daily. 16 g Rondarius Kadrmas-Warren, Alda Lea, NP      PDMP not reviewed this encounter.   Tish Men, NP 08/28/22 1247

## 2022-08-28 NOTE — ED Triage Notes (Signed)
Pt reports headache, sinus pressure and nasal congestion for over a week. OTC meds give some relief.

## 2022-11-06 ENCOUNTER — Ambulatory Visit: Payer: No Typology Code available for payment source | Admitting: Cardiology

## 2022-11-06 NOTE — Progress Notes (Deleted)
Clinical Summary Roberto Sanchez is a 76 y.o.male  seen today for follow up of the following medical problems.      Afib - new diagnosis during 10/2021 admission in setting of pneumonia/sepsis.  - [facility]: SR, 4 beast NSVT, 104 episodes SVT longest 19 seconds at rate 110 - we stopped dilt prevoiusly as thought perhaps was causing some fatigue, started lopressor '25mg'$  bid. Generalized fatigue slightly improved.  - without recurrence of afib we stopped his anticoagulation at last visit.   2.PSVT - noted on recent [facility] moniotor - he denies symptoms, currently on lopressor '25mg'$  bid     Reports he also had a nuclear stress test at the [place].We have obtained results, nuclear stress test with normal perfusion 12/2021 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[place])    Rupture of right triceps tendon 09/01/2015     Allergies  Allergen Reactions   Penicillins Other (See Comments)    Causes patient to "pass out"DID THE REACTION INVOLVE: Swelling of the face/tongue/throat, SOB, or low BP? No Sudden or severe rash/hives, skin peeling, or the inside of the mouth or nose? No Did it require medical treatment? No When did it last happen?       If all above answers are "NO", may proceed with cephalosporin use.    Shellfish Allergy Hives, Shortness Of Breath and Swelling    Facial Swelling  ears turn red   Atorvastatin Other (See Comments)    Muscle pain and muscle weakness Other reaction(s): Muscle weakness   Simvastatin     Muscle pain and muscle weakness   Codeine Nausea And Vomiting     Current Outpatient Medications  Medication Sig Dispense Refill   albuterol (VENTOLIN HFA) 108 (90 Base) MCG/ACT inhaler Inhale 2 puffs into the lungs every 6 (six) hours as needed for wheezing or shortness of breath.     ALPRAZolam (XANAX) 0.5 MG tablet Take 0.5 mg by mouth at  bedtime.     Calcium Carb-Cholecalciferol (CALCIUM 600 + D PO) Take 1 tablet by mouth daily.     cetirizine (ZYRTEC) 10 MG tablet Take 10 mg by mouth daily.     chlorhexidine (PERIDEX) 0.12 % solution Use as directed 15 mLs in the mouth or throat 2 (two) times daily.     diclofenac Sodium (VOLTAREN) 1 % GEL Apply 4 g topically 4 (four) times daily.     doxycycline (VIBRA-TABS) 100 MG tablet Take 1 tablet (100 mg total) by mouth 2 (two) times daily. 14 tablet 0   DULoxetine (CYMBALTA) 20 MG capsule Take 20 mg by mouth at bedtime.     ezetimibe (ZETIA) 10 MG tablet Take 10 mg by mouth daily.     fluticasone (FLONASE) 50 MCG/ACT nasal spray Place 2 sprays into both nostrils daily. 16 g 0   Fluticasone-Salmeterol (ADVAIR) 250-50 MCG/DOSE AEPB Inhale 1 puff into the lungs every 12 (twelve) hours.     guaifenesin (HUMIBID E) 400 MG TABS tablet Take 400 mg by mouth daily as needed (congestion).     hydroxypropyl methylcellulose / hypromellose (ISOPTO TEARS / GONIOVISC) 2.5 % ophthalmic solution Place 1 drop into both eyes as needed for dry eyes.      ipratropium-albuterol (DUONEB) 0.5-2.5 (3) MG/3ML SOLN Take 3 mLs by nebulization every 6 (six) hours as needed.  360 mL 0   losartan (COZAAR) 100 MG tablet Take 100 mg by mouth daily.     metoprolol tartrate (LOPRESSOR) 25 MG tablet Take 1 tablet (25 mg total) by mouth 2 (two) times daily. 180 tablet 3   montelukast (SINGULAIR) 10 MG tablet Take 10 mg by mouth daily.     Multiple Vitamin (MULTIVITAMIN WITH MINERALS) TABS tablet Take 1 tablet by mouth daily.     niacin 500 MG tablet Take 500 mg by mouth daily.      Omega-3 Fatty Acids (FISH OIL) 1000 MG CAPS Take 1,000 mg by mouth daily.      omeprazole (PRILOSEC) 20 MG capsule Take 1 capsule (20 mg total) by mouth 2 (two) times daily before a meal. (Patient taking differently: Take 20 mg by mouth daily.) 60 capsule 5   promethazine-dextromethorphan (PROMETHAZINE-DM) 6.25-15 MG/5ML syrup Take 5 mLs by mouth  at bedtime as needed for cough. 100 mL 0   No current facility-administered medications for this visit.     Past Surgical History:  Procedure Laterality Date   BALLOON DILATION N/A 06/26/2021   Procedure: BALLOON DILATION;  Surgeon: Eloise Harman, DO;  [facility];  Service: Endoscopy;  Laterality: N/A;   COLONOSCOPY WITH PROPOFOL N/A 07/14/2020   diverticulosis, 5 polyps (the largest of which was 14 mm in the cecum) and recommended repeat in 1 year (tubular adenomas).    COLONOSCOPY WITH PROPOFOL N/A 06/26/2021   fair prep, non-bleeding internal hemorrhoids, sigmoid diverticulosis, two 4-7 mm polyps in cecum, one 2 mmp olyp in cecum. 3 year surveillance. Tubular adenoma.   ESOPHAGOGASTRODUODENOSCOPY (EGD) WITH PROPOFOL N/A 06/26/2021   moderate Shatzki ring, small hiatal hernia, gastritis s/p biopsy, normal duodenum. Negative H.pylori.   KNEE ARTHROPLASTY Left 10/2020   KNEE ARTHROSCOPY Right 1980s   NASAL SINUS SURGERY     POLYPECTOMY  07/14/2020   Procedure: POLYPECTOMY;  Surgeon: Eloise Harman, DO;  [facility];  Service: Endoscopy;;   POLYPECTOMY  06/26/2021   Procedure: POLYPECTOMY;  Surgeon: Eloise Harman, DO;  [facility];  Service: Endoscopy;;   PROSTATE SURGERY     TONSILLECTOMY  age 36   TRICEPS TENDON REPAIR Right 09/01/2015   Procedure: RIGHT TRICEPS  REPAIR;  Surgeon: Marchia Bond, MD;  Location: [place];  Service: Orthopedics;  Laterality: Right;     Allergies  Allergen Reactions   Penicillins Other (See Comments)    Causes patient to "pass out"DID THE REACTION INVOLVE: Swelling of the face/tongue/throat, SOB, or low BP? No Sudden or severe rash/hives, skin peeling, or the inside of the mouth or nose? No Did it require medical treatment? No When did it last happen?       If all above answers are "NO", may proceed with cephalosporin use.    Shellfish Allergy Hives, Shortness Of Breath and Swelling     Facial Swelling  ears turn red   Atorvastatin Other (See Comments)    Muscle pain and muscle weakness Other reaction(s): Muscle weakness   Simvastatin     Muscle pain and muscle weakness   Codeine Nausea And Vomiting      Family History  Problem Relation Age of Onset   Colon cancer Father 7   Allergic rhinitis Brother    Allergic rhinitis Brother    Asthma Maternal Uncle    Angioedema Neg Hx    Eczema Neg Hx    Urticaria Neg Hx    Immunodeficiency Neg Hx  Social History Mr. Ostlund reports that he quit smoking about 39 years ago. His smoking use included cigarettes. He has a 60.00 pack-year smoking history. He quit smokeless tobacco use about 37 years ago.  His smokeless tobacco use included chew. Mr. Majeed reports current alcohol use.   Review of Systems CONSTITUTIONAL: No weight loss, fever, chills, weakness or fatigue.  HEENT: Eyes: No visual loss, blurred vision, double vision or yellow sclerae.No hearing loss, sneezing, congestion, runny nose or sore throat.  SKIN: No rash or itching.  CARDIOVASCULAR:  RESPIRATORY: No shortness of breath, cough or sputum.  GASTROINTESTINAL: No anorexia, nausea, vomiting or diarrhea. No abdominal pain or blood.  GENITOURINARY: No burning on urination, no polyuria NEUROLOGICAL: No headache, dizziness, syncope, paralysis, ataxia, numbness or tingling in the extremities. No change in bowel or bladder control.  MUSCULOSKELETAL: No muscle, back pain, joint pain or stiffness.  LYMPHATICS: No enlarged nodes. No history of splenectomy.  PSYCHIATRIC: No history of depression or anxiety.  ENDOCRINOLOGIC: No reports of sweating, cold or heat intolerance. No polyuria or polydipsia.  Marland Kitchen   Physical Examination There were no vitals filed for this visit. There were no vitals filed for this visit.  Gen: resting comfortably, no acute distress HEENT: no scleral icterus, pupils equal round and reactive, no palptable cervical adenopathy,   CV Resp: Clear to auscultation bilaterally GI: abdomen is soft, non-tender, non-distended, normal bowel sounds, no hepatosplenomegaly MSK: extremities are warm, no edema.  Skin: warm, no rash Neuro:  no focal deficits Psych: appropriate affect   Diagnostic Studies  10/2021 echo   1. Left ventricular ejection fraction, by estimation, is 60 to 65%. The  left ventricle has normal function. The left ventricle has no regional  wall motion abnormalities. There is mild left ventricular hypertrophy.  Left ventricular diastolic parameters  were normal.   2. Right ventricular systolic function is normal. The right ventricular  size is normal. There is normal pulmonary artery systolic pressure.   3. The mitral valve is normal in structure. Trivial mitral valve  regurgitation. No evidence of mitral stenosis.   4. The aortic valve was not well visualized. There is mild calcification  of the aortic valve. There is mild thickening of the aortic valve. Aortic  valve regurgitation is not visualized. No aortic stenosis is present.   5. The inferior vena cava is normal in size with greater than 50%  respiratory variability, suggesting right atrial pressure of 3 mmHg.      12/2021 [facility] stress [facility] No ischemia   Assessment and Plan  1.Afib/acquired thrombophilia - appears to have been isolated episodes during admission with pneumonia/sepsis. Recent outpatient monitor done by [facility] withour recurrence - we have stopped anticoag, continue to monitor at this time     2. PSVT - noted on recent monitor done by [facility] - no symptoms. Generalized fatigue unclear if was related to dilt, we changed to lopressor last visit feels slightly better. Continue lopressor at this time      Arnoldo Lenis, M.D., F.A.C.C.

## 2022-11-07 ENCOUNTER — Encounter: Payer: Self-pay | Admitting: Cardiology

## 2022-11-26 ENCOUNTER — Encounter: Payer: Self-pay | Admitting: Emergency Medicine

## 2022-11-26 ENCOUNTER — Ambulatory Visit
Admission: EM | Admit: 2022-11-26 | Discharge: 2022-11-26 | Disposition: A | Discharge status: Home / Self Care | Payer: No Typology Code available for payment source | Attending: Urgent Care | Admitting: Urgent Care

## 2022-11-26 DIAGNOSIS — Z1152 Encounter for screening for COVID-19: Secondary | ICD-10-CM | POA: Diagnosis not present

## 2022-11-26 DIAGNOSIS — Z7952 Long term (current) use of systemic steroids: Secondary | ICD-10-CM | POA: Insufficient documentation

## 2022-11-26 DIAGNOSIS — I4891 Unspecified atrial fibrillation: Secondary | ICD-10-CM | POA: Insufficient documentation

## 2022-11-26 DIAGNOSIS — Z79899 Other long term (current) drug therapy: Secondary | ICD-10-CM | POA: Insufficient documentation

## 2022-11-26 DIAGNOSIS — B349 Viral infection, unspecified: Secondary | ICD-10-CM | POA: Diagnosis not present

## 2022-11-26 DIAGNOSIS — J4530 Mild persistent asthma, uncomplicated: Secondary | ICD-10-CM | POA: Insufficient documentation

## 2022-11-26 MED ORDER — PROMETHAZINE-DM 6.25-15 MG/5ML PO SYRP: 5.0000 mL | ORAL_SOLUTION | Freq: Every evening | ORAL | 0 refills | Status: DC | PRN

## 2022-11-26 MED ORDER — PREDNISONE 20 MG PO TABS: 40.0000 mg | ORAL_TABLET | Freq: Every day | ORAL | 0 refills | Status: DC

## 2022-11-26 NOTE — ED Triage Notes (Signed)
Cough x 4 days and headache and nasal congestion

## 2022-11-26 NOTE — Discharge Instructions (Signed)
We will notify you of your test results as they arrive and may take between about 24 hours.  I encourage you to sign up for MyChart if you have not already done so as this can be the easiest way for [place] to communicate results to you online or through a phone app.  Generally, we only contact you if it is a positive test result.  In the meantime, if you develop worsening symptoms including fever, chest pain, shortness of breath despite our current treatment plan then please report to the emergency room as this may be a sign of worsening status from possible viral infection.  Otherwise, we will manage this as a viral syndrome. For sore throat or cough try using a honey-based tea. Use 3 teaspoons of honey with juice squeezed from half lemon. Place shaved pieces of ginger into 1/2-1 cup of water and warm over stove top. Then mix the ingredients and repeat every 4 hours as needed. Please take Tylenol '500mg'$ -'650mg'$  every 6 hours for aches and pains, fevers. Hydrate very well with at least 2 liters of water. Eat light meals such as soups to replenish electrolytes and soft fruits, veggies. Start an antihistamine like Zyrtec for postnasal drainage, sinus congestion.  You can take this together with prednisone and albuterol.  Use the cough medications as needed.

## 2022-11-26 NOTE — ED Provider Notes (Signed)
[facility]  Note:  This document was prepared using Systems analyst and may include unintentional dictation errors.  MRN: [id number] DOB: 19-Oct-1946  Subjective:   Roberto Sing. is a 77 y.o. male presenting for 4-day history of acute onset sinus congestion, sinus headaches, coughing, chest tightness with his coughing. Has been having coughing fits.  No fever, chest pain, shortness of breath or wheezing.  Patient takes all his medications as prescribed for his atrial fibrillation.  Has a history of asthma as well.  No current facility-administered medications for this encounter.  Current Outpatient Medications:    albuterol (VENTOLIN HFA) 108 (90 Base) MCG/ACT inhaler, Inhale 2 puffs into the lungs every 6 (six) hours as needed for wheezing or shortness of breath., Disp: , Rfl:    ALPRAZolam (XANAX) 0.5 MG tablet, Take 0.5 mg by mouth at bedtime., Disp: , Rfl:    Calcium Carb-Cholecalciferol (CALCIUM 600 + D PO), Take 1 tablet by mouth daily., Disp: , Rfl:    cetirizine (ZYRTEC) 10 MG tablet, Take 10 mg by mouth daily., Disp: , Rfl:    chlorhexidine (PERIDEX) 0.12 % solution, Use as directed 15 mLs in the mouth or throat 2 (two) times daily., Disp: , Rfl:    diclofenac Sodium (VOLTAREN) 1 % GEL, Apply 4 g topically 4 (four) times daily., Disp: , Rfl:    doxycycline (VIBRA-TABS) 100 MG tablet, Take 1 tablet (100 mg total) by mouth 2 (two) times daily., Disp: 14 tablet, Rfl: 0   DULoxetine (CYMBALTA) 20 MG capsule, Take 20 mg by mouth at bedtime., Disp: , Rfl:    ezetimibe (ZETIA) 10 MG tablet, Take 10 mg by mouth daily., Disp: , Rfl:    fluticasone (FLONASE) 50 MCG/ACT nasal spray, Place 2 sprays into both nostrils daily., Disp: 16 g, Rfl: 0   Fluticasone-Salmeterol (ADVAIR) 250-50 MCG/DOSE AEPB, Inhale 1 puff into the lungs every 12 (twelve) hours., Disp: , Rfl:    guaifenesin (HUMIBID E) 400 MG TABS tablet, Take 400 mg by mouth daily as  needed (congestion)., Disp: , Rfl:    hydroxypropyl methylcellulose / hypromellose (ISOPTO TEARS / GONIOVISC) 2.5 % ophthalmic solution, Place 1 drop into both eyes as needed for dry eyes. , Disp: , Rfl:    ipratropium-albuterol (DUONEB) 0.5-2.5 (3) MG/3ML SOLN, Take 3 mLs by nebulization every 6 (six) hours as needed., Disp: 360 mL, Rfl: 0   losartan (COZAAR) 100 MG tablet, Take 100 mg by mouth daily., Disp: , Rfl:    metoprolol tartrate (LOPRESSOR) 25 MG tablet, Take 1 tablet (25 mg total) by mouth 2 (two) times daily., Disp: 180 tablet, Rfl: 3   montelukast (SINGULAIR) 10 MG tablet, Take 10 mg by mouth daily., Disp: , Rfl:    Multiple Vitamin (MULTIVITAMIN WITH MINERALS) TABS tablet, Take 1 tablet by mouth daily., Disp: , Rfl:    niacin 500 MG tablet, Take 500 mg by mouth daily. , Disp: , Rfl:    Omega-3 Fatty Acids (FISH OIL) 1000 MG CAPS, Take 1,000 mg by mouth daily. , Disp: , Rfl:    omeprazole (PRILOSEC) 20 MG capsule, Take 1 capsule (20 mg total) by mouth 2 (two) times daily before a meal. (Patient taking differently: Take 20 mg by mouth daily.), Disp: 60 capsule, Rfl: 5   promethazine-dextromethorphan (PROMETHAZINE-DM) 6.25-15 MG/5ML syrup, Take 5 mLs by mouth at bedtime as needed for cough., Disp: 100 mL, Rfl: 0   Allergies  Allergen Reactions   Penicillins Other (  See Comments)    Causes patient to "pass out"DID THE REACTION INVOLVE: Swelling of the face/tongue/throat, SOB, or low BP? No Sudden or severe rash/hives, skin peeling, or the inside of the mouth or nose? No Did it require medical treatment? No When did it last happen?       If all above answers are "NO", may proceed with cephalosporin use.    Shellfish Allergy Hives, Shortness Of Breath and Swelling    Facial Swelling  ears turn red   Atorvastatin Other (See Comments)    Muscle pain and muscle weakness Other reaction(s): Muscle weakness   Simvastatin     Muscle pain and muscle weakness   Codeine Nausea And Vomiting     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[place])    Rupture of right triceps tendon 09/01/2015     Past Surgical History:  Procedure Laterality Date   BALLOON DILATION N/A 06/26/2021   Procedure: BALLOON DILATION;  Surgeon: Eloise Harman, DO;  [facility];  Service: Endoscopy;  Laterality: N/A;   COLONOSCOPY WITH PROPOFOL N/A 07/14/2020   diverticulosis, 5 polyps (the largest of which was 14 mm in the cecum) and recommended repeat in 1 year (tubular adenomas).    COLONOSCOPY WITH PROPOFOL N/A 06/26/2021   fair prep, non-bleeding internal hemorrhoids, sigmoid diverticulosis, two 4-7 mm polyps in cecum, one 2 mmp olyp in cecum. 3 year surveillance. Tubular adenoma.   ESOPHAGOGASTRODUODENOSCOPY (EGD) WITH PROPOFOL N/A 06/26/2021   moderate Shatzki ring, small hiatal hernia, gastritis s/p biopsy, normal duodenum. Negative H.pylori.   KNEE ARTHROPLASTY Left 10/2020   KNEE ARTHROSCOPY Right 1980s   NASAL SINUS SURGERY     POLYPECTOMY  07/14/2020   Procedure: POLYPECTOMY;  Surgeon: Eloise Harman, DO;  [facility];  Service: Endoscopy;;   POLYPECTOMY  06/26/2021   Procedure: POLYPECTOMY;  Surgeon: Eloise Harman, DO;  [facility];  Service: Endoscopy;;   PROSTATE SURGERY     TONSILLECTOMY  age 62   TRICEPS TENDON REPAIR Right 09/01/2015   Procedure: RIGHT TRICEPS  REPAIR;  Surgeon: Marchia Bond, MD;  Location: [place];  Service: Orthopedics;  Laterality: Right;    Family History  Problem Relation Age of Onset   Colon cancer Father 52   Allergic rhinitis Brother    Allergic rhinitis Brother    Asthma Maternal Uncle    Angioedema Neg Hx    Eczema Neg Hx    Urticaria Neg Hx    Immunodeficiency Neg Hx     Social History   Tobacco Use   Smoking status: Former    Packs/day: 3.00    Years: 20.00    Total pack  years: 60.00    Types: Cigarettes    Quit date: 08/30/1983    Years since quitting: 39.2   Smokeless tobacco: Former    Types: Chew    Quit date: 08/29/1985  Vaping Use   Vaping Use: Never used  Substance Use Topics   Alcohol use: Yes    Comment: 3 to 5 beers weekly   Drug use: No    ROS   Objective:   Vitals: BP 107/74 (BP Location: Right Arm)   Pulse (!) 108   Temp 99.8 F (37.7 C) (Oral)   Resp 18   SpO2 93%   Physical Exam  Constitutional:      General: He is not in acute distress.    Appearance: Normal appearance. He is well-developed and normal weight. He is not ill-appearing, toxic-appearing or diaphoretic.  HENT:     Head: Normocephalic and atraumatic.     Right Ear: External ear normal.     Left Ear: External ear normal.     Nose: Congestion present. No rhinorrhea.     Mouth/Throat:     Mouth: Mucous membranes are moist.     Pharynx: No oropharyngeal exudate or posterior oropharyngeal erythema.  Eyes:     General: No scleral icterus.       Right eye: No discharge.        Left eye: No discharge.     Extraocular Movements: Extraocular movements intact.  Cardiovascular:     Rate and Rhythm: Normal rate and regular rhythm.     Heart sounds: Normal heart sounds. No murmur heard.    No friction rub. No gallop.  Pulmonary:     Effort: Pulmonary effort is normal. No respiratory distress.     Breath sounds: Normal breath sounds. No stridor. No wheezing, rhonchi or rales.  Musculoskeletal:     Cervical back: Normal range of motion.  Neurological:     Mental Status: He is alert and oriented to person, place, and time.  Psychiatric:        Mood and Affect: Mood normal.        Behavior: Behavior normal.        Thought Content: Thought content normal.        Judgment: Judgment normal.     Assessment and Plan :   PDMP not reviewed this encounter.  1. Acute viral syndrome   2. Mild persistent chronic asthma without complication   3. Atrial fibrillation,  unspecified type [facility])     Deferred imaging given clear cardiopulmonary exam, hemodynamically stable vital signs.  In the context of his asthma and respiratory symptoms offered an oral prednisone course of 40 mg.  Despite him having atrial fibrillation suspect he would benefit from doing the prednisone.  Will manage for viral illness such as viral URI, viral syndrome, viral rhinitis, COVID-19. Recommended supportive care. Offered scripts for symptomatic relief. Testing is pending. Counseled patient on potential for adverse effects with medications prescribed/recommended today, ER and return-to-clinic precautions discussed, patient verbalized understanding.   If patient test positive for COVID-19, should undergo treatment with Paxlovid.   Jaynee Eagles, PA-C 11/26/22 1302

## 2022-11-27 LAB — SARS CORONAVIRUS 2 (TAT 6-24 HRS): SARS Coronavirus 2: NEGATIVE

## 2022-12-01 ENCOUNTER — Ambulatory Visit
Admission: EM | Admit: 2022-12-01 | Discharge: 2022-12-01 | Disposition: A | Discharge status: Home / Self Care | Payer: Medicare Other | Attending: Nurse Practitioner | Admitting: Nurse Practitioner

## 2022-12-01 ENCOUNTER — Ambulatory Visit (INDEPENDENT_AMBULATORY_CARE_PROVIDER_SITE_OTHER): Payer: Medicare Other

## 2022-12-01 DIAGNOSIS — R059 Cough, unspecified: Secondary | ICD-10-CM

## 2022-12-01 DIAGNOSIS — J069 Acute upper respiratory infection, unspecified: Secondary | ICD-10-CM

## 2022-12-01 MED ORDER — FLUTICASONE PROPIONATE 50 MCG/ACT NA SUSP: 2.0000 | Freq: Every day | NASAL | 0 refills | Status: AC

## 2022-12-01 MED ORDER — AZITHROMYCIN 250 MG PO TABS: 250.0000 mg | ORAL_TABLET | Freq: Every day | ORAL | 0 refills | Status: DC

## 2022-12-01 NOTE — Discharge Instructions (Signed)
The chest x-ray is negative for pneumonia. Take medication as prescribed.  Continue use of the cold medicine that was previously prescribed.  For daytime cough, you can take over-the-counter Mucinex. Increase fluids and allow for plenty of rest. Recommend using a humidifier in your bedroom at nighttime during sleep and sleeping elevated on pillows while cough symptoms persist. Warm salt water gargles 3-4 times daily for throat pain or discomfort. May use normal saline nasal spray throughout the day to help with nasal congestion. Please be advised that the cough may linger for several weeks.  If you are not feeling better, and you continue to have a nagging cough, recommend over-the-counter cough drops or throat lozenges with increase fluid intake.  If you develop new symptoms with the cough such as fever, wheezing, shortness of breath, or difficulty breathing, please follow-up with your primary care physician for further evaluation. Follow-up as needed.

## 2022-12-01 NOTE — ED Provider Notes (Signed)
[facility]    CSN: [id number] Arrival date & time: 12/01/22  4010      History   Chief Complaint Chief Complaint  Patient presents with   Cough    HPI Roberto Sanchez. is a 77 y.o. male.   The history is provided by the patient.   The patient presents for continued cough, sore throat, fever, nasal congestion.  Patient states the cough has been persistent for the last 2 to 3 weeks.  He states over the last several days, his other symptoms have worsened.  Patient reports that he did have fever that has since improved.  He does have underlying history of asthma as well.  Patient denies wheezing, abdominal pain, nausea, vomiting, or diarrhea.  Patient reports that he has been taking the prednisone and Promethazine DM that was prescribed earlier this week.  Patient's previous COVID test was negative.  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[place])    Rupture of right triceps tendon 09/01/2015    Patient Active Problem List   Diagnosis Date Noted   Acute respiratory failure with hypoxia ([place]) 11/01/2021   Atrial fibrillation with RVR ([place]) 10/31/2021   Community acquired pneumonia 10/31/2021   Elevated d-dimer 10/31/2021   Hyperlipidemia 10/31/2021   Severe sepsis ([place]) 10/29/2021   Pill dysphagia 04/05/2021   History of colonic polyps 01/31/2021   Bloating 07/25/2020   GERD (gastroesophageal reflux disease) 07/25/2020   Constipation 04/20/2020   Family history of colon cancer 04/20/2020   Rupture of right triceps tendon 09/01/2015   Anxiety 03/20/2015   Asthma, chronic    Vertigo 03/19/2015   Essential hypertension 03/19/2015   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    COLONOSCOPY WITH PROPOFOL N/A 06/26/2021   fair prep, non-bleeding internal hemorrhoids, sigmoid diverticulosis, two 4-7 mm polyps in cecum, one 2 mmp olyp in cecum. 3 year surveillance. Tubular adenoma.   ESOPHAGOGASTRODUODENOSCOPY (EGD) WITH PROPOFOL N/A 06/26/2021   moderate Shatzki ring, small hiatal hernia, gastritis s/p biopsy, normal duodenum. Negative H.pylori.   KNEE ARTHROPLASTY Left 10/2020   KNEE ARTHROSCOPY Right 1980s   NASAL SINUS SURGERY     POLYPECTOMY  07/14/2020   Procedure: POLYPECTOMY;  Surgeon: Eloise Harman, DO;  [facility];  Service: Endoscopy;;   POLYPECTOMY  06/26/2021   Procedure: POLYPECTOMY;  Surgeon: Eloise Harman, DO;  [facility];  Service: Endoscopy;;   PROSTATE SURGERY     TONSILLECTOMY  age 66   TRICEPS TENDON REPAIR Right 09/01/2015   Procedure: RIGHT TRICEPS  REPAIR;  Surgeon: Marchia Bond, MD;  Location: [facility];  Service: Orthopedics;  Laterality: Right;       Home Medications    Prior to Admission medications   Medication Sig Start Date End Date Taking? Authorizing  Provider  albuterol (VENTOLIN HFA) 108 (90 Base) MCG/ACT inhaler Inhale 2 puffs into the lungs every 6 (six) hours as needed for wheezing or shortness of breath. 03/16/08   [provider]  ALPRAZolam Duanne Moron) 0.5 MG tablet Take 0.5 mg by mouth at bedtime. 10/22/21   [provider]  Calcium Carb-Cholecalciferol (CALCIUM 600 + D PO) Take 1 tablet by mouth daily.    [provider]  cetirizine (ZYRTEC) 10 MG tablet Take 10 mg by mouth daily. 10/22/21    [provider]  chlorhexidine (PERIDEX) 0.12 % solution Use as directed 15 mLs in the mouth or throat 2 (two) times daily. 10/22/21   [provider]  diclofenac Sodium (VOLTAREN) 1 % GEL Apply 4 g topically 4 (four) times daily. 10/22/21   [provider]  doxycycline (VIBRA-TABS) 100 MG tablet Take 1 tablet (100 mg total) by mouth 2 (two) times daily. 08/28/22   Guerline Happ-Warren, Alda Lea, NP  DULoxetine (CYMBALTA) 20 MG capsule Take 20 mg by mouth at bedtime.    [provider]  ezetimibe (ZETIA) 10 MG tablet Take 10 mg by mouth daily.    [provider]  fluticasone (FLONASE) 50 MCG/ACT nasal spray Place 2 sprays into both nostrils daily. 08/28/22   Illyanna Petillo-Warren, Alda Lea, NP  Fluticasone-Salmeterol (ADVAIR) 250-50 MCG/DOSE AEPB Inhale 1 puff into the lungs every 12 (twelve) hours.    [provider]  guaifenesin (HUMIBID E) 400 MG TABS tablet Take 400 mg by mouth daily as needed (congestion).    [provider]  hydroxypropyl methylcellulose / hypromellose (ISOPTO TEARS / GONIOVISC) 2.5 % ophthalmic solution Place 1 drop into both eyes as needed for dry eyes.     [provider]  ipratropium-albuterol (DUONEB) 0.5-2.5 (3) MG/3ML SOLN Take 3 mLs by nebulization every 6 (six) hours as needed. 11/03/21   Mariel Aloe, MD  losartan (COZAAR) 100 MG tablet Take 100 mg by mouth daily.    [provider]  metoprolol tartrate (LOPRESSOR) 25 MG tablet Take 1 tablet (25 mg total) by mouth 2 (two) times daily. 05/01/22 04/26/23  Arnoldo Lenis, MD  montelukast (SINGULAIR) 10 MG tablet Take 10 mg by mouth daily. 03/18/15   [provider]  Multiple Vitamin (MULTIVITAMIN WITH MINERALS) TABS tablet Take 1 tablet by mouth daily.    [provider]  niacin 500 MG tablet Take 500 mg by mouth daily.     [provider]  Omega-3 Fatty Acids (FISH OIL) 1000 MG CAPS Take 1,000 mg by mouth daily.     [provider]  omeprazole (PRILOSEC) 20 MG capsule Take 1 capsule (20 mg total) by mouth 2 (two) times daily before a meal. Patient taking differently: Take 20 mg by mouth daily. 06/27/21 05/20/22  Mahala Menghini, PA-C  predniSONE (DELTASONE) 20 MG tablet Take 2 tablets (40 mg total) by mouth daily with breakfast. 11/26/22   Jaynee Eagles, PA-C  promethazine-dextromethorphan (PROMETHAZINE-DM) 6.25-15 MG/5ML syrup Take 5 mLs by mouth at bedtime as needed for cough. 11/26/22   Jaynee Eagles, PA-C    Family History Family History  Problem Relation Age of Onset   Colon cancer Father 7   Allergic rhinitis Brother    Allergic rhinitis Brother    Asthma Maternal Uncle    Angioedema Neg Hx    Eczema Neg Hx    Urticaria Neg Hx    Immunodeficiency Neg Hx     Social History Social History   Tobacco  Use   Smoking status: Former    Packs/day: 3.00    Years: 20.00    Total pack years: 60.00    Types: Cigarettes    Quit date: 08/30/1983    Years since quitting: 39.2   Smokeless tobacco: Former    Types: Chew    Quit date: 08/29/1985  Vaping Use   Vaping Use: Never used  Substance Use Topics   Alcohol use: Yes    Comment: 3 to 5 beers weekly   Drug use: No     Allergies   Penicillins, Shellfish allergy, Atorvastatin, Simvastatin, and Codeine   Review of Systems Review of Systems Per HPI  Physical Exam Triage Vital Signs ED Triage Vitals  Enc Vitals Group     BP 12/01/22 0858 95/69     Pulse Rate 12/01/22 0858 88     Resp 12/01/22 0858 19     Temp 12/01/22 0858 98.5 F (36.9 C)     Temp Source 12/01/22 0858 Oral     SpO2 12/01/22 0858 94 %     Weight --      Height --      Head Circumference --      Peak Flow --      Pain Score 12/01/22 0859 6     Pain Loc --      Pain Edu? --      Excl. in [place]? --    No data found.  Updated Vital Signs BP 95/69 (BP Location: Right Arm)   Pulse 88   Temp 98.5 F (36.9 C) (Oral)   Resp 19   SpO2 94%   Visual Acuity Right Eye  Distance:   Left Eye Distance:   Bilateral Distance:    Right Eye Near:   Left Eye Near:    Bilateral Near:     Physical Exam Vitals and nursing note reviewed.  Constitutional:      General: He is not in acute distress.    Appearance: Normal appearance. He is well-developed.  HENT:     Head: Normocephalic and atraumatic.     Nose: Congestion and rhinorrhea present.     Right Turbinates: Enlarged and swollen.     Left Turbinates: Enlarged and swollen.     Right Sinus: No maxillary sinus tenderness or frontal sinus tenderness.     Left Sinus: No maxillary sinus tenderness or frontal sinus tenderness.     Mouth/Throat:     Lips: Pink.     Mouth: Mucous membranes are moist.     Pharynx: Oropharynx is clear. Uvula midline. Posterior oropharyngeal erythema present. No pharyngeal swelling.  Eyes:     Extraocular Movements: Extraocular movements intact.     Conjunctiva/sclera: Conjunctivae normal.     Pupils: Pupils are equal, round, and reactive to light.  Neck:     Thyroid: No thyromegaly.     Trachea: No tracheal deviation.  Cardiovascular:     Rate and Rhythm: Normal rate and regular rhythm.     Pulses: Normal pulses.     Heart sounds: Normal heart sounds.  Pulmonary:     Effort: Pulmonary effort is normal.     Breath sounds: Normal breath sounds.  Abdominal:     General: Bowel sounds are normal. There is no distension.     Palpations: Abdomen is soft.     Tenderness: There is no abdominal tenderness.  Musculoskeletal:     Cervical back: Normal range of motion and neck supple.  Skin:    General:  Skin is warm and dry.  Neurological:     General: No focal deficit present.     Mental Status: He is alert and oriented to person, place, and time.  Psychiatric:        Mood and Affect: Mood normal.        Behavior: Behavior normal.        Thought Content: Thought content normal.        Judgment: Judgment normal.      UC Treatments / Results  Labs (all labs ordered are  listed, but only abnormal results are displayed) Labs Reviewed - No data to display  EKG   Radiology DG Chest 2 View  Result Date: 12/01/2022 CLINICAL DATA:  Cough.  History of asthma. EXAM: CHEST - 2 VIEW COMPARISON:  Chest x-ray November 02, 2021. Chest CT January 11, 2022. FINDINGS: Biapical pleuroparenchymal thickening identified and symmetric. Mild atelectasis in the left base. No suspicious infiltrate. No effusion identified. The cardiomediastinal silhouette is stable. No other acute abnormalities. IMPRESSION: No active cardiopulmonary disease. Electronically Signed   By: Dorise Bullion III M.D.   On: 12/01/2022 10:10    Procedures Procedures (including critical care time)  Medications Ordered in UC Medications - No data to display  Initial Impression / Assessment and Plan / UC Course  I have reviewed the triage vital signs and the nursing notes.  Pertinent labs & imaging results that were available during my care of the patient were reviewed by me and considered in my medical decision making (see chart for details).  The patient is well-appearing, he is in no acute distress, vital signs are stable.  Chest x-ray is negative for any cardiopulmonary disease.  Symptoms consistent with an acute upper respiratory infection.  COVID test is not indicated as patient had a negative home COVID test.  Because patient has had cough for more than 10 days, and symptoms appear to be worsening despite previous treatment, will start him on azithromycin 250 mg.  Patient was recently prescribed Promethazine DM which he was advised to continue for the cough.  Also recommended over-the-counter Mucinex for his continued cough.  Patient was given supportive care recommendations to include increasing fluids, allowing for plenty of rest, use of a humidifier in his bedroom at nighttime during sleep.  Discussed indications of when follow-up may be necessary.  Patient verbalizes understanding.  All questions were  answered.  Patient stable for discharge.   Final Clinical Impressions(s) / UC Diagnoses   Final diagnoses:  None   Discharge Instructions   None    ED Prescriptions   None    PDMP not reviewed this encounter.   Tish Men, NP 12/01/22 1021

## 2022-12-01 NOTE — ED Triage Notes (Signed)
Pt reports cough 2 1/2 weeks; sore throat, nasal congestion x 2-3 days

## 2022-12-09 ENCOUNTER — Ambulatory Visit
Admission: EM | Admit: 2022-12-09 | Discharge: 2022-12-09 | Disposition: A | Discharge status: Home / Self Care | Payer: No Typology Code available for payment source | Attending: Nurse Practitioner | Admitting: Nurse Practitioner

## 2022-12-09 DIAGNOSIS — R058 Other specified cough: Secondary | ICD-10-CM | POA: Diagnosis not present

## 2022-12-09 MED ORDER — BENZONATATE 100 MG PO CAPS: 100.0000 mg | ORAL_CAPSULE | Freq: Three times a day (TID) | ORAL | 0 refills | Status: DC | PRN

## 2022-12-09 NOTE — ED Triage Notes (Signed)
Cough and nasal congestion that pt states he had for 4 weeks now and nothing will help it go away. States it gets worse at night when he lays down. Taking OTC cough medication and prescribed medication when he was here last week.

## 2022-12-09 NOTE — Discharge Instructions (Addendum)
I suspect you have a post viral cough.  This should improve over the next couple of weeks.  Start using flonase nasal spray twice per day to help prevent drainage going down the back of your throat.  Take the Tessalon perles every 8 hours as needed for the dry cough.

## 2022-12-09 NOTE — ED Provider Notes (Signed)
[facility]    CSN: [id number] Arrival date & time: 12/09/22  2595      History   Chief Complaint Chief Complaint  Patient presents with   Cough   Nasal Congestion    HPI Roberto Sanchez. is a 77 y.o. male.   Patient presents today for 4-week history of cough.  Reports initially, he had sore throat, loss of congestion, which have now resolved.  He endorses a dry cough that is ongoing, runny nose, clear nasal drainage, postnasal drainage, headache, and fatigue.  Reports he is not sleeping well at night because he coughs so much.  He is concerned because his wife is having "cancer surgery" early next week and he "wants to feel better."  Today, he denies recent fever, body aches or chills.  No shortness of breath, wheezing, chest pain, chest tightness, chest or nasal congestion.  No sore throat, tooth or ear pain, abdominal pain, nausea/vomiting, diarrhea, decreased appetite.  Has taken a round of prednisone, azithromycin, over-the-counter cough and cold medications, Flonase nasal spray without benefit.  He was seen in urgent care on 11/26/2022 and 12/01/2022 for the same/similar symptoms which he reports are not fully better.    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[place])    Rupture of right triceps tendon 09/01/2015    Patient Active Problem List   Diagnosis Date Noted   Acute respiratory failure with hypoxia ([place]) 11/01/2021   Atrial fibrillation with RVR ([place]) 10/31/2021   Community acquired pneumonia 10/31/2021   Elevated d-dimer 10/31/2021   Hyperlipidemia 10/31/2021   Severe sepsis ([place]) 10/29/2021   Pill dysphagia 04/05/2021   History of colonic polyps 01/31/2021   Bloating 07/25/2020   GERD (gastroesophageal reflux disease) 07/25/2020   Constipation 04/20/2020   Family history of colon cancer 04/20/2020   Rupture of right triceps tendon  09/01/2015   Anxiety 03/20/2015   Asthma, chronic    Vertigo 03/19/2015   Essential hypertension 03/19/2015   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    COLONOSCOPY WITH PROPOFOL N/A 06/26/2021   fair prep, non-bleeding internal hemorrhoids, sigmoid diverticulosis, two 4-7 mm polyps in cecum, one 2 mmp olyp in cecum. 3 year surveillance. Tubular adenoma.   ESOPHAGOGASTRODUODENOSCOPY (EGD) WITH PROPOFOL N/A 06/26/2021   moderate Shatzki ring, small hiatal hernia, gastritis s/p biopsy, normal duodenum. Negative H.pylori.   KNEE ARTHROPLASTY Left 10/2020   KNEE ARTHROSCOPY Right 1980s   NASAL SINUS SURGERY     POLYPECTOMY  07/14/2020   Procedure: POLYPECTOMY;  Surgeon: Eloise Harman, DO;  [facility];  Service: Endoscopy;;   POLYPECTOMY  06/26/2021   Procedure: POLYPECTOMY;  Surgeon: Eloise Harman, DO;  [facility];  Service: Endoscopy;;   PROSTATE SURGERY     TONSILLECTOMY  age 31   TRICEPS  TENDON REPAIR Right 09/01/2015   Procedure: RIGHT TRICEPS  REPAIR;  Surgeon: Marchia Bond, MD;  Location: [facility];  Service: Orthopedics;  Laterality: Right;       Home Medications    Prior to Admission medications   Medication Sig Start Date End Date Taking? Authorizing Provider  albuterol (VENTOLIN HFA) 108 (90 Base) MCG/ACT inhaler Inhale 2 puffs into the lungs every 6 (six) hours as needed for wheezing or  shortness of breath. 03/16/08  Yes [provider]  ALPRAZolam (XANAX) 0.5 MG tablet Take 0.5 mg by mouth at bedtime. 10/22/21  Yes [provider]  benzonatate (TESSALON) 100 MG capsule Take 1-2 capsules (100-200 mg total) by mouth 3 (three) times daily as needed for cough. Do not take with alcohol or while driving or operating heavy machinery.  May cause drowsiness. 12/09/22  Yes Noemi Chapel A, NP  Calcium Carb-Cholecalciferol (CALCIUM 600 + D PO) Take 1 tablet by mouth daily.   Yes [provider]  cetirizine (ZYRTEC) 10 MG tablet Take 10 mg by mouth daily. 10/22/21  Yes [provider]  diclofenac Sodium (VOLTAREN) 1 % GEL Apply 4 g topically 4 (four) times daily. 10/22/21  Yes [provider]  DULoxetine (CYMBALTA) 20 MG capsule Take 20 mg by mouth at bedtime.   Yes [provider]  ezetimibe (ZETIA) 10 MG tablet Take 10 mg by mouth daily.   Yes [provider]  fluticasone (FLONASE) 50 MCG/ACT nasal spray Place 2 sprays into both nostrils daily. 12/01/22  Yes Leath-Warren, Alda Lea, NP  Fluticasone-Salmeterol (ADVAIR) 250-50 MCG/DOSE AEPB Inhale 1 puff into the lungs every 12 (twelve) hours.   Yes [provider]  guaifenesin (HUMIBID E) 400 MG TABS tablet Take 400 mg by mouth daily as needed (congestion).   Yes [provider]  hydroxypropyl methylcellulose / hypromellose (ISOPTO TEARS / GONIOVISC) 2.5 % ophthalmic solution Place 1 drop into both eyes as needed for dry eyes.    Yes [provider]  ipratropium-albuterol (DUONEB) 0.5-2.5 (3) MG/3ML SOLN Take 3 mLs by nebulization every 6 (six) hours as needed. 11/03/21  Yes Mariel Aloe, MD  losartan (COZAAR) 100 MG tablet Take 100 mg by mouth daily.   Yes [provider]  metoprolol tartrate (LOPRESSOR) 25 MG tablet Take 1 tablet (25 mg total) by mouth 2 (two) times daily. 05/01/22 04/26/23 Yes Branch, Alphonse Guild, MD  montelukast (SINGULAIR) 10  MG tablet Take 10 mg by mouth daily. 03/18/15  Yes [provider]  Multiple Vitamin (MULTIVITAMIN WITH MINERALS) TABS tablet Take 1 tablet by mouth daily.   Yes [provider]  niacin 500 MG tablet Take 500 mg by mouth daily.    Yes [provider]  Omega-3 Fatty Acids (FISH OIL) 1000 MG CAPS Take 1,000 mg by mouth daily.    Yes [provider]  predniSONE (DELTASONE) 20 MG tablet Take 2 tablets (40 mg total) by mouth daily with breakfast. 11/26/22  Yes Jaynee Eagles, PA-C  promethazine-dextromethorphan (PROMETHAZINE-DM) 6.25-15 MG/5ML syrup Take 5 mLs by mouth at bedtime as needed for cough. 11/26/22  Yes Jaynee Eagles, PA-C  omeprazole (PRILOSEC) 20 MG capsule Take 1 capsule (20 mg total) by mouth 2 (two) times daily before a meal. Patient taking differently: Take 20 mg by mouth daily. 06/27/21 05/20/22  Mahala Menghini, PA-C    Family History Family History  Problem Relation Age of Onset   Colon cancer Father 55   Allergic rhinitis Brother  Allergic rhinitis Brother    Asthma Maternal Uncle    Angioedema Neg Hx    Eczema Neg Hx    Urticaria Neg Hx    Immunodeficiency Neg Hx     Social History Social History   Tobacco Use   Smoking status: Former    Packs/day: 3.00    Years: 20.00    Total pack years: 60.00    Types: Cigarettes    Quit date: 08/30/1983    Years since quitting: 39.3   Smokeless tobacco: Former    Types: Chew    Quit date: 08/29/1985  Vaping Use   Vaping Use: Never used  Substance Use Topics   Alcohol use: Yes    Comment: 3 to 5 beers weekly   Drug use: No     Allergies   Penicillins, Shellfish allergy, Atorvastatin, Simvastatin, and Codeine   Review of Systems Review of Systems Per HPI  Physical Exam Triage Vital Signs ED Triage Vitals [12/09/22 1000]  Enc Vitals Group     BP (!) 153/75     Pulse Rate 70     Resp 18     Temp 98 F (36.7 C)     Temp Source Oral     SpO2 98 %     Weight      Height      Head  Circumference      Peak Flow      Pain Score 0     Pain Loc      Pain Edu?      Excl. in [place]?    No data found.  Updated Vital Signs BP (!) 153/75 (BP Location: Right Arm)   Pulse 70   Temp 98 F (36.7 C) (Oral)   Resp 18   SpO2 98%   Visual Acuity Right Eye Distance:   Left Eye Distance:   Bilateral Distance:    Right Eye Near:   Left Eye Near:    Bilateral Near:     Physical Exam Vitals and nursing note reviewed.  Constitutional:      General: He is not in acute distress.    Appearance: Normal appearance. He is not ill-appearing or toxic-appearing.  HENT:     Head: Normocephalic and atraumatic.     Right Ear: Tympanic membrane, ear canal and external ear normal. There is impacted cerumen. Tympanic membrane is not perforated, erythematous or retracted.     Left Ear: Tympanic membrane, ear canal and external ear normal. There is impacted cerumen. Tympanic membrane is not perforated, erythematous or retracted.     Nose: Congestion present. No rhinorrhea.     Right Sinus: Maxillary sinus tenderness and frontal sinus tenderness present.     Left Sinus: Maxillary sinus tenderness and frontal sinus tenderness present.     Mouth/Throat:     Mouth: Mucous membranes are moist.     Pharynx: Oropharynx is clear. No oropharyngeal exudate or posterior oropharyngeal erythema.  Eyes:     General: No scleral icterus.    Extraocular Movements: Extraocular movements intact.  Cardiovascular:     Rate and Rhythm: Normal rate and regular rhythm.  Pulmonary:     Effort: Pulmonary effort is normal. No respiratory distress.     Breath sounds: Normal breath sounds. No wheezing, rhonchi or rales.  Abdominal:     General: Abdomen is flat. Bowel sounds are normal. There is no distension.     Palpations: Abdomen is soft.     Tenderness: There is no abdominal tenderness.  There is no guarding.  Musculoskeletal:     Cervical back: Normal range of motion and neck supple.  Lymphadenopathy:      Cervical: No cervical adenopathy.  Skin:    General: Skin is warm and dry.     Coloration: Skin is not jaundiced or pale.     Findings: No erythema or rash.  Neurological:     Mental Status: He is alert and oriented to person, place, and time.  Psychiatric:        Behavior: Behavior is cooperative.      UC Treatments / Results  Labs (all labs ordered are listed, but only abnormal results are displayed) Labs Reviewed - No data to display  EKG   Radiology No results found.  Procedures Procedures (including critical care time)  Medications Ordered in UC Medications - No data to display  Initial Impression / Assessment and Plan / UC Course  I have reviewed the triage vital signs and the nursing notes.  Pertinent labs & imaging results that were available during my care of the patient were reviewed by me and considered in my medical decision making (see chart for details).   Patient is well-appearing, normotensive, afebrile, not tachycardic, not tachypneic, oxygenating well on room air.    Post-viral cough syndrome Suspect postviral cough syndrome -vital signs are stable, lung sounds are clear to auscultation Low suspicion for bacterial sinusitis given no recent fever Treat with Tessalon Perles, twice daily Flonase nasal spray Recommended follow-up with primary care provider if no improvement or worsening symptoms despite treatment  The patient was given the opportunity to ask questions.  All questions answered to their satisfaction.  The patient is in agreement to this plan.    Final Clinical Impressions(s) / UC Diagnoses   Final diagnoses:  Post-viral cough syndrome     Discharge Instructions      I suspect you have a post viral cough.  This should improve over the next couple of weeks.  Start using flonase nasal spray twice per day to help prevent drainage going down the back of your throat.  Take the Tessalon perles every 8 hours as needed for the dry cough.       ED Prescriptions     Medication Sig Dispense Auth. Provider   benzonatate (TESSALON) 100 MG capsule Take 1-2 capsules (100-200 mg total) by mouth 3 (three) times daily as needed for cough. Do not take with alcohol or while driving or operating heavy machinery.  May cause drowsiness. 30 capsule Eulogio Bear, NP      PDMP not reviewed this encounter.   Eulogio Bear, NP 12/09/22 1029

## 2023-05-18 ENCOUNTER — Ambulatory Visit
Admission: EM | Admit: 2023-05-18 | Discharge: 2023-05-18 | Disposition: A | Discharge status: Home / Self Care | Payer: No Typology Code available for payment source | Attending: Nurse Practitioner | Admitting: Nurse Practitioner

## 2023-05-18 DIAGNOSIS — J0110 Acute frontal sinusitis, unspecified: Secondary | ICD-10-CM

## 2023-05-18 DIAGNOSIS — J4521 Mild intermittent asthma with (acute) exacerbation: Secondary | ICD-10-CM

## 2023-05-18 MED ORDER — PROMETHAZINE-DM 6.25-15 MG/5ML PO SYRP: 5.0000 mL | ORAL_SOLUTION | Freq: Every evening | ORAL | 0 refills | Status: DC | PRN

## 2023-05-18 MED ORDER — PREDNISONE 20 MG PO TABS
40.0000 mg | ORAL_TABLET | Freq: Every day | ORAL | 0 refills | Status: AC
Start: 2023-05-18 — End: 2023-05-23

## 2023-05-18 MED ORDER — DOXYCYCLINE HYCLATE 100 MG PO TABS: 100.0000 mg | ORAL_TABLET | Freq: Two times a day (BID) | ORAL | 0 refills | Status: AC

## 2023-05-18 NOTE — ED Triage Notes (Signed)
Pt c/o congestion, cough that makes him light headed woke up with a sire thriat that got better after drinking, headache and coughing up yellow colored mucus, fatigue  x 2 weeks has gotten progressively worse of the course of the 2 weeks

## 2023-05-18 NOTE — Discharge Instructions (Addendum)
Take medication as directed. Continue your current allergy medication regimen.  Continue use of your albuterol inhaler and nebulizer as needed for wheezing or shortness of breath. Increase fluids and get plenty of rest. May take over-the-counter Tylenol as needed for pain, fever, or general discomfort. Recommend normal saline nasal spray to help with nasal congestion throughout the day. Warm salt water gargles 3-4 times daily as needed for throat pain or discomfort. For your cough, it may be helpful to use a humidifier at bedtime during sleep. If symptoms do not improve with this treatment, please follow-up with your primary care physician for further evaluation. Follow-up as needed.

## 2023-05-18 NOTE — ED Provider Notes (Signed)
[facility]    CSN: [id number] Arrival date & time: 05/18/23  0955      History   Chief Complaint No chief complaint on file.   HPI Roberto Sanchez. is a 77 y.o. male.   The history is provided by the patient.   The patient presents for complaints of fatigue, headache, nasal congestion, runny nose, and cough.  Patient states this morning, he woke up with a sore throat, that has improved, but he can tell it is still present. Symptoms have been present for the past 2 weeks.  Patient also endorses intermittent wheezing, as he has an underlying history of asthma.  Patient denies fever, chills, ear pain, ear drainage, shortness of breath, chest pain, abdominal pain, nausea, vomiting, or diarrhea.  Patient reports that he has been using his albuterol inhaler at least twice daily over the last several days.  Patient endorses history of seasonal allergies, and takes Flonase daily.  He states that he has been taking Mucinex for his symptoms with minimal relief.  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HCC)    Rupture of right triceps tendon 09/01/2015    Patient Active Problem List   Diagnosis Date Noted   Acute respiratory failure with hypoxia (HCC) 11/01/2021   Atrial fibrillation with RVR (HCC) 10/31/2021   Community acquired pneumonia 10/31/2021   Elevated d-dimer 10/31/2021   Hyperlipidemia 10/31/2021   Severe sepsis (HCC) 10/29/2021   Pill dysphagia 04/05/2021   History of colonic polyps 01/31/2021   Bloating 07/25/2020   GERD (gastroesophageal reflux disease) 07/25/2020   Constipation 04/20/2020   Family history of colon cancer 04/20/2020   Rupture of right triceps tendon 09/01/2015   Anxiety 03/20/2015   Asthma, chronic    Vertigo 03/19/2015   Essential hypertension 03/19/2015   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    COLONOSCOPY WITH PROPOFOL N/A 06/26/2021   fair prep, non-bleeding internal hemorrhoids, sigmoid diverticulosis, two 4-7 mm polyps in cecum, one 2 mmp olyp in cecum. 3 year surveillance. Tubular adenoma.   ESOPHAGOGASTRODUODENOSCOPY (EGD) WITH PROPOFOL N/A 06/26/2021   moderate Shatzki ring, small hiatal hernia, gastritis s/p biopsy, normal duodenum. Negative H.pylori.   KNEE ARTHROPLASTY Left 10/2020   KNEE ARTHROSCOPY Right 1980s   NASAL SINUS SURGERY     POLYPECTOMY  07/14/2020   Procedure: POLYPECTOMY;  Surgeon: Lanelle Bal, DO;  Location: [facility];  Service: Endoscopy;;   POLYPECTOMY  06/26/2021   Procedure: POLYPECTOMY;  Surgeon: Lanelle Bal, DO;  Location: [facility];  Service: Endoscopy;;   PROSTATE SURGERY     TONSILLECTOMY  age 68   TRICEPS TENDON REPAIR Right 09/01/2015   Procedure: RIGHT TRICEPS  REPAIR;  Surgeon: Teryl Lucy, MD;  Location: [facility];  Service: Orthopedics;  Laterality: Right;  Home Medications    Prior to Admission medications   Medication Sig Start Date End Date Taking? Authorizing Provider  doxycycline (VIBRA-TABS) 100 MG tablet Take 1 tablet (100 mg total) by mouth 2 (two) times daily for 7 days. 05/18/23 05/25/23 Yes Mickie Kozikowski-Warren, Sadie Haber, NP  predniSONE (DELTASONE) 20 MG tablet Take 2 tablets (40 mg total) by mouth daily with breakfast for 5 days. 05/18/23 05/23/23 Yes Maverick Patman-Warren, Sadie Haber, NP   promethazine-dextromethorphan (PROMETHAZINE-DM) 6.25-15 MG/5ML syrup Take 5 mLs by mouth at bedtime as needed for cough. 05/18/23  Yes Mahlani Berninger-Warren, Sadie Haber, NP  albuterol (VENTOLIN HFA) 108 (90 Base) MCG/ACT inhaler Inhale 2 puffs into the lungs every 6 (six) hours as needed for wheezing or shortness of breath. 03/16/08   [provider]  ALPRAZolam Prudy Feeler) 0.5 MG tablet Take 0.5 mg by mouth at bedtime. 10/22/21   [provider]  benzonatate (TESSALON) 100 MG capsule Take 1-2 capsules (100-200 mg total) by mouth 3 (three) times daily as needed for cough. Do not take with alcohol or while driving or operating heavy machinery.  May cause drowsiness. 12/09/22   Valentino Nose, NP  Calcium Carb-Cholecalciferol (CALCIUM 600 + D PO) Take 1 tablet by mouth daily.    [provider]  cetirizine (ZYRTEC) 10 MG tablet Take 10 mg by mouth daily. 10/22/21   [provider]  diclofenac Sodium (VOLTAREN) 1 % GEL Apply 4 g topically 4 (four) times daily. 10/22/21   [provider]  DULoxetine (CYMBALTA) 20 MG capsule Take 20 mg by mouth at bedtime.    [provider]  ezetimibe (ZETIA) 10 MG tablet Take 10 mg by mouth daily.    [provider]  fluticasone (FLONASE) 50 MCG/ACT nasal spray Place 2 sprays into both nostrils daily. 12/01/22   Shamal Stracener-Warren, Sadie Haber, NP  Fluticasone-Salmeterol (ADVAIR) 250-50 MCG/DOSE AEPB Inhale 1 puff into the lungs every 12 (twelve) hours.    [provider]  guaifenesin (HUMIBID E) 400 MG TABS tablet Take 400 mg by mouth daily as needed (congestion).    [provider]  hydroxypropyl methylcellulose / hypromellose (ISOPTO TEARS / GONIOVISC) 2.5 % ophthalmic solution Place 1 drop into both eyes as needed for dry eyes.     [provider]  ipratropium-albuterol (DUONEB) 0.5-2.5 (3) MG/3ML SOLN Take 3 mLs by nebulization every 6 (six) hours as needed. 11/03/21   Narda Bonds, MD   losartan (COZAAR) 100 MG tablet Take 100 mg by mouth daily.    [provider]  metoprolol tartrate (LOPRESSOR) 25 MG tablet Take 1 tablet (25 mg total) by mouth 2 (two) times daily. 05/01/22 04/26/23  Antoine Poche, MD  montelukast (SINGULAIR) 10 MG tablet Take 10 mg by mouth daily. 03/18/15   [provider]  Multiple Vitamin (MULTIVITAMIN WITH MINERALS) TABS tablet Take 1 tablet by mouth daily.    [provider]  niacin 500 MG tablet Take 500 mg by mouth daily.     [provider]  Omega-3 Fatty Acids (FISH OIL) 1000 MG CAPS Take 1,000 mg by mouth daily.     [provider]  omeprazole (PRILOSEC) 20 MG capsule Take 1 capsule (20 mg total) by mouth 2 (two) times daily before a meal. Patient taking differently: Take 20 mg by mouth daily. 06/27/21 05/20/22  Tiffany Kocher, PA-C    Family History Family History  Problem Relation Age of Onset   Colon cancer Father 52   Allergic rhinitis Brother  Allergic rhinitis Brother    Asthma Maternal Uncle    Angioedema Neg Hx    Eczema Neg Hx    Urticaria Neg Hx    Immunodeficiency Neg Hx     Social History Social History   Tobacco Use   Smoking status: Former    Packs/day: 3.00    Years: 20.00    Additional pack years: 0.00    Total pack years: 60.00    Types: Cigarettes    Quit date: 08/30/1983    Years since quitting: 39.7   Smokeless tobacco: Former    Types: Chew    Quit date: 08/29/1985  Vaping Use   Vaping Use: Never used  Substance Use Topics   Alcohol use: Yes    Comment: 3 to 5 beers weekly   Drug use: No     Allergies   Penicillins, Shellfish allergy, Atorvastatin, Simvastatin, and Codeine   Review of Systems Review of Systems Per HPI  Physical Exam Triage Vital Signs ED Triage Vitals  Enc Vitals Group     BP 05/18/23 0959 131/76     Pulse Rate 05/18/23 0959 84     Resp 05/18/23 0959 14     Temp 05/18/23 0959 98.1 F (36.7 C)     Temp src --      SpO2  05/18/23 0959 94 %     Weight --      Height --      Head Circumference --      Peak Flow --      Pain Score 05/18/23 1002 7     Pain Loc --      Pain Edu? --      Excl. in GC? --    No data found.  Updated Vital Signs BP 131/76   Pulse 84   Temp 98.1 F (36.7 C)   Resp 14   SpO2 94%   Visual Acuity Right Eye Distance:   Left Eye Distance:   Bilateral Distance:    Right Eye Near:   Left Eye Near:    Bilateral Near:     Physical Exam Vitals and nursing note reviewed.  Constitutional:      Appearance: Normal appearance. He is well-developed.  HENT:     Head: Normocephalic and atraumatic.     Right Ear: Tympanic membrane, ear canal and external ear normal.     Left Ear: Tympanic membrane, ear canal and external ear normal.     Nose: Congestion present. No rhinorrhea.     Right Turbinates: Enlarged and swollen.     Left Turbinates: Enlarged and swollen.     Right Sinus: Frontal sinus tenderness present. No maxillary sinus tenderness.     Left Sinus: Frontal sinus tenderness present. No maxillary sinus tenderness.     Mouth/Throat:     Lips: Pink.     Mouth: Mucous membranes are moist.     Pharynx: Oropharynx is clear. Uvula midline. Posterior oropharyngeal erythema present. No pharyngeal swelling or oropharyngeal exudate.     Comments: Cobblestoning present to posterior oropharynx Eyes:     Extraocular Movements: Extraocular movements intact.     Conjunctiva/sclera: Conjunctivae normal.     Pupils: Pupils are equal, round, and reactive to light.  Neck:     Thyroid: No thyromegaly.     Trachea: No tracheal deviation.  Cardiovascular:     Rate and Rhythm: Normal rate and regular rhythm.     Pulses: Normal pulses.     Heart sounds: Normal heart  sounds.  Pulmonary:     Effort: Pulmonary effort is normal. No respiratory distress.     Breath sounds: Normal breath sounds. No stridor. No wheezing, rhonchi or rales.  Abdominal:     General: Bowel sounds are normal.      Palpations: Abdomen is soft.  Musculoskeletal:     Cervical back: Normal range of motion.  Skin:    General: Skin is warm and dry.  Neurological:     General: No focal deficit present.     Mental Status: He is alert and oriented to person, place, and time.  Psychiatric:        Mood and Affect: Mood normal.        Behavior: Behavior normal.        Thought Content: Thought content normal.        Judgment: Judgment normal.      UC Treatments / Results  Labs (all labs ordered are listed, but only abnormal results are displayed) Labs Reviewed - No data to display  EKG   Radiology No results found.  Procedures Procedures (including critical care time)  Medications Ordered in UC Medications - No data to display  Initial Impression / Assessment and Plan / UC Course  I have reviewed the triage vital signs and the nursing notes.  Pertinent labs & imaging results that were available during my care of the patient were reviewed by me and considered in my medical decision making (see chart for details).  Symptoms appear to be consistent with an acute frontal sinusitis.  Symptoms appear to also be exacerbated by patient's underlying history of asthma.  Will treat patient's sinus infection with doxycycline 100 mg, and for his cough, Promethazine DM.  For patient's asthma exacerbation, prednisone 40 mg was prescribed.  Supportive care recommendations were provided and discussed with the patient to include increasing fluids, allowing for plenty of rest, use of over-the-counter analgesics such as Tylenol, and use of a humidifier in his bedroom at nighttime during sleep.  Patient was advised to follow-up with his PCP if symptoms do not improve with this treatment.  Patient was in agreement with this plan of care and verbalizes understanding.  All questions were answered.  Patient stable for discharge.   Final Clinical Impressions(s) / UC Diagnoses   Final diagnoses:  Acute frontal  sinusitis, recurrence not specified  Mild intermittent asthma with acute exacerbation     Discharge Instructions      Take medication as directed. Continue your current allergy medication regimen.  Continue use of your albuterol inhaler and nebulizer as needed for wheezing or shortness of breath. Increase fluids and get plenty of rest. May take over-the-counter Tylenol as needed for pain, fever, or general discomfort. Recommend normal saline nasal spray to help with nasal congestion throughout the day. Warm salt water gargles 3-4 times daily as needed for throat pain or discomfort. For your cough, it may be helpful to use a humidifier at bedtime during sleep. If symptoms do not improve with this treatment, please follow-up with your primary care physician for further evaluation. Follow-up as needed.     ED Prescriptions     Medication Sig Dispense Auth. Provider   predniSONE (DELTASONE) 20 MG tablet Take 2 tablets (40 mg total) by mouth daily with breakfast for 5 days. 10 tablet Syndi Pua-Warren, Sadie Haber, NP   doxycycline (VIBRA-TABS) 100 MG tablet Take 1 tablet (100 mg total) by mouth 2 (two) times daily for 7 days. 14 tablet Darris Carachure-Warren, Sadie Haber, NP  promethazine-dextromethorphan (PROMETHAZINE-DM) 6.25-15 MG/5ML syrup Take 5 mLs by mouth at bedtime as needed for cough. 75 mL Josi Roediger-Warren, Sadie Haber, NP      PDMP not reviewed this encounter.   Abran Cantor, NP 05/18/23 [phone]

## 2023-09-09 ENCOUNTER — Ambulatory Visit
Admission: EM | Admit: 2023-09-09 | Discharge: 2023-09-09 | Disposition: A | Discharge status: Home / Self Care | Payer: Medicare Other | Attending: Family Medicine | Admitting: Family Medicine

## 2023-09-09 DIAGNOSIS — J4541 Moderate persistent asthma with (acute) exacerbation: Secondary | ICD-10-CM

## 2023-09-09 DIAGNOSIS — J0100 Acute maxillary sinusitis, unspecified: Secondary | ICD-10-CM

## 2023-09-09 MED ORDER — PROMETHAZINE-DM 6.25-15 MG/5ML PO SYRP: 5.0000 mL | ORAL_SOLUTION | Freq: Four times a day (QID) | ORAL | 0 refills | Status: DC | PRN

## 2023-09-09 MED ORDER — AZITHROMYCIN 250 MG PO TABS: ORAL_TABLET | ORAL | 0 refills | Status: DC

## 2023-09-09 MED ORDER — DEXAMETHASONE SODIUM PHOSPHATE 10 MG/ML IJ SOLN
10.0000 mg | Freq: Once | INTRAMUSCULAR | Status: AC
  Administered 2023-09-09: 10 mg via INTRAMUSCULAR

## 2023-09-09 NOTE — ED Provider Notes (Signed)
[facility]    CSN: [id number] Arrival date & time: 09/09/23  1018      History   Chief Complaint No chief complaint on file.   HPI Roberto Sanchez. is a 77 y.o. male.   Patient presenting today with 2-week history of progressively worsening cough, wheezing, chest tightness, nasal congestion, facial pain and pressure.  Denies chest pain, abdominal pain, nausea vomiting or diarrhea.  History of seasonal allergies, asthma on antihistamines, Advair, albuterol, Singulair.  Has been having to use this inhaler frequently and feeling like it is becoming less effective.  Has also been taking over-the-counter cold and congestion medication.    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HCC)    Rupture of right triceps tendon 09/01/2015    Patient Active Problem List   Diagnosis Date Noted   Acute respiratory failure with hypoxia (HCC) 11/01/2021   Atrial fibrillation with RVR (HCC) 10/31/2021   Community acquired pneumonia 10/31/2021   Elevated d-dimer 10/31/2021   Hyperlipidemia 10/31/2021   Severe sepsis (HCC) 10/29/2021   Pill dysphagia 04/05/2021   History of colonic polyps 01/31/2021   Bloating 07/25/2020   GERD (gastroesophageal reflux disease) 07/25/2020   Constipation 04/20/2020   Family history of colon cancer 04/20/2020   Rupture of right triceps tendon 09/01/2015   Anxiety 03/20/2015   Asthma, chronic    Vertigo 03/19/2015   Essential hypertension 03/19/2015   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    COLONOSCOPY WITH PROPOFOL N/A 06/26/2021   fair prep, non-bleeding internal hemorrhoids, sigmoid diverticulosis, two 4-7 mm polyps in cecum, one 2 mmp olyp in cecum. 3 year surveillance. Tubular adenoma.   ESOPHAGOGASTRODUODENOSCOPY (EGD) WITH PROPOFOL N/A 06/26/2021   moderate Shatzki ring, small hiatal hernia, gastritis s/p biopsy, normal duodenum. Negative H.pylori.   KNEE ARTHROPLASTY Left 10/2020   KNEE ARTHROSCOPY Right 1980s   NASAL SINUS SURGERY     POLYPECTOMY  07/14/2020   Procedure: POLYPECTOMY;  Surgeon: Lanelle Bal, DO;  [facility]: [facility];  Service: Endoscopy;;   POLYPECTOMY  06/26/2021   Procedure: POLYPECTOMY;  Surgeon: Lanelle Bal, DO;  [facility]: [facility];  Service: Endoscopy;;   PROSTATE SURGERY     TONSILLECTOMY  age 43   TRICEPS TENDON REPAIR Right 09/01/2015   Procedure: RIGHT TRICEPS  REPAIR;  Surgeon: Teryl Lucy, MD;  Location: [facility];  Service: Orthopedics;  Laterality: Right;       Home Medications    Prior to Admission medications   Medication Sig Start Date End Date Taking? Authorizing Provider  azithromycin (ZITHROMAX) 250 MG tablet Take first 2 tablets together, then 1 every day until finished. 09/09/23  Yes Particia Nearing, PA-C  promethazine-dextromethorphan (PROMETHAZINE-DM) 6.25-15 MG/5ML syrup Take 5 mLs by mouth 4 (four) times daily as needed.  09/09/23  Yes Particia Nearing, PA-C  albuterol (VENTOLIN HFA) 108 (90 Base) MCG/ACT inhaler Inhale 2 puffs into the lungs every 6 (six) hours as needed for wheezing or shortness of breath. 03/16/08   [provider]  ALPRAZolam Prudy Feeler) 0.5 MG tablet Take 0.5 mg by mouth at bedtime. 10/22/21   [provider]  benzonatate (TESSALON) 100 MG  capsule Take 1-2 capsules (100-200 mg total) by mouth 3 (three) times daily as needed for cough. Do not take with alcohol or while driving or operating heavy machinery.  May cause drowsiness. 12/09/22   Valentino Nose, NP  Calcium Carb-Cholecalciferol (CALCIUM 600 + D PO) Take 1 tablet by mouth daily.    [provider]  cetirizine (ZYRTEC) 10 MG tablet Take 10 mg by mouth daily. 10/22/21   [provider]  diclofenac Sodium (VOLTAREN) 1 % GEL Apply 4 g topically 4 (four) times daily. 10/22/21   [provider]  DULoxetine (CYMBALTA) 20 MG capsule Take 20 mg by mouth at bedtime.    [provider]  ezetimibe (ZETIA) 10 MG tablet Take 10 mg by mouth daily.    [provider]  fluticasone (FLONASE) 50 MCG/ACT nasal spray Place 2 sprays into both nostrils daily. 12/01/22   Leath-Warren, Sadie Haber, NP  Fluticasone-Salmeterol (ADVAIR) 250-50 MCG/DOSE AEPB Inhale 1 puff into the lungs every 12 (twelve) hours.    [provider]  guaifenesin (HUMIBID E) 400 MG TABS tablet Take 400 mg by mouth daily as needed (congestion).    [provider]  hydroxypropyl methylcellulose / hypromellose (ISOPTO TEARS / GONIOVISC) 2.5 % ophthalmic solution Place 1 drop into both eyes as needed for dry eyes.     [provider]  ipratropium-albuterol (DUONEB) 0.5-2.5 (3) MG/3ML SOLN Take 3 mLs by nebulization every 6 (six) hours as needed. 11/03/21   Narda Bonds, MD  losartan (COZAAR) 100 MG tablet Take 100 mg by mouth daily.    [provider]  metoprolol tartrate (LOPRESSOR) 25 MG tablet Take 1 tablet (25 mg total) by mouth 2 (two) times daily. 05/01/22 04/26/23  Antoine Poche, MD  montelukast (SINGULAIR) 10 MG tablet Take 10 mg by mouth daily. 03/18/15   [provider]  Multiple Vitamin (MULTIVITAMIN WITH MINERALS) TABS tablet Take 1 tablet by mouth daily.    [provider]  niacin 500 MG tablet Take 500 mg by mouth  daily.     [provider]  Omega-3 Fatty Acids (FISH OIL) 1000 MG CAPS Take 1,000 mg by mouth daily.     [provider]  omeprazole (PRILOSEC) 20 MG capsule Take 1 capsule (20 mg total) by mouth 2 (two) times daily before a meal. Patient taking differently: Take 20 mg by mouth daily. 06/27/21 05/20/22  Tiffany Kocher, PA-C  promethazine-dextromethorphan (PROMETHAZINE-DM) 6.25-15 MG/5ML syrup Take 5 mLs by mouth at bedtime as needed for cough. 05/18/23   Leath-Warren, Sadie Haber, NP    Family History Family History  Problem Relation Age of Onset   Colon cancer Father 46   Allergic rhinitis Brother    Allergic rhinitis Brother    Asthma Maternal Uncle    Angioedema Neg Hx    Eczema Neg Hx    Urticaria Neg Hx    Immunodeficiency Neg Hx     Social History Social History   Tobacco Use   Smoking status: Former    Current packs/day: 0.00    Average packs/day: 3.0 packs/day for 20.0 years (  60.0 ttl pk-yrs)    Types: Cigarettes    Start date: 08/30/1963    Quit date: 08/30/1983    Years since quitting: 40.0   Smokeless tobacco: Former    Types: Chew    Quit date: 08/29/1985  Vaping Use   Vaping status: Never Used  Substance Use Topics   Alcohol use: Yes    Comment: 3 to 5 beers weekly   Drug use: No     Allergies   Penicillins, Shellfish allergy, Atorvastatin, Simvastatin, and Codeine   Review of Systems Review of Systems Per HPI  Physical Exam Triage Vital Signs ED Triage Vitals [09/09/23 1125]  Encounter Vitals Group     BP 128/67     Systolic BP Percentile      Diastolic BP Percentile      Pulse Rate 76     Resp      Temp (!) 96 F (35.6 C)     Temp Source Oral     SpO2 95 %     Weight      Height      Head Circumference      Peak Flow      Pain Score      Pain Loc      Pain Education      Exclude from Growth Chart    No data found.  Updated Vital Signs BP 128/67 (BP Location: Right Arm)   Pulse 76   Temp (!) 96 F (35.6 C) (Oral)    SpO2 95%   Visual Acuity Right Eye Distance:   Left Eye Distance:   Bilateral Distance:    Right Eye Near:   Left Eye Near:    Bilateral Near:     Physical Exam Vitals and nursing note reviewed.  Constitutional:      Appearance: He is well-developed.  HENT:     Head: Atraumatic.     Right Ear: External ear normal.     Left Ear: External ear normal.     Nose: Congestion present.     Mouth/Throat:     Pharynx: Posterior oropharyngeal erythema present. No oropharyngeal exudate.  Eyes:     Conjunctiva/sclera: Conjunctivae normal.     Pupils: Pupils are equal, round, and reactive to light.  Cardiovascular:     Rate and Rhythm: Normal rate and regular rhythm.  Pulmonary:     Effort: Pulmonary effort is normal. No respiratory distress.     Breath sounds: Wheezing present. No rales.  Musculoskeletal:        General: Normal range of motion.     Cervical back: Normal range of motion and neck supple.  Lymphadenopathy:     Cervical: No cervical adenopathy.  Skin:    General: Skin is warm and dry.  Neurological:     Mental Status: He is alert and oriented to person, place, and time.  Psychiatric:        Behavior: Behavior normal.      UC Treatments / Results  Labs (all labs ordered are listed, but only abnormal results are displayed) Labs Reviewed - No data to display  EKG   Radiology No results found.  Procedures Procedures (including critical care time)  Medications Ordered in UC Medications  dexamethasone (DECADRON) injection 10 mg (10 mg Intramuscular Given 09/09/23 1144)    Initial Impression / Assessment and Plan / UC Course  I have reviewed the triage vital signs and the nursing notes.  Pertinent labs & imaging results that were available during  my care of the patient were reviewed by me and considered in my medical decision making (see chart for details).     Given duration and worsening course, treat with Zithromax, Phenergan DM, IM Decadron,  continued albuterol and Advair regimen and allergy regimen.  Supportive over-the-counter medications and home care reviewed.  Return for worsening symptoms.  Final Clinical Impressions(s) / UC Diagnoses   Final diagnoses:  Acute non-recurrent maxillary sinusitis  Moderate persistent asthma with acute exacerbation   Discharge Instructions   None    ED Prescriptions     Medication Sig Dispense Auth. Provider   azithromycin (ZITHROMAX) 250 MG tablet Take first 2 tablets together, then 1 every day until finished. 6 tablet Particia Nearing, [place]   promethazine-dextromethorphan (PROMETHAZINE-DM) 6.25-15 MG/5ML syrup Take 5 mLs by mouth 4 (four) times daily as needed. 100 mL Particia Nearing, [place]      PDMP not reviewed this encounter.   Particia Nearing, [place] 09/09/23 [phone]

## 2023-09-09 NOTE — ED Triage Notes (Signed)
Pt c/o of cough  and nasal congestion x2 weeks. Taking otc cough medication.

## 2023-12-23 ENCOUNTER — Ambulatory Visit
Admission: RE | Admit: 2023-12-23 | Discharge: 2023-12-23 | Disposition: A | Discharge status: Home / Self Care | Payer: Medicare Other | Source: Ambulatory Visit | Attending: Nurse Practitioner | Admitting: Nurse Practitioner

## 2023-12-23 VITALS — BP 134/74 | HR 69 | Temp 98.7°F | Resp 20

## 2023-12-23 DIAGNOSIS — J019 Acute sinusitis, unspecified: Secondary | ICD-10-CM | POA: Diagnosis not present

## 2023-12-23 DIAGNOSIS — B9689 Other specified bacterial agents as the cause of diseases classified elsewhere: Secondary | ICD-10-CM

## 2023-12-23 MED ORDER — BENZONATATE 100 MG PO CAPS: 100.0000 mg | ORAL_CAPSULE | Freq: Three times a day (TID) | ORAL | 0 refills | Status: AC | PRN

## 2023-12-23 MED ORDER — DOXYCYCLINE HYCLATE 100 MG PO CAPS: 100.0000 mg | ORAL_CAPSULE | Freq: Two times a day (BID) | ORAL | 0 refills | Status: AC

## 2023-12-23 NOTE — ED Triage Notes (Signed)
Pt reports dry  cough, nasal congestion, headache sinus drainage, yellowish colored mucus x 2 weeks

## 2023-12-23 NOTE — ED Provider Notes (Signed)
[facility]    CSN: [id number] Arrival date & time: 12/23/23  0915      History   Chief Complaint No chief complaint on file.   HPI Roberto Sanchez. is a 78 y.o. male.   Patient presents today with 2-week history of chills, dry cough, shortness of breath when he lays down at night, nasal congestion with thick, discolored mucus, runny nose, headache, decreased appetite, and fatigue.  No fever, body aches, congested cough, wheezing, breathing trouble, chest tightness, sore throat, ear pain, abdominal pain, nausea/vomiting, or diarrhea.  Reports his son and his brother were sick with similar symptoms before his began.  Has taken Tylenol for the headache with mild improvement.  Patient reports history of asthma, reports his asthma has not been flaring up since symptoms began.    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HCC)    Rupture of right triceps tendon 09/01/2015    Patient Active Problem List   Diagnosis Date Noted   Acute respiratory failure with hypoxia (HCC) 11/01/2021   Atrial fibrillation with RVR (HCC) 10/31/2021   Community acquired pneumonia 10/31/2021   Elevated d-dimer 10/31/2021   Hyperlipidemia 10/31/2021   Severe sepsis (HCC) 10/29/2021   Pill dysphagia 04/05/2021   History of colonic polyps 01/31/2021   Bloating 07/25/2020   GERD (gastroesophageal reflux disease) 07/25/2020   Constipation 04/20/2020   Family history of colon cancer 04/20/2020   Rupture of right triceps tendon 09/01/2015   Anxiety 03/20/2015   Asthma, chronic    Vertigo 03/19/2015   Essential hypertension 03/19/2015   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    COLONOSCOPY WITH PROPOFOL N/A 06/26/2021   fair prep, non-bleeding internal hemorrhoids, sigmoid diverticulosis, two 4-7 mm polyps in cecum, one 2 mmp olyp in cecum. 3 year surveillance. Tubular adenoma.   ESOPHAGOGASTRODUODENOSCOPY (EGD) WITH PROPOFOL N/A 06/26/2021   moderate Shatzki ring, small hiatal hernia, gastritis s/p biopsy, normal duodenum. Negative H.pylori.   KNEE ARTHROPLASTY Left 10/2020   KNEE ARTHROSCOPY Right 1980s   NASAL SINUS SURGERY     POLYPECTOMY  07/14/2020   Procedure: POLYPECTOMY;  Surgeon: Lanelle Bal, DO;  Location: [facility];  Service: Endoscopy;;   POLYPECTOMY  06/26/2021   Procedure: POLYPECTOMY;  Surgeon: Lanelle Bal, DO;  Location: [facility];  Service: Endoscopy;;   PROSTATE SURGERY     TONSILLECTOMY  age 11   TRICEPS TENDON REPAIR Right 09/01/2015   Procedure: RIGHT TRICEPS  REPAIR;  Surgeon: Teryl Lucy, MD;  Location: [facility];  Service: Orthopedics;  Laterality: Right;       Home Medications    Prior to Admission medications   Medication Sig Start Date End Date Taking? Authorizing Provider  doxycycline (VIBRAMYCIN) 100 MG capsule Take 1 capsule (100 mg total) by  mouth 2 (two) times daily for 7 days. 12/23/23 12/30/23 Yes Valentino Nose, NP  albuterol (VENTOLIN HFA) 108 (90 Base) MCG/ACT inhaler Inhale 2 puffs into the lungs every 6 (six) hours as needed for wheezing or shortness of breath. 03/16/08   [provider]  ALPRAZolam Prudy Feeler) 0.5 MG tablet Take 0.5 mg by mouth at bedtime. 10/22/21   [provider]  azithromycin (ZITHROMAX) 250 MG tablet Take first  2 tablets together, then 1 every day until finished. 09/09/23   Particia Nearing, PA-C  benzonatate (TESSALON) 100 MG capsule Take 1-2 capsules (100-200 mg total) by mouth 3 (three) times daily as needed for cough. Do not take with alcohol or while driving or operating heavy machinery.  May cause drowsiness. 12/23/23   Valentino Nose, NP  Calcium Carb-Cholecalciferol (CALCIUM 600 + D PO) Take 1 tablet by mouth daily.    [provider]  cetirizine (ZYRTEC) 10 MG tablet Take 10 mg by mouth daily. 10/22/21   [provider]  diclofenac Sodium (VOLTAREN) 1 % GEL Apply 4 g topically 4 (four) times daily. 10/22/21   [provider]  DULoxetine (CYMBALTA) 20 MG capsule Take 20 mg by mouth at bedtime.    [provider]  ezetimibe (ZETIA) 10 MG tablet Take 10 mg by mouth daily.    [provider]  fluticasone (FLONASE) 50 MCG/ACT nasal spray Place 2 sprays into both nostrils daily. 12/01/22   Leath-Warren, Sadie Haber, NP  Fluticasone-Salmeterol (ADVAIR) 250-50 MCG/DOSE AEPB Inhale 1 puff into the lungs every 12 (twelve) hours.    [provider]  guaifenesin (HUMIBID E) 400 MG TABS tablet Take 400 mg by mouth daily as needed (congestion).    [provider]  hydroxypropyl methylcellulose / hypromellose (ISOPTO TEARS / GONIOVISC) 2.5 % ophthalmic solution Place 1 drop into both eyes as needed for dry eyes.     [provider]  ipratropium-albuterol (DUONEB) 0.5-2.5 (3) MG/3ML SOLN Take 3 mLs by nebulization every 6 (six) hours as needed. 11/03/21   Narda Bonds, MD  losartan (COZAAR) 100 MG tablet Take 100 mg by mouth daily.    [provider]  metoprolol tartrate (LOPRESSOR) 25 MG tablet Take 1 tablet (25 mg total) by mouth 2 (two) times daily. 05/01/22 04/26/23  Antoine Poche, MD  montelukast (SINGULAIR) 10 MG tablet Take 10 mg by mouth daily. 03/18/15   [provider]  Multiple Vitamin (MULTIVITAMIN WITH  MINERALS) TABS tablet Take 1 tablet by mouth daily.    [provider]  niacin 500 MG tablet Take 500 mg by mouth daily.     [provider]  Omega-3 Fatty Acids (FISH OIL) 1000 MG CAPS Take 1,000 mg by mouth daily.     [provider]  omeprazole (PRILOSEC) 20 MG capsule Take 1 capsule (20 mg total) by mouth 2 (two) times daily before a meal. Patient taking differently: Take 20 mg by mouth daily. 06/27/21 05/20/22  Tiffany Kocher, PA-C  promethazine-dextromethorphan (PROMETHAZINE-DM) 6.25-15 MG/5ML syrup Take 5 mLs by mouth at bedtime as needed for cough. 05/18/23   Leath-Warren, Sadie Haber, NP  promethazine-dextromethorphan (PROMETHAZINE-DM) 6.25-15 MG/5ML syrup Take 5 mLs by mouth 4 (four) times daily as needed. 09/09/23   Particia Nearing, PA-C    Family History Family History  Problem Relation Age of Onset   Colon cancer Father 40   Allergic rhinitis Brother    Allergic rhinitis Brother    Asthma Maternal Uncle  Angioedema Neg Hx    Eczema Neg Hx    Urticaria Neg Hx    Immunodeficiency Neg Hx     Social History Social History   Tobacco Use   Smoking status: Former    Current packs/day: 0.00    Average packs/day: 3.0 packs/day for 20.0 years (60.0 ttl pk-yrs)    Types: Cigarettes    Start date: 08/30/1963    Quit date: 08/30/1983    Years since quitting: 40.3   Smokeless tobacco: Former    Types: Chew    Quit date: 08/29/1985  Vaping Use   Vaping status: Never Used  Substance Use Topics   Alcohol use: Yes    Comment: 3 to 5 beers weekly   Drug use: No     Allergies   Penicillins, Shellfish allergy, Atorvastatin, Simvastatin, and Codeine   Review of Systems Review of Systems Per HPI  Physical Exam Triage Vital Signs ED Triage Vitals [12/23/23 0931]  Encounter Vitals Group     BP 134/74     Systolic BP Percentile      Diastolic BP Percentile      Pulse Rate 69     Resp 20     Temp 98.7 F (37.1 C)     Temp Source Oral      SpO2 95 %     Weight      Height      Head Circumference      Peak Flow      Pain Score      Pain Loc      Pain Education      Exclude from Growth Chart    No data found.  Updated Vital Signs BP 134/74 (BP Location: Right Arm)   Pulse 69   Temp 98.7 F (37.1 C) (Oral)   Resp 20   SpO2 95%   Visual Acuity Right Eye Distance:   Left Eye Distance:   Bilateral Distance:    Right Eye Near:   Left Eye Near:    Bilateral Near:     Physical Exam Vitals and nursing note reviewed.  Constitutional:      General: He is not in acute distress.    Appearance: Normal appearance. He is not ill-appearing or toxic-appearing.  HENT:     Head: Normocephalic and atraumatic.     Right Ear: Tympanic membrane, ear canal and external ear normal.     Left Ear: Tympanic membrane, ear canal and external ear normal.     Nose: Congestion present. No rhinorrhea.     Right Sinus: Frontal sinus tenderness present.     Left Sinus: Frontal sinus tenderness present.     Mouth/Throat:     Mouth: Mucous membranes are moist.     Pharynx: Oropharynx is clear. No oropharyngeal exudate or posterior oropharyngeal erythema.  Eyes:     General: No scleral icterus.    Extraocular Movements: Extraocular movements intact.  Cardiovascular:     Rate and Rhythm: Normal rate and regular rhythm.  Pulmonary:     Effort: Pulmonary effort is normal. No respiratory distress.     Breath sounds: Normal breath sounds. No wheezing, rhonchi or rales.  Musculoskeletal:     Cervical back: Normal range of motion and neck supple.  Lymphadenopathy:     Cervical: No cervical adenopathy.  Skin:    General: Skin is warm and dry.     Coloration: Skin is not jaundiced or pale.     Findings: No erythema or rash.  Neurological:     Mental Status: He is alert and oriented to person, place, and time.  Psychiatric:        Behavior: Behavior is cooperative.      UC Treatments / Results  Labs (all labs ordered are listed, but  only abnormal results are displayed) Labs Reviewed - No data to display  EKG   Radiology No results found.  Procedures Procedures (including critical care time)  Medications Ordered in UC Medications - No data to display  Initial Impression / Assessment and Plan / UC Course  I have reviewed the triage vital signs and the nursing notes.  Pertinent labs & imaging results that were available during my care of the patient were reviewed by me and considered in my medical decision making (see chart for details).   Patient is well-appearing, normotensive, afebrile, not tachycardic, not tachypneic, oxygenating well on room air.    1. Acute bacterial sinusitis Vitals and exam are reassuring today Treat with doxycycline twice daily for 7 days, also start Mucinex and cough suppressant medication ER and return precautions discussed with patient  The patient was given the opportunity to ask questions.  All questions answered to their satisfaction.  The patient is in agreement to this plan.    Final Clinical Impressions(s) / UC Diagnoses   Final diagnoses:  Acute bacterial sinusitis     Discharge Instructions      Take the doxycycline as prescribed to treat the sinus infection.  Continue Tylenol and start Mucinex 600 mg every 12 hours as needed.  Make sure you are drinking plenty of fluids.  You can take the Tessalon perles every 8 hours as needed for cough.     ED Prescriptions     Medication Sig Dispense Auth. Provider   benzonatate (TESSALON) 100 MG capsule Take 1-2 capsules (100-200 mg total) by mouth 3 (three) times daily as needed for cough. Do not take with alcohol or while driving or operating heavy machinery.  May cause drowsiness. 30 capsule Cathlean Marseilles A, NP   doxycycline (VIBRAMYCIN) 100 MG capsule Take 1 capsule (100 mg total) by mouth 2 (two) times daily for 7 days. 14 capsule Valentino Nose, NP      PDMP not reviewed this encounter.   Valentino Nose, NP 12/23/23 1146

## 2023-12-23 NOTE — Discharge Instructions (Signed)
Take the doxycycline as prescribed to treat the sinus infection.  Continue Tylenol and start Mucinex 600 mg every 12 hours as needed.  Make sure you are drinking plenty of fluids.  You can take the Tessalon perles every 8 hours as needed for cough.

## 2024-03-13 ENCOUNTER — Ambulatory Visit
Admission: EM | Admit: 2024-03-13 | Discharge: 2024-03-13 | Disposition: A | Discharge status: Home / Self Care | Attending: Family Medicine | Admitting: Family Medicine

## 2024-03-13 DIAGNOSIS — K047 Periapical abscess without sinus: Secondary | ICD-10-CM

## 2024-03-13 DIAGNOSIS — H9202 Otalgia, left ear: Secondary | ICD-10-CM | POA: Diagnosis not present

## 2024-03-13 DIAGNOSIS — H6122 Impacted cerumen, left ear: Secondary | ICD-10-CM

## 2024-03-13 MED ORDER — CLINDAMYCIN HCL 300 MG PO CAPS
300.0000 mg | ORAL_CAPSULE | Freq: Two times a day (BID) | ORAL | 0 refills | Status: DC
Start: 2024-03-13 — End: 2024-03-23

## 2024-03-13 MED ORDER — LIDOCAINE VISCOUS HCL 2 % MT SOLN: 10.0000 mL | OROMUCOSAL | 0 refills | Status: DC | PRN

## 2024-03-13 MED ORDER — CARBAMIDE PEROXIDE 6.5 % OT SOLN: 5.0000 [drp] | Freq: Two times a day (BID) | OTIC | 0 refills | Status: DC

## 2024-03-13 NOTE — ED Provider Notes (Signed)
 [facility]    CSN: [id number] Arrival date & time: 03/13/24  8413      History   Chief Complaint No chief complaint on file.   HPI Roberto Sanchez. is a 78 y.o. male.   Patient presenting today with left dental pain and left ear pain x 1 day.  Woke up this morning with facial swelling in this area.  Denies fever, chills, difficulty breathing or swallowing, bleeding or drainage.  So far not trying anything over-the-counter for symptoms.    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HCC)    Rupture of right triceps tendon 09/01/2015    Patient Active Problem List   Diagnosis Date Noted   Acute respiratory failure with hypoxia (HCC) 11/01/2021   Atrial fibrillation with RVR (HCC) 10/31/2021   Community acquired pneumonia 10/31/2021   Elevated d-dimer 10/31/2021   Hyperlipidemia 10/31/2021   Severe sepsis (HCC) 10/29/2021   Pill dysphagia 04/05/2021   History of colonic polyps 01/31/2021   Bloating 07/25/2020   GERD (gastroesophageal reflux disease) 07/25/2020   Constipation 04/20/2020   Family history of colon cancer 04/20/2020   Rupture of right triceps tendon 09/01/2015   Anxiety 03/20/2015   Asthma, chronic    Vertigo 03/19/2015   Essential hypertension 03/19/2015   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     COLONOSCOPY WITH PROPOFOL  N/A 06/26/2021   fair prep, non-bleeding internal hemorrhoids, sigmoid diverticulosis, two 4-7 mm polyps in cecum, one 2 mmp olyp in cecum. 3 year surveillance. Tubular adenoma.   ESOPHAGOGASTRODUODENOSCOPY (EGD) WITH PROPOFOL  N/A 06/26/2021   moderate Shatzki ring, small hiatal hernia, gastritis s/p biopsy, normal duodenum. Negative H.pylori.   KNEE ARTHROPLASTY Left 10/2020   KNEE ARTHROSCOPY Right 1980s   NASAL SINUS SURGERY     POLYPECTOMY  07/14/2020   Procedure: POLYPECTOMY;  Surgeon: Vinetta Greening, DO;  Location: [facility];  Service: Endoscopy;;   POLYPECTOMY  06/26/2021   Procedure: POLYPECTOMY;  Surgeon: Vinetta Greening, DO;  [facility]: [facility];  Service: Endoscopy;;   PROSTATE SURGERY     TONSILLECTOMY  age 36   TRICEPS TENDON REPAIR Right 09/01/2015   Procedure: RIGHT TRICEPS  REPAIR;  Surgeon: Osa Blase, MD;  Location: [facility];  Service: Orthopedics;  Laterality: Right;       Home Medications    Prior to Admission medications   Medication Sig Start Date End Date Taking? Authorizing Provider  carbamide peroxide (DEBROX) 6.5 % OTIC solution Place 5 drops into the left ear 2 (two) times daily. 03/13/24  Yes Corbin Dess, PA-C  clindamycin  (CLEOCIN ) 300 MG capsule Take 1 capsule (300 mg total) by mouth 2 (two) times daily. 03/13/24  Yes Corbin Dess, PA-C  lidocaine  (XYLOCAINE ) 2 % solution Use as directed 10 mLs in  the mouth or throat every 3 (three) hours as needed. 03/13/24  Yes Corbin Dess, PA-C  albuterol  (VENTOLIN  HFA) 108 [phone] Base) MCG/ACT inhaler Inhale 2 puffs into the lungs every 6 (six) hours as needed for wheezing or shortness of breath. 03/16/08   [provider]  ALPRAZolam  (XANAX ) 0.5 MG tablet Take 0.5 mg by mouth at bedtime. 10/22/21   [provider]  azithromycin  (ZITHROMAX ) 250 MG tablet Take first 2 tablets together, then 1 every day until finished.  09/09/23   Corbin Dess, PA-C  benzonatate  (TESSALON ) 100 MG capsule Take 1-2 capsules (100-200 mg total) by mouth 3 (three) times daily as needed for cough. Do not take with alcohol  or while driving or operating heavy machinery.  May cause drowsiness. 12/23/23   Wilhemena Harbour, NP  Calcium Carb-Cholecalciferol (CALCIUM 600 + D PO) Take 1 tablet by mouth daily.    [provider]  cetirizine (ZYRTEC) 10 MG tablet Take 10 mg by mouth daily. 10/22/21   [provider]  diclofenac Sodium (VOLTAREN) 1 % GEL Apply 4 g topically 4 (four) times daily. 10/22/21   [provider]  DULoxetine  (CYMBALTA ) 20 MG capsule Take 20 mg by mouth at bedtime.    [provider]  ezetimibe  (ZETIA ) 10 MG tablet Take 10 mg by mouth daily.    [provider]  fluticasone  (FLONASE ) 50 MCG/ACT nasal spray Place 2 sprays into both nostrils daily. 12/01/22   Leath-Warren, Belen Bowers, NP  Fluticasone -Salmeterol (ADVAIR) 250-50 MCG/DOSE AEPB Inhale 1 puff into the lungs every 12 (twelve) hours.    [provider]  guaifenesin  (HUMIBID E) 400 MG TABS tablet Take 400 mg by mouth daily as needed (congestion).    [provider]  hydroxypropyl methylcellulose / hypromellose (ISOPTO TEARS / GONIOVISC) 2.5 % ophthalmic solution Place 1 drop into both eyes as needed for dry eyes.     [provider]  ipratropium-albuterol  (DUONEB) 0.5-2.5 (3) MG/3ML SOLN Take 3 mLs by nebulization every 6 (six) hours as needed. 11/03/21   Verlyn Goad, MD  losartan  (COZAAR ) 100 MG tablet Take 100 mg by mouth daily.    [provider]  metoprolol  tartrate (LOPRESSOR ) 25 MG tablet Take 1 tablet (25 mg total) by mouth 2 (two) times daily. 05/01/22 04/26/23  Laurann Pollock, MD  montelukast  (SINGULAIR ) 10 MG tablet Take 10 mg by mouth daily. 03/18/15   [provider]  Multiple Vitamin (MULTIVITAMIN WITH MINERALS) TABS tablet Take 1 tablet by mouth daily.     [provider]  niacin 500 MG tablet Take 500 mg by mouth daily.     [provider]  Omega-3 Fatty Acids (FISH OIL) 1000 MG CAPS Take 1,000 mg by mouth daily.     [provider]  omeprazole  (PRILOSEC) 20 MG capsule Take 1 capsule (20 mg total) by mouth 2 (two) times daily before a meal. Patient taking differently: Take 20 mg by mouth daily. 06/27/21 05/20/22  Lanney Pitts, PA-C  promethazine -dextromethorphan (PROMETHAZINE -DM) 6.25-15 MG/5ML syrup Take 5 mLs by mouth at bedtime as needed for cough. 05/18/23   Leath-Warren, Belen Bowers, NP  promethazine -dextromethorphan (PROMETHAZINE -DM) 6.25-15 MG/5ML syrup Take 5 mLs by mouth 4 (four) times daily as needed. 09/09/23   Corbin Dess, PA-C    Family History Family History  Problem Relation Age of Onset   Colon cancer Father 44   Allergic rhinitis Brother    Allergic rhinitis Brother    Asthma Maternal  Uncle    Angioedema Neg Hx    Eczema Neg Hx    Urticaria Neg Hx    Immunodeficiency Neg Hx     Social History Social History   Tobacco Use   Smoking status: Former    Current packs/day: 0.00    Average packs/day: 3.0 packs/day for 20.0 years (60.0 ttl pk-yrs)    Types: Cigarettes    Start date: 08/30/1963    Quit date: 08/30/1983    Years since quitting: 40.5   Smokeless tobacco: Former    Types: Chew    Quit date: 08/29/1985  Vaping Use   Vaping status: Never Used  Substance Use Topics   Alcohol  use: Yes    Comment: 3 to 5 beers weekly   Drug use: No     Allergies   Penicillins, Shellfish allergy, Atorvastatin, Simvastatin, and Codeine   Review of Systems Review of Systems Per HPI  Physical Exam Triage Vital Signs ED Triage Vitals  Encounter Vitals Group     BP 03/13/24 0910 139/76     Systolic BP Percentile --      Diastolic BP Percentile --      Pulse Rate 03/13/24 0910 74     Resp 03/13/24 0910 20     Temp 03/13/24 0910 98.1 F (36.7 C)     Temp Source 03/13/24 0910 Oral      SpO2 03/13/24 0910 94 %     Weight --      Height --      Head Circumference --      Peak Flow --      Pain Score 03/13/24 0909 8     Pain Loc --      Pain Education --      Exclude from Growth Chart --    No data found.  Updated Vital Signs BP 139/76 (BP Location: Right Arm)   Pulse 74   Temp 98.1 F (36.7 C) (Oral)   Resp 20   SpO2 94%   Visual Acuity Right Eye Distance:   Left Eye Distance:   Bilateral Distance:    Right Eye Near:   Left Eye Near:    Bilateral Near:     Physical Exam Vitals and nursing note reviewed.  Constitutional:      Appearance: Normal appearance.  HENT:     Head: Atraumatic.     Left Ear: There is impacted cerumen.     Nose: Nose normal.     Mouth/Throat:     Mouth: Mucous membranes are moist.     Pharynx: Oropharynx is clear.     Comments: Gingival erythema and edema to the left lower jaw in area of pain Eyes:     Extraocular Movements: Extraocular movements intact.     Conjunctiva/sclera: Conjunctivae normal.  Cardiovascular:     Rate and Rhythm: Normal rate.  Pulmonary:     Effort: Pulmonary effort is normal.  Musculoskeletal:        General: Normal range of motion.     Cervical back: Normal range of motion and neck supple.  Skin:    General: Skin is warm and dry.  Neurological:     General: No focal deficit present.     Mental Status: He is oriented to person, place, and time.     Motor: No weakness.     Gait: Gait normal.  Psychiatric:        Mood and Affect: Mood normal.        Thought Content:  Thought content normal.        Judgment: Judgment normal.      UC Treatments / Results  Labs (all labs ordered are listed, but only abnormal results are displayed) Labs Reviewed - No data to display  EKG   Radiology No results found.  Procedures Procedures (including critical care time)  Medications Ordered in UC Medications - No data to display  Initial Impression / Assessment and Plan / UC Course  I have  reviewed the triage vital signs and the nursing notes.  Pertinent labs & imaging results that were available during my care of the patient were reviewed by me and considered in my medical decision making (see chart for details).     Proximal drops and home warm water  lavage for cerumen impaction of left ear.  Unable to visualize TM today so unclear if true ear infection or if the impaction is causing his ear pain.  Will cover for gingival infection in the area of his pain with clindamycin , viscous lidocaine .  Supportive over-the-counter medications, home care and dental follow-up recommended.  Final Clinical Impressions(s) / UC Diagnoses   Final diagnoses:  Dental infection  Left ear pain  Impacted cerumen of left ear     Discharge Instructions      I have sent over some Debrox wax softening drops for the left ear as you have a large wax plug.  You may also do warm water  rinses to remove this in addition.  I have sent over an antibiotic that would cover for either an ear infection or dental infection and some numbing liquid that you may soak a cotton ball and set on the gums where the pain is.  You may also take Tylenol  and do cool compresses.  Follow-up with primary care next week.    ED Prescriptions     Medication Sig Dispense Auth. Provider   clindamycin  (CLEOCIN ) 300 MG capsule Take 1 capsule (300 mg total) by mouth 2 (two) times daily. 14 capsule Corbin Dess, PA-C   lidocaine  (XYLOCAINE ) 2 % solution Use as directed 10 mLs in the mouth or throat every 3 (three) hours as needed. 100 mL Corbin Dess, PA-C   carbamide peroxide (DEBROX) 6.5 % OTIC solution Place 5 drops into the left ear 2 (two) times daily. 15 mL Corbin Dess, [place]      PDMP not reviewed this encounter.   Corbin Dess, [place] 03/13/24 1023

## 2024-03-13 NOTE — ED Triage Notes (Signed)
 Pt reports he has a left side ear ache and tooth pain x 1 day

## 2024-03-13 NOTE — Discharge Instructions (Signed)
 I have sent over some Debrox wax softening drops for the left ear as you have a large wax plug.  You may also do warm water  rinses to remove this in addition.  I have sent over an antibiotic that would cover for either an ear infection or dental infection and some numbing liquid that you may soak a cotton ball and set on the gums where the pain is.  You may also take Tylenol  and do cool compresses.  Follow-up with primary care next week.

## 2024-03-23 ENCOUNTER — Encounter: Payer: Self-pay | Admitting: *Deleted

## 2024-03-23 ENCOUNTER — Other Ambulatory Visit: Payer: Self-pay | Admitting: *Deleted

## 2024-03-23 ENCOUNTER — Encounter: Payer: Self-pay | Admitting: Gastroenterology

## 2024-03-23 ENCOUNTER — Ambulatory Visit (INDEPENDENT_AMBULATORY_CARE_PROVIDER_SITE_OTHER): Admitting: Gastroenterology

## 2024-03-23 VITALS — BP 102/60 | HR 73 | Temp 98.5°F | Ht 70.0 in | Wt 213.0 lb

## 2024-03-23 DIAGNOSIS — K219 Gastro-esophageal reflux disease without esophagitis: Secondary | ICD-10-CM | POA: Diagnosis not present

## 2024-03-23 DIAGNOSIS — K5900 Constipation, unspecified: Secondary | ICD-10-CM

## 2024-03-23 DIAGNOSIS — Z800 Family history of malignant neoplasm of digestive organs: Secondary | ICD-10-CM | POA: Diagnosis not present

## 2024-03-23 DIAGNOSIS — K5909 Other constipation: Secondary | ICD-10-CM | POA: Diagnosis not present

## 2024-03-23 DIAGNOSIS — Z860101 Personal history of adenomatous and serrated colon polyps: Secondary | ICD-10-CM | POA: Diagnosis not present

## 2024-03-23 DIAGNOSIS — Z860100 Personal history of colon polyps, unspecified: Secondary | ICD-10-CM

## 2024-03-23 MED ORDER — PEG 3350-KCL-NA BICARB-NACL 420 G PO SOLR: 4000.0000 mL | Freq: Once | ORAL | 0 refills | Status: AC

## 2024-03-23 NOTE — Progress Notes (Signed)
 Gastroenterology Office Note    Referring Provider: [facility] Primary Care Physician:  [facility]     Chief Complaint   Chief Complaint  Patient presents with   New Patient (Initial Visit)    Pt here to schedule colonoscopy. Pt just received Lubiprostone  in the mail yesterday but hasn't taken it yet     History of Present Illness   Roberto Perel. is a 78 y.o. male presenting today to schedule surveillance colonoscopy due to history of adenomas and FH colon cancer with 3-year-surveillance recommended. Last seen in 2022 and doing well. He also notes history of chronic constipation and GERD.   [facility] A[facility]  [facility] but hasn't [facility] yet. BM usually daily but has to take something each evening such as stool softener or laxative. Less productive stool. No abdominal pain. No unexplained weight loss or lack of appetite. Only pill dysphagia with a large pill he has. No solid food dysphagia. GERD controlled on omeprazole . Dealing with sinus infection currently.   Declining rectal exam. Feels a prolapsing hemorrhoid. Not bothering him. Only issues is having to wipe repetitively.     Colonoscopy Aug 2022 with tubular adenomas and 3 year surveillance recommended. EGD with moderate Shatzki ring, small hiatal hernia, gastritis s/p biopsy, normal duodenum. Negative H.pylori. Positive family history of colon cancer in father.     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HCC)    Rupture of right triceps tendon 09/01/2015    Past Surgical History:  Procedure Laterality Date   BALLOON DILATION N/A 06/26/2021   Procedure: BALLOON DILATION;  Surgeon: Vinetta Greening, DO;  [facility];  Service: Endoscopy;  Laterality: N/A;   COLONOSCOPY WITH PROPOFOL  N/A 07/14/2020   diverticulosis, 5 polyps (the  largest of which was 14 mm in the cecum) and recommended repeat in 1 year (tubular adenomas).    COLONOSCOPY WITH PROPOFOL  N/A 06/26/2021   fair prep, non-bleeding internal hemorrhoids, sigmoid diverticulosis, two 4-7 mm polyps in cecum, one 2 mmp olyp in cecum. 3 year surveillance. Tubular adenoma.   ESOPHAGOGASTRODUODENOSCOPY (EGD) WITH PROPOFOL  N/A 06/26/2021   moderate Shatzki ring, small hiatal hernia, gastritis s/p biopsy, normal duodenum. Negative H.pylori.   KNEE ARTHROPLASTY Left 10/2020   KNEE ARTHROSCOPY Right 1980s   NASAL SINUS SURGERY     POLYPECTOMY  07/14/2020   Procedure: POLYPECTOMY;  Surgeon: Vinetta Greening, DO;  [facility];  Service: Endoscopy;;   POLYPECTOMY  06/26/2021   Procedure: POLYPECTOMY;  Surgeon: Vinetta Greening, DO;  [facility];  Service: Endoscopy;;   PROSTATE SURGERY     TONSILLECTOMY  age 47   TRICEPS TENDON REPAIR Right 09/01/2015   Procedure: RIGHT TRICEPS  REPAIR;  Surgeon: Osa Blase, MD;  Location: [facility];  Service: Orthopedics;  Laterality: Right;    Current Outpatient Medications  Medication Sig Dispense Refill   albuterol  (VENTOLIN  HFA) 108 (90 Base) MCG/ACT inhaler Inhale 2 puffs into the lungs every 6 (six) hours as needed for wheezing or shortness of breath.     ALPRAZolam  (XANAX ) 0.5 MG tablet Take 0.5 mg by mouth at bedtime.     azithromycin  (ZITHROMAX ) 250 MG tablet Take first 2 tablets together, then 1 every day  until finished. 6 tablet 0   benzonatate  (TESSALON ) 100 MG capsule Take 1-2 capsules (100-200 mg total) by mouth 3 (three) times daily as needed for cough. Do not take with alcohol  or while driving or operating heavy machinery.  May cause drowsiness. 30 capsule 0   Calcium Carb-Cholecalciferol (CALCIUM 600 + D PO) Take 1 tablet by mouth daily.     carbamide peroxide (DEBROX) 6.5 % OTIC solution Place 5 drops into the left ear 2 (two) times daily. 15 mL 0   diclofenac Sodium  (VOLTAREN) 1 % GEL Apply 4 g topically 4 (four) times daily.     DULoxetine  (CYMBALTA ) 20 MG capsule Take 20 mg by mouth at bedtime.     ezetimibe  (ZETIA ) 10 MG tablet Take 10 mg by mouth daily.     fluticasone  (FLONASE ) 50 MCG/ACT nasal spray Place 2 sprays into both nostrils daily. 16 g 0   Fluticasone -Salmeterol (ADVAIR) 250-50 MCG/DOSE AEPB Inhale 1 puff into the lungs every 12 (twelve) hours.     hydroxypropyl methylcellulose / hypromellose (ISOPTO TEARS / GONIOVISC) 2.5 % ophthalmic solution Place 1 drop into both eyes as needed for dry eyes.      ipratropium-albuterol  (DUONEB) 0.5-2.5 (3) MG/3ML SOLN Take 3 mLs by nebulization every 6 (six) hours as needed. 360 mL 0   losartan  (COZAAR ) 100 MG tablet Take 100 mg by mouth daily.     metoprolol  tartrate (LOPRESSOR ) 25 MG tablet Take 1 tablet (25 mg total) by mouth 2 (two) times daily. 180 tablet 3   montelukast  (SINGULAIR ) 10 MG tablet Take 10 mg by mouth daily.     Multiple Vitamin (MULTIVITAMIN WITH MINERALS) TABS tablet Take 1 tablet by mouth daily.     niacin 500 MG tablet Take 500 mg by mouth daily.      omeprazole  (PRILOSEC) 20 MG capsule Take 1 capsule (20 mg total) by mouth 2 (two) times daily before a meal. (Patient taking differently: Take 20 mg by mouth daily.) 60 capsule 5   No current facility-administered medications for this visit.    Allergies as of 03/23/2024 - Review Complete 03/23/2024  Allergen Reaction Noted   Penicillins Other (See Comments) 01/27/2012   Shellfish allergy Hives, Shortness Of Breath, and Swelling 10/04/2011   Atorvastatin Other (See Comments) 06/21/2019   Simvastatin  03/29/2019   Codeine Nausea And Vomiting 06/22/2021    Family History  Problem Relation Age of Onset   Colon cancer Father 66   Allergic rhinitis Brother    Allergic rhinitis Brother    Asthma Maternal Uncle    Angioedema Neg Hx    Eczema Neg Hx    Urticaria Neg Hx    Immunodeficiency Neg Hx     Social History    Socioeconomic History   Marital status: Married    Spouse name: Not on file   Number of children: Not on file   Years of education: Not on file   Highest education level: Not on file  Occupational History   Not on file  Tobacco Use   Smoking status: Former    Current packs/day: 0.00    Average packs/day: 3.0 packs/day for 20.0 years (60.0 ttl pk-yrs)    Types: Cigarettes    Start date: 08/30/1963    Quit date: 08/30/1983    Years since quitting: 40.5   Smokeless tobacco: Former    Types: Chew    Quit date: 08/29/1985  Vaping Use   Vaping status: Never Used  Substance and Sexual Activity  Alcohol  use: Yes    Comment: 3 to 5 beers weekly   Drug use: No   Sexual activity: Not on file  Other Topics Concern   Not on file  Social History Narrative   Not on file   Social Drivers of Health   Financial Resource Strain: Not on file  Food Insecurity: No Food Insecurity (12/06/2020)   Received from [facility], [facility]   Hunger Vital Sign    Worried About Running Out of Food in the Last Year: Never true    Ran Out of Food in the Last Year: Never true  Transportation Needs: Not on file  Physical Activity: Not on file  Stress: No Stress Concern Present (10/17/2020)   Received from [facility], [facility] - Occupational Stress Questionnaire    Feeling of Stress : Not at all  Social Connections: Unknown (04/09/2022)   Received from [facility], [facility]   Social Network    Social Network: Not on file  Intimate Partner Violence: Unknown (03/01/2022)   Received from [facility], [facility]   HITS    Physically Hurt: Not on file    Insult or Talk Down To: Not on file    Threaten Physical Harm: Not on file    Scream or Curse: Not on file     Review of Systems   Gen: Denies any fever, chills, fatigue, weight loss, lack of appetite.  CV: Denies chest pain, heart palpitations, peripheral edema, syncope.  Resp:  Denies shortness of breath at rest or with exertion. Denies wheezing or cough.  GI: Denies dysphagia or odynophagia. Denies jaundice, hematemesis, fecal incontinence. GU : Denies urinary burning, urinary frequency, urinary hesitancy MS: Denies joint pain, muscle weakness, cramps, or limitation of movement.  Derm: Denies rash, itching, dry skin Psych: Denies depression, anxiety, memory loss, and confusion Heme: Denies bruising, bleeding, and enlarged lymph nodes.   Physical Exam   BP 102/60   Pulse 73   Temp 98.5 F (36.9 C)   Ht 5\' 10"  (1.778 m)   Wt 213 lb (96.6 kg)   BMI 30.56 kg/m  General:   Alert and oriented. Pleasant and cooperative. Well-nourished and well-developed.  Head:  Normocephalic and atraumatic. Eyes:  Without icterus Ears:  Normal auditory acuity. Lungs:  Clear to auscultation bilaterally.  Heart:  S1, S2 present without murmurs appreciated.  Abdomen:  +BS, soft, non-tender and non-distended. No HSM noted. No guarding or rebound. No masses appreciated.  Rectal:  Deferred  Msk:  Symmetrical without gross deformities. Normal posture. Extremities:  Without edema. Neurologic:  Alert and  oriented x4;  grossly normal neurologically. Skin:  Intact without significant lesions or rashes. Psych:  Alert and cooperative. Normal mood and affect.   Assessment   Roberto Sanchez. is a 78 y.o. male presenting today with history of GERD, chronic constipation, personal hx of colonic adenomas and FH colon cancer, with need to arrange 3 year surveillance colonoscopy.  Hx of adenomas: last colonoscopy 2022. 3 year surveillance recommended now. No concerning lower GI signs/symptoms. Will arrange around May at his request.  GERD: doing well on omeprazole  20 mg daily. No dysphagia. He does note pill dysphagia with only one large pill but no solid food dysphagia. Prior dilation in 2022 with good results.   Constipation: chronic. Linzess  caused diarrhea persistently. He was  prescribed Amitiza  24 mcg [facility] and will be starting this soon.   As  we do not prescribe anything for him, we can see him back as needed following colonoscopy.       PLAN   Proceed with colonoscopy by Dr. Mordechai April  in near future: the risks, benefits, and alternatives have been discussed with the patient in detail. The patient states understanding and desires to proceed. ASA 2  Agree with starting Amitiza  24 mcg po BID, [facility]  Continue omeprazole  20 mg daily  [facility], we can see prn following colonoscopy unless clinical changes  Delman Ferns, PhD, ANP-BC [facility]

## 2024-03-23 NOTE — H&P (View-Only) (Signed)
 Gastroenterology Office Note    Referring Provider: [facility] Primary Care Physician:  [facility]     Chief Complaint   Chief Complaint  Patient presents with   New Patient (Initial Visit)    Pt here to schedule colonoscopy. Pt just received Lubiprostone  in the mail yesterday but hasn't taken it yet     History of Present Illness   Roberto Perel. is a 78 y.o. male presenting today to schedule surveillance colonoscopy due to history of adenomas and FH colon cancer with 3-year-surveillance recommended. Last seen in 2022 and doing well. He also notes history of chronic constipation and GERD.   He was just given Amitiza  24 mcg [facility] but hasn't started yet. BM usually daily but has to take something each evening such as stool softener or laxative. Less productive stool. No abdominal pain. No unexplained weight loss or lack of appetite. Only pill dysphagia with a large pill he has. No solid food dysphagia. GERD controlled on omeprazole . Dealing with sinus infection currently.   Declining rectal exam. Feels a prolapsing hemorrhoid. Not bothering him. Only issues is having to wipe repetitively.     Colonoscopy Aug 2022 with tubular adenomas and 3 year surveillance recommended. EGD with moderate Shatzki ring, small hiatal hernia, gastritis s/p biopsy, normal duodenum. Negative H.pylori. Positive family history of colon cancer in father.     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HCC)    Rupture of right triceps tendon 09/01/2015    Past Surgical History:  Procedure Laterality Date   BALLOON DILATION N/A 06/26/2021   Procedure: BALLOON DILATION;  Surgeon: Vinetta Greening, DO;  [facility];  Service: Endoscopy;  Laterality: N/A;   COLONOSCOPY WITH PROPOFOL  N/A 07/14/2020   diverticulosis, 5 polyps (the  largest of which was 14 mm in the cecum) and recommended repeat in 1 year (tubular adenomas).    COLONOSCOPY WITH PROPOFOL  N/A 06/26/2021   fair prep, non-bleeding internal hemorrhoids, sigmoid diverticulosis, two 4-7 mm polyps in cecum, one 2 mmp olyp in cecum. 3 year surveillance. Tubular adenoma.   ESOPHAGOGASTRODUODENOSCOPY (EGD) WITH PROPOFOL  N/A 06/26/2021   moderate Shatzki ring, small hiatal hernia, gastritis s/p biopsy, normal duodenum. Negative H.pylori.   KNEE ARTHROPLASTY Left 10/2020   KNEE ARTHROSCOPY Right 1980s   NASAL SINUS SURGERY     POLYPECTOMY  07/14/2020   Procedure: POLYPECTOMY;  Surgeon: Vinetta Greening, DO;  [facility];  Service: Endoscopy;;   POLYPECTOMY  06/26/2021   Procedure: POLYPECTOMY;  Surgeon: Vinetta Greening, DO;  [facility];  Service: Endoscopy;;   PROSTATE SURGERY     TONSILLECTOMY  age 47   TRICEPS TENDON REPAIR Right 09/01/2015   Procedure: RIGHT TRICEPS  REPAIR;  Surgeon: Osa Blase, MD;  Location: [facility];  Service: Orthopedics;  Laterality: Right;    Current Outpatient Medications  Medication Sig Dispense Refill   albuterol  (VENTOLIN  HFA) 108 (90 Base) MCG/ACT inhaler Inhale 2 puffs into the lungs every 6 (six) hours as needed for wheezing or shortness of breath.     ALPRAZolam  (XANAX ) 0.5 MG tablet Take 0.5 mg by mouth at bedtime.     azithromycin  (ZITHROMAX ) 250 MG tablet Take first 2 tablets together, then 1 every day  until finished. 6 tablet 0   benzonatate  (TESSALON ) 100 MG capsule Take 1-2 capsules (100-200 mg total) by mouth 3 (three) times daily as needed for cough. Do not take with alcohol  or while driving or operating heavy machinery.  May cause drowsiness. 30 capsule 0   Calcium Carb-Cholecalciferol (CALCIUM 600 + D PO) Take 1 tablet by mouth daily.     carbamide peroxide (DEBROX) 6.5 % OTIC solution Place 5 drops into the left ear 2 (two) times daily. 15 mL 0   diclofenac Sodium  (VOLTAREN) 1 % GEL Apply 4 g topically 4 (four) times daily.     DULoxetine  (CYMBALTA ) 20 MG capsule Take 20 mg by mouth at bedtime.     ezetimibe  (ZETIA ) 10 MG tablet Take 10 mg by mouth daily.     fluticasone  (FLONASE ) 50 MCG/ACT nasal spray Place 2 sprays into both nostrils daily. 16 g 0   Fluticasone -Salmeterol (ADVAIR) 250-50 MCG/DOSE AEPB Inhale 1 puff into the lungs every 12 (twelve) hours.     hydroxypropyl methylcellulose / hypromellose (ISOPTO TEARS / GONIOVISC) 2.5 % ophthalmic solution Place 1 drop into both eyes as needed for dry eyes.      ipratropium-albuterol  (DUONEB) 0.5-2.5 (3) MG/3ML SOLN Take 3 mLs by nebulization every 6 (six) hours as needed. 360 mL 0   losartan  (COZAAR ) 100 MG tablet Take 100 mg by mouth daily.     metoprolol  tartrate (LOPRESSOR ) 25 MG tablet Take 1 tablet (25 mg total) by mouth 2 (two) times daily. 180 tablet 3   montelukast  (SINGULAIR ) 10 MG tablet Take 10 mg by mouth daily.     Multiple Vitamin (MULTIVITAMIN WITH MINERALS) TABS tablet Take 1 tablet by mouth daily.     niacin 500 MG tablet Take 500 mg by mouth daily.      omeprazole  (PRILOSEC) 20 MG capsule Take 1 capsule (20 mg total) by mouth 2 (two) times daily before a meal. (Patient taking differently: Take 20 mg by mouth daily.) 60 capsule 5   No current facility-administered medications for this visit.    Allergies as of 03/23/2024 - Review Complete 03/23/2024  Allergen Reaction Noted   Penicillins Other (See Comments) 01/27/2012   Shellfish allergy Hives, Shortness Of Breath, and Swelling 10/04/2011   Atorvastatin Other (See Comments) 06/21/2019   Simvastatin  03/29/2019   Codeine Nausea And Vomiting 06/22/2021    Family History  Problem Relation Age of Onset   Colon cancer Father 66   Allergic rhinitis Brother    Allergic rhinitis Brother    Asthma Maternal Uncle    Angioedema Neg Hx    Eczema Neg Hx    Urticaria Neg Hx    Immunodeficiency Neg Hx     Social History    Socioeconomic History   Marital status: Married    Spouse name: Not on file   Number of children: Not on file   Years of education: Not on file   Highest education level: Not on file  Occupational History   Not on file  Tobacco Use   Smoking status: Former    Current packs/day: 0.00    Average packs/day: 3.0 packs/day for 20.0 years (60.0 ttl pk-yrs)    Types: Cigarettes    Start date: 08/30/1963    Quit date: 08/30/1983    Years since quitting: 40.5   Smokeless tobacco: Former    Types: Chew    Quit date: 08/29/1985  Vaping Use   Vaping status: Never Used  Substance and Sexual Activity  Alcohol  use: Yes    Comment: 3 to 5 beers weekly   Drug use: No   Sexual activity: Not on file  Other Topics Concern   Not on file  Social History Narrative   Not on file   Social Drivers of Health   Financial Resource Strain: Not on file  Food Insecurity: No Food Insecurity (12/06/2020)   Received from [facility], [facility]   Hunger Vital Sign    Worried About Running Out of Food in the Last Year: Never true    Ran Out of Food in the Last Year: Never true  Transportation Needs: Not on file  Physical Activity: Not on file  Stress: No Stress Concern Present (10/17/2020)   Received from [facility], [facility] - Occupational Stress Questionnaire    Feeling of Stress : Not at all  Social Connections: Unknown (04/09/2022)   Received from [facility], [facility]   Social Network    Social Network: Not on file  Intimate Partner Violence: Unknown (03/01/2022)   Received from [facility], [facility]   HITS    Physically Hurt: Not on file    Insult or Talk Down To: Not on file    Threaten Physical Harm: Not on file    Scream or Curse: Not on file     Review of Systems   Gen: Denies any fever, chills, fatigue, weight loss, lack of appetite.  CV: Denies chest pain, heart palpitations, peripheral edema, syncope.  Resp:  Denies shortness of breath at rest or with exertion. Denies wheezing or cough.  GI: Denies dysphagia or odynophagia. Denies jaundice, hematemesis, fecal incontinence. GU : Denies urinary burning, urinary frequency, urinary hesitancy MS: Denies joint pain, muscle weakness, cramps, or limitation of movement.  Derm: Denies rash, itching, dry skin Psych: Denies depression, anxiety, memory loss, and confusion Heme: Denies bruising, bleeding, and enlarged lymph nodes.   Physical Exam   BP 102/60   Pulse 73   Temp 98.5 F (36.9 C)   Ht 5\' 10"  (1.778 m)   Wt 213 lb (96.6 kg)   BMI 30.56 kg/m  General:   Alert and oriented. Pleasant and cooperative. Well-nourished and well-developed.  Head:  Normocephalic and atraumatic. Eyes:  Without icterus Ears:  Normal auditory acuity. Lungs:  Clear to auscultation bilaterally.  Heart:  S1, S2 present without murmurs appreciated.  Abdomen:  +BS, soft, non-tender and non-distended. No HSM noted. No guarding or rebound. No masses appreciated.  Rectal:  Deferred  Msk:  Symmetrical without gross deformities. Normal posture. Extremities:  Without edema. Neurologic:  Alert and  oriented x4;  grossly normal neurologically. Skin:  Intact without significant lesions or rashes. Psych:  Alert and cooperative. Normal mood and affect.   Assessment   Roberto Sanchez. is a 78 y.o. male presenting today with history of GERD, chronic constipation, personal hx of colonic adenomas and FH colon cancer, with need to arrange 3 year surveillance colonoscopy.  Hx of adenomas: last colonoscopy 2022. 3 year surveillance recommended now. No concerning lower GI signs/symptoms. Will arrange around May at his request.  GERD: doing well on omeprazole  20 mg daily. No dysphagia. He does note pill dysphagia with only one large pill but no solid food dysphagia. Prior dilation in 2022 with good results.   Constipation: chronic. Linzess  caused diarrhea persistently. He was  prescribed Amitiza  24 mcg po BID [facility] and will be starting this soon.   As  we do not prescribe anything for him, we can see him back as needed following colonoscopy.       PLAN   Proceed with colonoscopy by Dr. Mordechai April  in near future: the risks, benefits, and alternatives have been discussed with the patient in detail. The patient states understanding and desires to proceed. ASA 2  Agree with starting Amitiza  24 mcg po BID, [facility]  Continue omeprazole  20 mg daily  As [facility], we can see prn following colonoscopy unless clinical changes  Delman Ferns, PhD, ANP-BC [facility]

## 2024-03-23 NOTE — Patient Instructions (Signed)
 We are arranging a colonoscopy with Dr. Mordechai April in the near future.  Make sure to take Amitiza  with food to avoid nausea. You can take this once a day if you find it more helpful, but the most is twice a day.   Further recommendations to follow!  I enjoyed seeing you again today! I value our relationship and want to provide genuine, compassionate, and quality care. You may receive a survey regarding your visit with me, and I welcome your feedback! Thanks so much for taking the time to complete this. I look forward to seeing you again.      Delman Ferns, PhD, ANP-BC [facility]

## 2024-04-13 ENCOUNTER — Ambulatory Visit (HOSPITAL_BASED_OUTPATIENT_CLINIC_OR_DEPARTMENT_OTHER)

## 2024-04-13 ENCOUNTER — Other Ambulatory Visit: Payer: Self-pay

## 2024-04-13 ENCOUNTER — Ambulatory Visit (HOSPITAL_COMMUNITY)
Admission: RE | Admit: 2024-04-13 | Discharge: 2024-04-13 | Disposition: A | Discharge status: Home / Self Care | Attending: Internal Medicine | Admitting: Internal Medicine

## 2024-04-13 ENCOUNTER — Ambulatory Visit (HOSPITAL_COMMUNITY)

## 2024-04-13 ENCOUNTER — Encounter (HOSPITAL_COMMUNITY): Payer: Self-pay | Admitting: Internal Medicine

## 2024-04-13 ENCOUNTER — Encounter (HOSPITAL_COMMUNITY)
Admission: RE | Disposition: A | Discharge status: Home / Self Care | Payer: Self-pay | Source: Home / Self Care | Attending: Internal Medicine

## 2024-04-13 DIAGNOSIS — K449 Diaphragmatic hernia without obstruction or gangrene: Secondary | ICD-10-CM | POA: Diagnosis not present

## 2024-04-13 DIAGNOSIS — Z1211 Encounter for screening for malignant neoplasm of colon: Secondary | ICD-10-CM | POA: Diagnosis present

## 2024-04-13 DIAGNOSIS — Z79899 Other long term (current) drug therapy: Secondary | ICD-10-CM | POA: Diagnosis not present

## 2024-04-13 DIAGNOSIS — D122 Benign neoplasm of ascending colon: Secondary | ICD-10-CM | POA: Diagnosis not present

## 2024-04-13 DIAGNOSIS — K5730 Diverticulosis of large intestine without perforation or abscess without bleeding: Secondary | ICD-10-CM

## 2024-04-13 DIAGNOSIS — D124 Benign neoplasm of descending colon: Secondary | ICD-10-CM | POA: Diagnosis not present

## 2024-04-13 DIAGNOSIS — Z800 Family history of malignant neoplasm of digestive organs: Secondary | ICD-10-CM | POA: Insufficient documentation

## 2024-04-13 DIAGNOSIS — K219 Gastro-esophageal reflux disease without esophagitis: Secondary | ICD-10-CM | POA: Insufficient documentation

## 2024-04-13 DIAGNOSIS — K648 Other hemorrhoids: Secondary | ICD-10-CM | POA: Diagnosis not present

## 2024-04-13 DIAGNOSIS — K5909 Other constipation: Secondary | ICD-10-CM | POA: Insufficient documentation

## 2024-04-13 DIAGNOSIS — I10 Essential (primary) hypertension: Secondary | ICD-10-CM | POA: Diagnosis not present

## 2024-04-13 DIAGNOSIS — R1310 Dysphagia, unspecified: Secondary | ICD-10-CM | POA: Insufficient documentation

## 2024-04-13 HISTORY — PX: COLONOSCOPY: SHX5424

## 2024-04-13 SURGERY — COLONOSCOPY
Anesthesia: General

## 2024-04-13 MED ORDER — PROPOFOL 10 MG/ML IV BOLUS
INTRAVENOUS | Status: DC | PRN
  Administered 2024-04-13 (×3): 50 mg via INTRAVENOUS
  Administered 2024-04-13: 100 mg via INTRAVENOUS

## 2024-04-13 MED ORDER — PROPOFOL 500 MG/50ML IV EMUL
INTRAVENOUS | Status: DC | PRN
  Administered 2024-04-13: 150 ug/kg/min via INTRAVENOUS

## 2024-04-13 MED ORDER — LACTATED RINGERS IV SOLN: INTRAVENOUS | Status: DC | PRN

## 2024-04-13 MED ORDER — LACTATED RINGERS IV SOLN: INTRAVENOUS | Status: DC

## 2024-04-13 MED ORDER — LIDOCAINE 2% (20 MG/ML) 5 ML SYRINGE
INTRAMUSCULAR | Status: DC | PRN
  Administered 2024-04-13: 100 mg via INTRAVENOUS

## 2024-04-13 NOTE — Discharge Instructions (Addendum)
  Colonoscopy Discharge Instructions  Read the instructions outlined below and refer to this sheet in the next few weeks. These discharge instructions provide you with general information on caring for yourself after you leave the hospital. Your doctor may also give you specific instructions. While your treatment has been planned according to the most current medical practices available, unavoidable complications occasionally occur.   ACTIVITY You may resume your regular activity, but move at a slower pace for the next 24 hours.  Take frequent rest periods for the next 24 hours.  Walking will help get rid of the air and reduce the bloated feeling in your belly (abdomen).  No driving for 24 hours (because of the medicine (anesthesia) used during the test).   Do not sign any important legal documents or operate any machinery for 24 hours (because of the anesthesia used during the test).  NUTRITION Drink plenty of fluids.  You may resume your normal diet as instructed by your doctor.  Begin with a light meal and progress to your normal diet. Heavy or fried foods are harder to digest and may make you feel sick to your stomach (nauseated).  Avoid alcoholic beverages for 24 hours or as instructed.  MEDICATIONS You may resume your normal medications unless your doctor tells you otherwise.  WHAT YOU CAN EXPECT TODAY Some feelings of bloating in the abdomen.  Passage of more gas than usual.  Spotting of blood in your stool or on the toilet paper.  IF YOU HAD POLYPS REMOVED DURING THE COLONOSCOPY: No aspirin  products for 7 days or as instructed.  No alcohol  for 7 days or as instructed.  Eat a soft diet for the next 24 hours.  FINDING OUT THE RESULTS OF YOUR TEST Not all test results are available during your visit. If your test results are not back during the visit, make an appointment with your caregiver to find out the results. Do not assume everything is normal if you have not heard from your  caregiver or the medical facility. It is important for you to follow up on all of your test results.  SEEK IMMEDIATE MEDICAL ATTENTION IF: You have more than a spotting of blood in your stool.  Your belly is swollen (abdominal distention).  You are nauseated or vomiting.  You have a temperature over 101.  You have abdominal pain or discomfort that is severe or gets worse throughout the day.   Your colonoscopy revealed 2 polyp(s) which I removed successfully. Await pathology results, my office will contact you. Given your age, I do not think we need to perform further colonoscopies for surveillance purposes.  You also have diverticulosis and internal hemorrhoids. I would recommend increasing fiber in your diet or adding OTC Benefiber/Metamucil. Be sure to drink at least 4 to 6 glasses of water  daily. Follow-up with GI as needed.   I hope you have a great rest of your week!  Rolando Cliche. Mordechai April, D.O. Gastroenterology and Hepatology [facility]

## 2024-04-13 NOTE — Interval H&P Note (Signed)
 History and Physical Interval Note:  04/13/2024 8:55 AM  Roberto Sanchez.  has presented today for surgery, with the diagnosis of fh colon cancer, h/o colon polyps.  The various methods of treatment have been discussed with the patient and family. After consideration of risks, benefits and other options for treatment, the patient has consented to  Procedure(s) with comments: COLONOSCOPY (N/A) - 10:00am, asa 2 as a surgical intervention.  The patient's history has been reviewed, patient examined, no change in status, stable for surgery.  I have reviewed the patient's chart and labs.  Questions were answered to the patient's satisfaction.     Vinetta Greening

## 2024-04-13 NOTE — Anesthesia Postprocedure Evaluation (Signed)
 Anesthesia Post Note  Patient: Roberto Sanchez.  Procedure(s) Performed: COLONOSCOPY  Patient location during evaluation: PACU Anesthesia Type: General Level of consciousness: awake and alert Pain management: pain level controlled Vital Signs Assessment: post-procedure vital signs reviewed and stable Respiratory status: spontaneous breathing, nonlabored ventilation, respiratory function stable and patient connected to nasal cannula oxygen  Cardiovascular status: stable and blood pressure returned to baseline Postop Assessment: no apparent nausea or vomiting Anesthetic complications: no  No notable events documented.   Last Vitals:  Vitals:   04/13/24 0847 04/13/24 0941  BP: (!) 161/65 (!) 128/52  Pulse: 71 81  Resp: 13 10  Temp: 36.7 C 36.5 C  SpO2: 98% 94%    Last Pain:  Vitals:   04/13/24 0941  TempSrc: Oral  PainSc: 0-No pain                 Beacher Limerick

## 2024-04-13 NOTE — Transfer of Care (Signed)
 Immediate Anesthesia Transfer of Care Note  Patient: Roberto Sanchez.  Procedure(s) Performed: COLONOSCOPY  Patient Location: Endoscopy Unit  Anesthesia Type:General  Level of Consciousness: drowsy  Airway & Oxygen  Therapy: Patient Spontanous Breathing  Post-op Assessment: Report given to RN and Post -op Vital signs reviewed and stable  Post vital signs: Reviewed and stable  Last Vitals:  Vitals Value Taken Time  BP 128/52 04/13/24 0941  Temp 36.5 C 04/13/24 0941  Pulse 81 04/13/24 0941  Resp 10 04/13/24 0941  SpO2 94 % 04/13/24 0941    Last Pain:  Vitals:   04/13/24 0941  TempSrc: Oral  PainSc: 0-No pain         Complications: No notable events documented.

## 2024-04-13 NOTE — Anesthesia Preprocedure Evaluation (Addendum)
 Anesthesia Evaluation  Patient identified by MRN, date of birth, ID band Patient awake    Reviewed: Allergy & Precautions, H&P , NPO status , Patient's Chart, lab work & pertinent test results  Airway Mallampati: II  TM Distance: >3 FB Neck ROM: Full    Dental no notable dental hx.    Pulmonary asthma , pneumonia, former smoker   Pulmonary exam normal breath sounds clear to auscultation       Cardiovascular hypertension, Normal cardiovascular exam+ dysrhythmias Atrial Fibrillation  Rhythm:Regular Rate:Normal  EF 60-65% 2022   Neuro/Psych   Anxiety     negative neurological ROS     GI/Hepatic Neg liver ROS,GERD  ,,  Endo/Other  negative endocrine ROS    Renal/GU negative Renal ROS  negative genitourinary   Musculoskeletal  (+) Arthritis ,    Abdominal   Peds negative pediatric ROS (+)  Hematology negative hematology ROS (+)   Anesthesia Other Findings   Reproductive/Obstetrics negative OB ROS                             Anesthesia Physical Anesthesia Plan  ASA: 3  Anesthesia Plan: General   Post-op Pain Management:    Induction: Intravenous  PONV Risk Score and Plan:   Airway Management Planned:   Additional Equipment:   Intra-op Plan:   Post-operative Plan:   Informed Consent: I have reviewed the patients History and Physical, chart, labs and discussed the procedure including the risks, benefits and alternatives for the proposed anesthesia with the patient or authorized representative who has indicated his/her understanding and acceptance.     Dental advisory given  Plan Discussed with: CRNA  Anesthesia Plan Comments:        Anesthesia Quick Evaluation

## 2024-04-13 NOTE — Anesthesia Procedure Notes (Signed)
 Date/Time: 04/13/2024 9:12 AM  Performed by: Sherwin Donate, CRNAPre-anesthesia Checklist: Emergency Drugs available, Patient identified, Suction available and Patient being monitored Patient Re-evaluated:Patient Re-evaluated prior to induction Oxygen  Delivery Method: Nasal cannula Induction Type: IV induction Placement Confirmation: positive ETCO2

## 2024-04-13 NOTE — Op Note (Signed)
 [facility] Patient Name: Roberto Sanchez Procedure Date: 04/13/2024 8:56 AM MRN: [id number] Date of Birth: 06/09/1946 Attending MD: Rolando Cliche. Mordechai April , [place], [id number] CSN: [id number] Age: 78 Admit Type: Outpatient Procedure:                Colonoscopy Indications:              Surveillance: Personal history of colonic polyps                            (unknown histology) on last colonoscopy 3 years ago Providers:                Rolando Cliche. Mordechai April, DO, Alisa App, Vonna Guardian Referring MD:              Medicines:                See the Anesthesia note for documentation of the                            administered medications Complications:            No immediate complications. Estimated Blood Loss:     Estimated blood loss was minimal. Procedure:                Pre-Anesthesia Assessment:                           - The anesthesia plan was to use monitored                            anesthesia care (MAC).                           After obtaining informed consent, the colonoscope                            was passed under direct vision. Throughout the                            procedure, the patient's blood pressure, pulse, and                            oxygen  saturations were monitored continuously. The                            [id number]-HQ190L ([id number]) scope was introduced through                            the anus and advanced to the the cecum, identified                            by appendiceal orifice and ileocecal valve. The                            colonoscopy was performed without difficulty. The  patient tolerated the procedure well. The quality                            of the bowel preparation was evaluated using the                            BBPS [facility] Bowel Preparation Scale) with scores                            of: Right Colon = 3, Transverse Colon = 3 and Left                            Colon = 3 (entire mucosa seen well with no  residual                            staining, small fragments of stool or opaque                            liquid). The total BBPS score equals 9. Scope In: 9:20:05 AM Scope Out: 9:37:57 AM Scope Withdrawal Time: 0 hours 14 minutes 37 seconds  Total Procedure Duration: 0 hours 17 minutes 52 seconds  Findings:      Non-bleeding internal hemorrhoids were found.      Multiple large-mouthed and small-mouthed diverticula were found in the       sigmoid colon.      A 6 mm polyp was found in the ascending colon. The polyp was sessile.       The polyp was removed with a cold snare. Resection and retrieval were       complete.      A 5 mm polyp was found in the descending colon. The polyp was sessile.       The polyp was removed with a cold snare. Resection and retrieval were       complete.      The exam was otherwise without abnormality. Impression:               - Non-bleeding internal hemorrhoids.                           - Diverticulosis in the sigmoid colon.                           - One 6 mm polyp in the ascending colon, removed                            with a cold snare. Resected and retrieved.                           - One 5 mm polyp in the descending colon, removed                            with a cold snare. Resected and retrieved.                           - The examination  was otherwise normal. Moderate Sedation:      Per Anesthesia Care Recommendation:           - Patient has a contact number available for                            emergencies. The signs and symptoms of potential                            delayed complications were discussed with the                            patient. Return to normal activities tomorrow.                            Written discharge instructions were provided to the                            patient.                           - Resume previous diet.                           - Continue present medications.                            - Await pathology results.                           - No repeat colonoscopy due to age.                           - Return to GI clinic PRN. Procedure Code(s):        --- Professional ---                           [phone], Colonoscopy, flexible; with removal of                            tumor(s), polyp(s), or other lesion(s) by snare                            technique Diagnosis Code(s):        --- Professional ---                           Z86.010, Personal history of colonic polyps                           K64.8, Other hemorrhoids                           D12.2, Benign neoplasm of ascending colon                           D12.4, Benign neoplasm of descending colon  K57.30, Diverticulosis of large intestine without                            perforation or abscess without bleeding CPT copyright 2022 [employer]. All rights reserved. The codes documented in this report are preliminary and upon coder review may  be revised to meet current compliance requirements. Rolando Cliche. Mordechai April, DO Rolando Cliche. Mordechai April, DO 04/13/2024 9:43:02 AM This report has been signed electronically. Number of Addenda: 0

## 2024-04-14 ENCOUNTER — Encounter (HOSPITAL_COMMUNITY): Payer: Self-pay | Admitting: Internal Medicine

## 2024-04-14 LAB — SURGICAL PATHOLOGY

## 2024-04-16 ENCOUNTER — Ambulatory Visit: Payer: Self-pay | Admitting: Internal Medicine

## 2024-05-05 ENCOUNTER — Ambulatory Visit
Admission: EM | Admit: 2024-05-05 | Discharge: 2024-05-05 | Disposition: A | Discharge status: Home / Self Care | Attending: Nurse Practitioner | Admitting: Nurse Practitioner

## 2024-05-05 ENCOUNTER — Encounter: Payer: Self-pay | Admitting: Emergency Medicine

## 2024-05-05 ENCOUNTER — Other Ambulatory Visit: Payer: Self-pay

## 2024-05-05 DIAGNOSIS — Z8709 Personal history of other diseases of the respiratory system: Secondary | ICD-10-CM

## 2024-05-05 DIAGNOSIS — J0140 Acute pansinusitis, unspecified: Secondary | ICD-10-CM | POA: Diagnosis not present

## 2024-05-05 MED ORDER — DEXAMETHASONE SODIUM PHOSPHATE 10 MG/ML IJ SOLN
10.0000 mg | INTRAMUSCULAR | Status: AC
  Administered 2024-05-05: 10 mg via INTRAMUSCULAR

## 2024-05-05 MED ORDER — DOXYCYCLINE HYCLATE 100 MG PO TABS: 100.0000 mg | ORAL_TABLET | Freq: Two times a day (BID) | ORAL | 0 refills | Status: AC

## 2024-05-05 NOTE — Discharge Instructions (Addendum)
 You were given an injection of Decadron  10mg  today.  Take medication as directed. Continue your current allergy medication regimen. Increase fluids and get plenty of rest. May take over-the-counter Tylenol  as needed for pain, fever, or general discomfort. Recommend normal saline nasal spray to help with nasal congestion throughout the day. For your cough, it may be helpful to use a humidifier at bedtime during sleep. If your symptoms fail to improve within the next 7 to 10 days, please follow-up in our clinic.

## 2024-05-05 NOTE — ED Triage Notes (Addendum)
 Pt reports nasal congestion x2 weeks.  headache x2 days.

## 2024-05-05 NOTE — ED Provider Notes (Signed)
 [facility]    CSN: [id number] Arrival date & time: 05/05/24  1010      History   Chief Complaint No chief complaint on file.   HPI Roberto Sanchez. is a 78 y.o. male.   The history is provided by the patient.   Patient presents with a 2-week history of headache, nasal congestion, and postnasal drainage.  Patient denies fever, chills, ear pain, ear drainage, dizziness, lightheadedness, abdominal pain, nausea, vomiting, diarrhea, or rash.  Patient states he has been taking several over-the-counter cough and cold medications along with using nasal spray with minimal relief of his symptoms.  Patient reports chronic history of sinusitis.  States that he has seen ENT in the past and they recommended surgery for him.  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HCC)    Rupture of right triceps tendon 09/01/2015    Patient Active Problem List   Diagnosis Date Noted   Acute respiratory failure with hypoxia (HCC) 11/01/2021   Atrial fibrillation with RVR (HCC) 10/31/2021   Community acquired pneumonia 10/31/2021   Elevated d-dimer 10/31/2021   Hyperlipidemia 10/31/2021   Severe sepsis (HCC) 10/29/2021   Pill dysphagia 04/05/2021   History of colonic polyps 01/31/2021   Bloating 07/25/2020   GERD (gastroesophageal reflux disease) 07/25/2020   Constipation 04/20/2020   FH: colon cancer 04/20/2020   Rupture of right triceps tendon 09/01/2015   Anxiety 03/20/2015   Asthma, chronic    Vertigo 03/19/2015   Essential hypertension 03/19/2015   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   Malignant neoplasm of prostate (HCC) 02/13/2012   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   Genuine stress incontinence, male 02/13/2012    Past Surgical History:  Procedure Laterality Date   BALLOON DILATION N/A 06/26/2021   Procedure: BALLOON DILATION;   Surgeon: Vinetta Greening, DO;  [facility];  Service: Endoscopy;  Laterality: N/A;   COLONOSCOPY N/A 04/13/2024   Procedure: COLONOSCOPY;  Surgeon: Vinetta Greening, DO;  [facility]: [facility];  Service: Endoscopy;  Laterality: N/A;  10:00am, asa 2   COLONOSCOPY WITH PROPOFOL  N/A 07/14/2020   diverticulosis, 5 polyps (the largest of which was 14 mm in the cecum) and recommended repeat in 1 year (tubular adenomas).    COLONOSCOPY WITH PROPOFOL  N/A 06/26/2021   fair prep, non-bleeding internal hemorrhoids, sigmoid diverticulosis, two 4-7 mm polyps in cecum, one 2 mmp olyp in cecum. 3 year surveillance. Tubular adenoma.   ESOPHAGOGASTRODUODENOSCOPY (EGD) WITH PROPOFOL  N/A 06/26/2021   moderate Shatzki ring, small hiatal hernia, gastritis s/p biopsy, normal duodenum. Negative H.pylori.   KNEE ARTHROPLASTY Left 10/2020   KNEE ARTHROSCOPY Right 1980s   NASAL SINUS SURGERY     POLYPECTOMY  07/14/2020   Procedure: POLYPECTOMY;  Surgeon: Vinetta Greening, DO;  [facility]: [facility];  Service: Endoscopy;;   POLYPECTOMY  06/26/2021   Procedure: POLYPECTOMY;  Surgeon: Vinetta Greening, DO;  [facility]: [facility];  Service: Endoscopy;;   PROSTATE SURGERY     TONSILLECTOMY  age 38   TRICEPS TENDON REPAIR Right 09/01/2015   Procedure: RIGHT TRICEPS  REPAIR;  Surgeon: Osa Blase, MD;  Location: [facility];  Service: Orthopedics;  Laterality: Right;       Home Medications    Prior to Admission medications   Medication Sig Start Date  End Date Taking? Authorizing Provider  doxycycline  (VIBRA -TABS) 100 MG tablet Take 1 tablet (100 mg total) by mouth 2 (two) times daily for 7 days. 05/05/24 05/12/24 Yes Leath-Warren, Belen Bowers, NP  albuterol  (VENTOLIN  HFA) 108 (90 Base) MCG/ACT inhaler Inhale 2 puffs into the lungs every 6 (six) hours as needed for wheezing or shortness of breath. 03/16/08   [provider]  ALPRAZolam  (XANAX ) 0.5 MG tablet Take 0.5 mg by mouth  at bedtime. 10/22/21   [provider]  benzonatate  (TESSALON ) 100 MG capsule Take 1-2 capsules (100-200 mg total) by mouth 3 (three) times daily as needed for cough. Do not take with alcohol  or while driving or operating heavy machinery.  May cause drowsiness. 12/23/23   Wilhemena Harbour, NP  Calcium Carb-Cholecalciferol (CALCIUM 600 + D PO) Take 1 tablet by mouth daily.    [provider]  diclofenac Sodium (VOLTAREN) 1 % GEL Apply 4 g topically 4 (four) times daily. 10/22/21   [provider]  DULoxetine  (CYMBALTA ) 20 MG capsule Take 20 mg by mouth at bedtime.    [provider]  ezetimibe  (ZETIA ) 10 MG tablet Take 10 mg by mouth daily.    [provider]  fluticasone  (FLONASE ) 50 MCG/ACT nasal spray Place 2 sprays into both nostrils daily. 12/01/22   Leath-Warren, Belen Bowers, NP  Fluticasone -Salmeterol (ADVAIR) 250-50 MCG/DOSE AEPB Inhale 1 puff into the lungs every 12 (twelve) hours.    [provider]  hydroxypropyl methylcellulose / hypromellose (ISOPTO TEARS / GONIOVISC) 2.5 % ophthalmic solution Place 1 drop into both eyes as needed for dry eyes.     [provider]  ipratropium-albuterol  (DUONEB) 0.5-2.5 (3) MG/3ML SOLN Take 3 mLs by nebulization every 6 (six) hours as needed. 11/03/21   Verlyn Goad, MD  losartan  (COZAAR ) 100 MG tablet Take 100 mg by mouth daily.    [provider]  metoprolol  tartrate (LOPRESSOR ) 25 MG tablet Take 1 tablet (25 mg total) by mouth 2 (two) times daily. 05/01/22 03/23/24  Laurann Pollock, MD  montelukast  (SINGULAIR ) 10 MG tablet Take 10 mg by mouth daily. 03/18/15   [provider]  Multiple Vitamin (MULTIVITAMIN WITH MINERALS) TABS tablet Take 1 tablet by mouth daily.    [provider]  niacin 500 MG tablet Take 500 mg by mouth daily.     [provider]  omeprazole  (PRILOSEC) 20 MG capsule Take 1 capsule (20 mg total) by mouth 2 (two) times daily before a  meal. Patient taking differently: Take 20 mg by mouth daily. 06/27/21 03/23/24  Lanney Pitts, PA-C    Family History Family History  Problem Relation Age of Onset   Colon cancer Father 24   Allergic rhinitis Brother    Allergic rhinitis Brother    Asthma Maternal Uncle    Angioedema Neg Hx    Eczema Neg Hx    Urticaria Neg Hx    Immunodeficiency Neg Hx     Social History Social History   Tobacco Use   Smoking status: Former    Current packs/day: 0.00    Average packs/day: 3.0 packs/day for 20.0 years (60.0 ttl pk-yrs)    Types: Cigarettes    Start date: 08/30/1963    Quit date: 08/30/1983    Years since quitting: 40.7   Smokeless tobacco: Former    Types: Chew    Quit date: 08/29/1985  Vaping Use   Vaping status: Never Used  Substance Use Topics   Alcohol  use: Yes  Comment: 3 to 5 beers weekly   Drug use: No     Allergies   Penicillins, Shellfish allergy, Atorvastatin, Simvastatin, and Codeine   Review of Systems Review of Systems Per HPI  Physical Exam Triage Vital Signs ED Triage Vitals [05/05/24 1018]  Encounter Vitals Group     BP 134/73     Systolic BP Percentile      Diastolic BP Percentile      Pulse Rate 91     Resp 20     Temp 98.1 F (36.7 C)     Temp Source Oral     SpO2 97 %     Weight      Height      Head Circumference      Peak Flow      Pain Score 9     Pain Loc      Pain Education      Exclude from Growth Chart    No data found.  Updated Vital Signs BP 134/73 (BP Location: Right Arm)   Pulse 91   Temp 98.1 F (36.7 C) (Oral)   Resp 20   SpO2 97%   Visual Acuity Right Eye Distance:   Left Eye Distance:   Bilateral Distance:    Right Eye Near:   Left Eye Near:    Bilateral Near:     Physical Exam Vitals and nursing note reviewed.  Constitutional:      General: He is not in acute distress.    Appearance: Normal appearance.  HENT:     Head: Normocephalic.     Right Ear: Tympanic membrane, ear canal and  external ear normal.     Left Ear: Tympanic membrane, ear canal and external ear normal.     Nose: Congestion present.     Right Turbinates: Enlarged and swollen.     Left Turbinates: Enlarged and swollen.     Right Sinus: Maxillary sinus tenderness and frontal sinus tenderness present.     Left Sinus: Maxillary sinus tenderness and frontal sinus tenderness present.     Mouth/Throat:     Lips: Pink.     Mouth: Mucous membranes are moist.     Pharynx: Oropharynx is clear. Uvula midline. Postnasal drip present. No pharyngeal swelling, oropharyngeal exudate, posterior oropharyngeal erythema or uvula swelling.     Comments: Cobblestoning present to posterior oropharynx  Eyes:     Extraocular Movements: Extraocular movements intact.     Conjunctiva/sclera: Conjunctivae normal.     Pupils: Pupils are equal, round, and reactive to light.  Cardiovascular:     Rate and Rhythm: Normal rate and regular rhythm.     Pulses: Normal pulses.     Heart sounds: Normal heart sounds.  Pulmonary:     Effort: Pulmonary effort is normal. No respiratory distress.     Breath sounds: Normal breath sounds. No stridor. No wheezing, rhonchi or rales.  Abdominal:     General: Bowel sounds are normal.     Palpations: Abdomen is soft.     Tenderness: There is no abdominal tenderness.  Musculoskeletal:     Cervical back: Normal range of motion.  Lymphadenopathy:     Cervical: No cervical adenopathy.  Skin:    General: Skin is warm and dry.  Neurological:     General: No focal deficit present.     Mental Status: He is alert and oriented to person, place, and time.  Psychiatric:        Mood and Affect: Mood normal.  Behavior: Behavior normal.      UC Treatments / Results  Labs (all labs ordered are listed, but only abnormal results are displayed) Labs Reviewed - No data to display  EKG   Radiology No results found.  Procedures Procedures (including critical care time)  Medications Ordered  in UC Medications  dexamethasone  (DECADRON ) injection 10 mg (10 mg Intramuscular Given 05/05/24 1041)    Initial Impression / Assessment and Plan / UC Course  I have reviewed the triage vital signs and the nursing notes.  Pertinent labs & imaging results that were available during my care of the patient were reviewed by me and considered in my medical decision making (see chart for details).  On exam, patient with both maxillary and frontal sinus tenderness.  He does have an underlying history of chronic sinusitis.  Given his underlying history and chronic condition, will treat with Decadron  10 mg IM.  Will start doxycycline  100 mg twice daily for the next 7 days.  Patient advised to continue his current allergy medication and nasal spray.  Supportive care recommendations were provided and discussed with the patient to include over-the-counter analgesics, normal saline nasal spray, and use of a humidifier during sleep.  Patient was advised to follow-up with ENT if symptoms fail to improve with this treatment.  Patient was in agreement with this plan of care and verbalizes understanding.  All questions were answered.  Patient stable for discharge.  Final Clinical Impressions(s) / UC Diagnoses   Final diagnoses:  Acute pansinusitis, recurrence not specified  History of chronic sinusitis     Discharge Instructions      You were given an injection of Decadron  10mg  today.  Take medication as directed. Continue your current allergy medication regimen. Increase fluids and get plenty of rest. May take over-the-counter Tylenol  as needed for pain, fever, or general discomfort. Recommend normal saline nasal spray to help with nasal congestion throughout the day. For your cough, it may be helpful to use a humidifier at bedtime during sleep. If your symptoms fail to improve within the next 7 to 10 days, please follow-up in our clinic.    ED Prescriptions     Medication Sig Dispense Auth.  Provider   doxycycline  (VIBRA -TABS) 100 MG tablet Take 1 tablet (100 mg total) by mouth 2 (two) times daily for 7 days. 14 tablet Leath-Warren, Belen Bowers, NP      PDMP not reviewed this encounter.   Hardy Lia, NP 05/05/24 1128

## 2024-12-09 ENCOUNTER — Ambulatory Visit (INDEPENDENT_AMBULATORY_CARE_PROVIDER_SITE_OTHER): Admitting: Physician Assistant

## 2024-12-09 ENCOUNTER — Encounter (INDEPENDENT_AMBULATORY_CARE_PROVIDER_SITE_OTHER): Payer: Self-pay | Admitting: Physician Assistant

## 2024-12-09 VITALS — BP 133/76 | HR 79 | Ht 70.0 in | Wt 212.0 lb

## 2024-12-09 DIAGNOSIS — J329 Chronic sinusitis, unspecified: Secondary | ICD-10-CM

## 2024-12-09 DIAGNOSIS — J342 Deviated nasal septum: Secondary | ICD-10-CM

## 2024-12-09 MED ORDER — AZELASTINE HCL 0.1 % NA SOLN: 2.0000 | Freq: Two times a day (BID) | NASAL | 12 refills | Status: AC

## 2024-12-09 NOTE — Patient Instructions (Signed)
 I have ordered an imaging study for you to complete prior to your next visit. Please call [facility] Scheduling at [phone] to schedule your imaging if you have not received a call within 24 hours. If you are unable to complete your imaging study prior to your next scheduled visit please call our office to let us  know.

## 2024-12-10 NOTE — Progress Notes (Signed)
 Dear Dr. Celena, Here is my assessment for our mutual patient, Roberto Sanchez. Thank you for allowing me the opportunity to care for your patient. Please do not hesitate to contact me should you have any other questions. Sincerely, Chyrl Cohen PA-C  Otolaryngology Clinic Note Referring provider: Dr. Celena HPI:  Roberto Sanchez is a 79 y.o. male kindly referred by Dr. Celena   Discussed the use of AI scribe software for clinical note transcription with the patient, who gave verbal consent to proceed.  History of Present Illness    Roberto Sanchez. is a 79 year old male with chronic sinusitis and deviated nasal septum who presents for evaluation of persistent nasal congestion and sinus headaches refractory to medical therapy.  For the past 6 to 8 months, he has experienced constant nasal congestion, daily headaches upon waking, and a persistent sensation of nasal obstruction. Symptoms have not responded to over-the-counter antihistamines, which provide only minimal and transient relief. He also reports intermittent ocular symptoms and a sensation of low intranasal pressure. Headaches are occasionally severe and have persisted throughout the year.  He has completed at least five courses of antibiotics for sinus symptoms over the past year, with only temporary improvement; symptoms typically recur within two to three weeks after each course. Prednisone  has provided symptomatic relief but is associated with significant side effects, including insomnia and agitation.  He has used Flonase  nasal spray daily for approximately one to two years without significant benefit. He intermittently uses Allegra D, which is more effective than other antihistamines but causes jitteriness. He previously underwent allergy evaluation many years ago, but does not recall the details.  Prior to the last 6 to 8 months, his symptoms were more seasonal and less frequent, managed with occasional antihistamines and nasal  sprays. These interventions have become less effective over time. He notes increased tightness and difficulty breathing through the left nostril.           Independent Review of Additional Tests or Records:  None   PMH/Meds/All/SocHx/FamHx/ROS:   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HCC)    Rupture of right triceps tendon 09/01/2015     Past Surgical History:  Procedure Laterality Date   BALLOON DILATION N/A 06/26/2021   Procedure: BALLOON DILATION;  Surgeon: Cindie Carlin POUR, DO;  [facility];  Service: Endoscopy;  Laterality: N/A;   COLONOSCOPY N/A 04/13/2024   Procedure: COLONOSCOPY;  Surgeon: Cindie Carlin POUR, DO;  [facility];  Service: Endoscopy;  Laterality: N/A;  10:00am, asa 2   COLONOSCOPY WITH PROPOFOL  N/A 07/14/2020   diverticulosis, 5 polyps (the largest of which was 14 mm in the cecum) and recommended repeat in 1 year (tubular adenomas).    COLONOSCOPY WITH PROPOFOL  N/A 06/26/2021   fair prep, non-bleeding internal hemorrhoids, sigmoid diverticulosis, two 4-7 mm polyps in cecum, one 2 mmp olyp in cecum. 3 year surveillance. Tubular adenoma.   ESOPHAGOGASTRODUODENOSCOPY (EGD) WITH PROPOFOL  N/A 06/26/2021   moderate Shatzki ring, small hiatal hernia, gastritis s/p biopsy, normal duodenum. Negative H.pylori.   KNEE ARTHROPLASTY Left 10/2020   KNEE ARTHROSCOPY Right 1980s   NASAL SINUS SURGERY     POLYPECTOMY  07/14/2020   Procedure: POLYPECTOMY;  Surgeon: Cindie Carlin POUR, DO;  [facility];  Service: Endoscopy;;   POLYPECTOMY  06/26/2021   Procedure: POLYPECTOMY;  Surgeon: Cindie Carlin POUR, DO;  [facility];  Service: Endoscopy;;   PROSTATE SURGERY     TONSILLECTOMY  age 10   TRICEPS TENDON REPAIR Right 09/01/2015   Procedure: RIGHT TRICEPS  REPAIR;  Surgeon: Fonda Olmsted, MD;  Location: [facility];  Service: Orthopedics;  Laterality: Right;    Family History  Problem Relation Age of Onset   Colon cancer Father 48   Allergic rhinitis Brother    Allergic rhinitis Brother    Asthma Maternal Uncle    Angioedema Neg Hx    Eczema Neg Hx    Urticaria Neg Hx    Immunodeficiency Neg Hx      Social Connections: Unknown (04/09/2022)   Received from [employer]   Social Network    Social Network: Not on file     Current Medications[1]   Physical Exam:   BP 133/76 (BP Location: Right Arm, Patient Position: Sitting)   Pulse 79   Ht 5' 10 (1.778 m)   Wt 212 lb (96.2 kg)   SpO2 95%   BMI 30.42 kg/m   Pertinent Findings  CN II-XII grossly intact Bilateral EAC clear and TM intact with well pneumatized middle ear spaces Anterior rhinoscopy: Septum left deviation; bilateral inferior turbinates with mild hypertrophy No lesions of oral cavity/oropharynx No obviously palpable neck masses/lymphadenopathy/thyromegaly No respiratory distress or stridor       Seprately Identifiable Procedures:  PROCEDURE: Bilateral Diagnostic Nasal Endoscopy Pre-procedure diagnosis: Concern for chronic nasal congesiton  Post-procedure diagnosis: same Indication: See pre-procedure diagnosis and physical exam above Complications: None apparent EBL: 0 mL Anesthesia: Lidocaine  4% and topical decongestant was topically sprayed in each nasal cavity  Description of Procedure:  Patient was identified. A rigid 30 degree endoscope was utilized to evaluate the sinonasal cavities, mucosa, sinus ostia and turbinates and septum.  Overall, signs of mucosal inflammation are noted.  Also noted are mild hypertrophy.  No mucopurulence, polyps, or masses noted.   Right Middle meatus: clear Right SE Recess: clear Left MM: clear Left SE Recess: clear Photodocumentation was obtained.  CPT CODE -- 68768 - Mod 25   Impression & Plans:  Roberto Sanchez is a 79 y.o. male with the following   Assessment and  Plan    Chronic sinusitis Chronic sinusitis with persistent symptoms unresponsive to antibiotics, intranasal corticosteroids, and oral antihistamines. No polyps or significant mucosal edema on nasal endoscopy. Further imaging needed to assess chronic sinus disease. Surgery considered last resort due to age-related risks. - Ordered CT scan of the sinuses to assess for chronic sinus disease and guide further management. - Advised continuation of daily intranasal corticosteroid (Flonase ). - Advised continuation of oral antihistamines (Claritin, Allegra, or Allegra D) as tolerated. - Prescribed azelastine  nasal spray and sent prescription to [facility] pharmacy. - Arranged follow-up phone visit to discuss CT results and further management.  Deviated nasal septum Left septal deviation causing increased resistance and difficulty breathing. Chronic condition not requiring urgent intervention. Surgery deferred due to age-related risk; reassessment after CT results and medical therapy evaluation. - Documented left septal deviation on nasal endoscopy. - Discussed that surgery is a last resort given age and increased surgical risk. - Plan to reassess need for surgical intervention after CT results and evaluation of response to medical therapy.           - f/u Phone office visit with CT results   Thank you for allowing me the opportunity to care for your patient. Please do not  hesitate to contact me should you have any other questions.  Sincerely, Chyrl Cohen PA-C [facility] Phone: [phone] Fax: [phone]  12/10/2024, 12:19 PM        [1]  Current Outpatient Medications:    albuterol  (VENTOLIN  HFA) 108 (90 Base) MCG/ACT inhaler, Inhale 2 puffs into the lungs every 6 (six) hours as needed for wheezing or shortness of breath., Disp: , Rfl:    ALPRAZolam  (XANAX ) 0.5 MG tablet, Take 0.5 mg by mouth at bedtime., Disp: , Rfl:    azelastine  (ASTELIN ) 0.1 % nasal spray, Place 2  sprays into both nostrils 2 (two) times daily. Use in each nostril as directed, Disp: 30 mL, Rfl: 12   benzonatate  (TESSALON ) 100 MG capsule, Take 1-2 capsules (100-200 mg total) by mouth 3 (three) times daily as needed for cough. Do not take with alcohol  or while driving or operating heavy machinery.  May cause drowsiness., Disp: 30 capsule, Rfl: 0   Calcium Carb-Cholecalciferol (CALCIUM 600 + D PO), Take 1 tablet by mouth daily., Disp: , Rfl:    diclofenac Sodium (VOLTAREN) 1 % GEL, Apply 4 g topically 4 (four) times daily., Disp: , Rfl:    DULoxetine  (CYMBALTA ) 20 MG capsule, Take 20 mg by mouth at bedtime., Disp: , Rfl:    ezetimibe  (ZETIA ) 10 MG tablet, Take 10 mg by mouth daily., Disp: , Rfl:    fluticasone  (FLONASE ) 50 MCG/ACT nasal spray, Place 2 sprays into both nostrils daily., Disp: 16 g, Rfl: 0   Fluticasone -Salmeterol (ADVAIR) 250-50 MCG/DOSE AEPB, Inhale 1 puff into the lungs every 12 (twelve) hours., Disp: , Rfl:    hydroxypropyl methylcellulose / hypromellose (ISOPTO TEARS / GONIOVISC) 2.5 % ophthalmic solution, Place 1 drop into both eyes as needed for dry eyes. , Disp: , Rfl:    ipratropium-albuterol  (DUONEB) 0.5-2.5 (3) MG/3ML SOLN, Take 3 mLs by nebulization every 6 (six) hours as needed., Disp: 360 mL, Rfl: 0   losartan  (COZAAR ) 100 MG tablet, Take 100 mg by mouth daily., Disp: , Rfl:    montelukast  (SINGULAIR ) 10 MG tablet, Take 10 mg by mouth daily., Disp: , Rfl:    Multiple Vitamin (MULTIVITAMIN WITH MINERALS) TABS tablet, Take 1 tablet by mouth daily., Disp: , Rfl:    niacin 500 MG tablet, Take 500 mg by mouth daily. , Disp: , Rfl:    metoprolol  tartrate (LOPRESSOR ) 25 MG tablet, Take 1 tablet (25 mg total) by mouth 2 (two) times daily., Disp: 180 tablet, Rfl: 3   omeprazole  (PRILOSEC) 20 MG capsule, Take 1 capsule (20 mg total) by mouth 2 (two) times daily before a meal. (Patient taking differently: Take 20 mg by mouth daily.), Disp: 60 capsule, Rfl: 5

## 2024-12-16 ENCOUNTER — Ambulatory Visit (HOSPITAL_COMMUNITY)
Admission: RE | Admit: 2024-12-16 | Discharge: 2024-12-16 | Disposition: A | Discharge status: Home / Self Care | Source: Ambulatory Visit | Attending: Physician Assistant | Admitting: Physician Assistant

## 2024-12-16 DIAGNOSIS — J329 Chronic sinusitis, unspecified: Secondary | ICD-10-CM | POA: Diagnosis present

## 2024-12-21 ENCOUNTER — Ambulatory Visit (INDEPENDENT_AMBULATORY_CARE_PROVIDER_SITE_OTHER): Payer: Self-pay | Admitting: Physician Assistant

## 2024-12-21 NOTE — Progress Notes (Signed)
 Can we schedule office phone visit to discuss results

## 2024-12-22 ENCOUNTER — Ambulatory Visit (INDEPENDENT_AMBULATORY_CARE_PROVIDER_SITE_OTHER): Admitting: Physician Assistant

## 2024-12-22 DIAGNOSIS — J329 Chronic sinusitis, unspecified: Secondary | ICD-10-CM

## 2024-12-22 DIAGNOSIS — J302 Other seasonal allergic rhinitis: Secondary | ICD-10-CM
